# Patient Record
Sex: Male | Born: 1945 | Race: Black or African American | Hispanic: No | Smoking: Former smoker
Health system: Southern US, Community
[De-identification: ages and names within clinical notes are randomized; demographics above are authoritative.]

## PROBLEM LIST (undated history)

## (undated) DIAGNOSIS — I10 Essential (primary) hypertension: Secondary | ICD-10-CM

## (undated) DIAGNOSIS — G609 Hereditary and idiopathic neuropathy, unspecified: Secondary | ICD-10-CM

## (undated) DIAGNOSIS — M545 Low back pain, unspecified: Secondary | ICD-10-CM

## (undated) DIAGNOSIS — R413 Other amnesia: Secondary | ICD-10-CM

## (undated) DIAGNOSIS — E785 Hyperlipidemia, unspecified: Secondary | ICD-10-CM

## (undated) DIAGNOSIS — K219 Gastro-esophageal reflux disease without esophagitis: Secondary | ICD-10-CM

## (undated) DIAGNOSIS — IMO0001 Reserved for inherently not codable concepts without codable children: Secondary | ICD-10-CM

## (undated) DIAGNOSIS — M19019 Primary osteoarthritis, unspecified shoulder: Secondary | ICD-10-CM

## (undated) DIAGNOSIS — E559 Vitamin D deficiency, unspecified: Secondary | ICD-10-CM

## (undated) DIAGNOSIS — E1165 Type 2 diabetes mellitus with hyperglycemia: Secondary | ICD-10-CM

## (undated) DIAGNOSIS — Z8739 Personal history of other diseases of the musculoskeletal system and connective tissue: Secondary | ICD-10-CM

## (undated) HISTORY — DX: Type 2 diabetes mellitus with hyperglycemia: E11.65

## (undated) HISTORY — DX: Other amnesia: R41.3

## (undated) HISTORY — DX: Low back pain: M54.5

## (undated) HISTORY — DX: Low back pain, unspecified: M54.50

## (undated) HISTORY — DX: Hereditary and idiopathic neuropathy, unspecified: G60.9

## (undated) HISTORY — DX: Primary osteoarthritis, unspecified shoulder: M19.019

## (undated) HISTORY — DX: Reserved for inherently not codable concepts without codable children: IMO0001

## (undated) HISTORY — DX: Hyperlipidemia, unspecified: E78.5

## (undated) HISTORY — DX: Vitamin D deficiency, unspecified: E55.9

## (undated) HISTORY — DX: Gastro-esophageal reflux disease without esophagitis: K21.9

## (undated) HISTORY — DX: Essential (primary) hypertension: I10

---

## 2003-01-25 HISTORY — PX: COLONOSCOPY: SHX174

## 2009-12-15 ENCOUNTER — Emergency Department (HOSPITAL_COMMUNITY)
Admission: EM | Admit: 2009-12-15 | Discharge: 2009-12-15 | Payer: Self-pay | Source: Home / Self Care | Admitting: Emergency Medicine

## 2010-01-24 HISTORY — PX: COLONOSCOPY W/ POLYPECTOMY: SHX1380

## 2011-01-25 HISTORY — PX: KIDNEY STONE SURGERY: SHX686

## 2011-02-15 DIAGNOSIS — B353 Tinea pedis: Secondary | ICD-10-CM | POA: Diagnosis not present

## 2011-02-15 DIAGNOSIS — Z8042 Family history of malignant neoplasm of prostate: Secondary | ICD-10-CM | POA: Diagnosis not present

## 2011-02-15 DIAGNOSIS — Z0000 Encounter for general adult medical examination without abnormal findings: Secondary | ICD-10-CM | POA: Diagnosis not present

## 2011-02-15 DIAGNOSIS — E785 Hyperlipidemia, unspecified: Secondary | ICD-10-CM | POA: Diagnosis not present

## 2011-02-15 DIAGNOSIS — I10 Essential (primary) hypertension: Secondary | ICD-10-CM | POA: Diagnosis not present

## 2011-02-15 DIAGNOSIS — Z79899 Other long term (current) drug therapy: Secondary | ICD-10-CM | POA: Diagnosis not present

## 2011-02-15 DIAGNOSIS — Z86010 Personal history of colonic polyps: Secondary | ICD-10-CM | POA: Diagnosis not present

## 2011-02-15 DIAGNOSIS — E119 Type 2 diabetes mellitus without complications: Secondary | ICD-10-CM | POA: Diagnosis not present

## 2011-02-15 DIAGNOSIS — Z125 Encounter for screening for malignant neoplasm of prostate: Secondary | ICD-10-CM | POA: Diagnosis not present

## 2011-10-11 DIAGNOSIS — I10 Essential (primary) hypertension: Secondary | ICD-10-CM | POA: Diagnosis not present

## 2011-10-11 DIAGNOSIS — E785 Hyperlipidemia, unspecified: Secondary | ICD-10-CM | POA: Diagnosis not present

## 2011-10-11 DIAGNOSIS — M19019 Primary osteoarthritis, unspecified shoulder: Secondary | ICD-10-CM | POA: Diagnosis not present

## 2011-11-08 DIAGNOSIS — E559 Vitamin D deficiency, unspecified: Secondary | ICD-10-CM | POA: Diagnosis not present

## 2011-11-08 DIAGNOSIS — E785 Hyperlipidemia, unspecified: Secondary | ICD-10-CM | POA: Diagnosis not present

## 2011-11-08 DIAGNOSIS — I10 Essential (primary) hypertension: Secondary | ICD-10-CM | POA: Diagnosis not present

## 2011-11-10 DIAGNOSIS — E785 Hyperlipidemia, unspecified: Secondary | ICD-10-CM | POA: Diagnosis not present

## 2011-11-10 DIAGNOSIS — Z23 Encounter for immunization: Secondary | ICD-10-CM | POA: Diagnosis not present

## 2011-11-10 DIAGNOSIS — I10 Essential (primary) hypertension: Secondary | ICD-10-CM | POA: Diagnosis not present

## 2011-11-10 DIAGNOSIS — E559 Vitamin D deficiency, unspecified: Secondary | ICD-10-CM | POA: Diagnosis not present

## 2012-03-28 DIAGNOSIS — I10 Essential (primary) hypertension: Secondary | ICD-10-CM | POA: Diagnosis not present

## 2012-03-28 DIAGNOSIS — M19019 Primary osteoarthritis, unspecified shoulder: Secondary | ICD-10-CM | POA: Diagnosis not present

## 2012-05-13 ENCOUNTER — Emergency Department (HOSPITAL_COMMUNITY)
Admission: EM | Admit: 2012-05-13 | Discharge: 2012-05-13 | Discharge status: Against Medical Advice | Payer: Self-pay | Attending: Emergency Medicine | Admitting: Emergency Medicine

## 2012-05-13 DIAGNOSIS — Y939 Activity, unspecified: Secondary | ICD-10-CM | POA: Insufficient documentation

## 2012-05-13 DIAGNOSIS — F1010 Alcohol abuse, uncomplicated: Secondary | ICD-10-CM | POA: Insufficient documentation

## 2012-05-13 DIAGNOSIS — Y9289 Other specified places as the place of occurrence of the external cause: Secondary | ICD-10-CM | POA: Insufficient documentation

## 2012-05-13 DIAGNOSIS — X58XXXA Exposure to other specified factors, initial encounter: Secondary | ICD-10-CM | POA: Insufficient documentation

## 2012-05-13 DIAGNOSIS — S0190XA Unspecified open wound of unspecified part of head, initial encounter: Secondary | ICD-10-CM | POA: Insufficient documentation

## 2012-05-13 MED ORDER — TETANUS-DIPHTH-ACELL PERTUSSIS 5-2.5-18.5 LF-MCG/0.5 IM SUSP
0.5000 mL | Freq: Once | INTRAMUSCULAR | Status: DC
  Filled 2012-05-13: qty 0.5

## 2012-05-13 MED ORDER — ZIPRASIDONE MESYLATE 20 MG IM SOLR
10.0000 mg | Freq: Once | INTRAMUSCULAR | Status: DC
  Filled 2012-05-13 (×2): qty 20

## 2012-05-13 NOTE — ED Notes (Signed)
Per EMS. Patient was found by security in a parking lot. Patient reports to EMS that he was looking for his car and does not recall falling. Patient accompanied by [employer] at this time. Patient A&O by uncooperative. Right side head lac.

## 2012-05-13 NOTE — ED Notes (Signed)
GEX:[id number]<XL> Expected date:<BR> Expected time:<BR> Means of arrival:<BR> Comments:<BR> EMS etoh, head lac

## 2012-05-27 ENCOUNTER — Other Ambulatory Visit: Payer: Self-pay | Admitting: Internal Medicine

## 2012-07-30 ENCOUNTER — Other Ambulatory Visit: Payer: Self-pay | Admitting: *Deleted

## 2012-07-30 ENCOUNTER — Encounter: Payer: Self-pay | Admitting: *Deleted

## 2012-07-31 ENCOUNTER — Ambulatory Visit: Payer: Self-pay | Admitting: Internal Medicine

## 2012-07-31 DIAGNOSIS — Z0289 Encounter for other administrative examinations: Secondary | ICD-10-CM

## 2012-09-12 ENCOUNTER — Other Ambulatory Visit: Payer: Self-pay | Admitting: Internal Medicine

## 2012-10-15 ENCOUNTER — Other Ambulatory Visit: Payer: Self-pay | Admitting: Nurse Practitioner

## 2012-10-24 ENCOUNTER — Other Ambulatory Visit: Payer: Self-pay | Admitting: Internal Medicine

## 2012-11-13 ENCOUNTER — Encounter: Payer: Self-pay | Admitting: Internal Medicine

## 2012-11-13 ENCOUNTER — Ambulatory Visit (INDEPENDENT_AMBULATORY_CARE_PROVIDER_SITE_OTHER): Payer: Medicare Other | Admitting: Internal Medicine

## 2012-11-13 VITALS — BP 126/80 | HR 81 | Temp 99.4°F | Ht 68.03 in | Wt 183.0 lb

## 2012-11-13 DIAGNOSIS — I119 Hypertensive heart disease without heart failure: Secondary | ICD-10-CM

## 2012-11-13 DIAGNOSIS — E1142 Type 2 diabetes mellitus with diabetic polyneuropathy: Secondary | ICD-10-CM | POA: Diagnosis not present

## 2012-11-13 DIAGNOSIS — M1990 Unspecified osteoarthritis, unspecified site: Secondary | ICD-10-CM

## 2012-11-13 DIAGNOSIS — E1149 Type 2 diabetes mellitus with other diabetic neurological complication: Secondary | ICD-10-CM

## 2012-11-13 DIAGNOSIS — E1140 Type 2 diabetes mellitus with diabetic neuropathy, unspecified: Secondary | ICD-10-CM | POA: Insufficient documentation

## 2012-11-13 DIAGNOSIS — E559 Vitamin D deficiency, unspecified: Secondary | ICD-10-CM | POA: Diagnosis not present

## 2012-11-13 DIAGNOSIS — E785 Hyperlipidemia, unspecified: Secondary | ICD-10-CM | POA: Diagnosis not present

## 2012-11-13 DIAGNOSIS — Z23 Encounter for immunization: Secondary | ICD-10-CM | POA: Diagnosis not present

## 2012-11-13 MED ORDER — MELOXICAM 15 MG PO TABS: 15.0000 mg | ORAL_TABLET | Freq: Every day | ORAL | Status: DC

## 2012-11-13 MED ORDER — GABAPENTIN 100 MG PO CAPS: 100.0000 mg | ORAL_CAPSULE | Freq: Every day | ORAL | Status: DC

## 2012-11-13 NOTE — Progress Notes (Signed)
Patient ID: Danny Winters, male   DOB: [birth year], 67 y.o.   MRN: [id number]  Chief Complaint  Patient presents with  . Medical Managment of Chronic Issues    Follow-up on arthritis   . Immunizations    ? Flu Vaccine, patient with low-grade fever today     Allergies  Allergen Reactions  . Lisinopril     HPI Patient here for follow up visit. i am seeing him in 7 months. He missed follow up appointment in between  His arthritis has been acting up. Denies any redness or swelling but pain has been bothering him. He applies some cream and this provides some help. He has ran out of his medications- meloxicam, aspirin and gabapentin for a month. He is taking his losartan, metformin and simvastatin cbg this am was 115. cbg has been ranging between 86-125. Normal urine microalbumin in past  Review of Systems  Constitutional: Negative for fever, chills, weight loss, malaise/fatigue and diaphoresis.  HENT: Negative for congestion, ear discharge, ear pain, hearing loss, nosebleeds, sore throat and tinnitus.   Eyes: Negative for blurred vision and double vision.  Respiratory: Negative for cough, hemoptysis, sputum production, shortness of breath, wheezing and stridor.   Cardiovascular: Negative for chest pain, palpitations, orthopnea, claudication, leg swelling and PND.  Gastrointestinal: Negative for heartburn, nausea, vomiting, abdominal pain, diarrhea and constipation.  Genitourinary: Negative for dysuria, urgency and frequency.  Musculoskeletal: Positive for back pain and joint pain. Negative for falls and myalgias.  Skin: Negative for itching and rash.  Neurological: Negative for dizziness, tingling, tremors, sensory change, loss of consciousness, weakness and headaches.  Psychiatric/Behavioral: Negative for depression and suicidal ideas. The patient is not nervous/anxious and does not have insomnia.    Past Medical History  Diagnosis Date  . Unspecified vitamin D deficiency   .  Unspecified hereditary and idiopathic peripheral neuropathy   . Type II or unspecified type diabetes mellitus without mention of complication, uncontrolled   . Other and unspecified hyperlipidemia   . Unspecified essential hypertension   . Unspecified arthropathy, shoulder region   . Lumbago   . Memory loss    Past Surgical History  Procedure Laterality Date  . Colonoscopy w/ polypectomy  2012    [facility]- sessile polyp in transverse colon 4 mm and another 6 mm   Current Outpatient Prescriptions on File Prior to Visit  Medication Sig Dispense Refill  . aspirin 81 MG tablet Take 81 mg by mouth daily.      . Lancets (ONETOUCH ULTRASOFT) lancets USE TO CHECK BLOODSUGAR ONCE DAILY  100 each  3  . losartan (COZAAR) 25 MG tablet TAKE 1 TABLET BY MOUTH EVERY DAY TO CONTROL BLLOD PRESSURE  30 tablet  5  . metFORMIN (GLUCOPHAGE) 1000 MG tablet TAKE 1 TABLET BY MOUTH TWICE A DAY FOR BLOOD SUGAR  60 tablet  4  . simvastatin (ZOCOR) 10 MG tablet TAKE 1 TABLET BY MOUTH EVERY DAY  30 tablet  5  . Vitamin D, Ergocalciferol, (DRISDOL) 50000 UNITS CAPS Take one capsule once weekly       No current facility-administered medications on file prior to visit.   Physical exam  BP 126/80  Pulse 81  Temp(Src) 99.4 F (37.4 C) (Oral)  Ht 5' 8.03" (1.728 m)  Wt 183 lb (83.008 kg)  BMI 27.8 kg/m2  SpO2 96%  General- elderly male in no acute distress Head- atraumatic, normocephalic Eyes- PERRLA, EOMI, no pallor, no icterus Neck- no lymphadenopathy, no thyromegaly, no jugular vein  distension, no carotid bruit Chest- no chest wall deformities, no chest wall tenderness Cardiovascular- normal s1,s2, no rubs/ gallops, has grade  2/6 systolic murmur Respiratory- bilateral clear to auscultation, no wheeze, no rhonchi, no crackles Abdomen- bowel sounds present, soft, non tender, no organomegaly, no abdominal bruits, no guarding or rigidity, no CVA tenderness Musculoskeletal- able to move all 4  extremities, no spinal and paraspinal tenderness, steady gait, no use of assistive device Neurological- no focal deficit, normal reflexes, normal muscle strength, normal sensation to fine touch and vibration Psychiatry- alert and oriented to person, place and time, normal mood and affect  Labs- Will get lab work today  Assessment/plan  OA- will have him restarted on meloxicam and will increase it to 15 mg daily, reassess if no improvement  Dm type 2 - check a1c today. Continue metformin 1000 mg bid with aspirin and losartan. bp well controlled. Also check lipid panel today. Monitor cbg  Peripheral neuropath in setting of dm- continue gabapentin. Refill provided  Hypertension- bp controlled this viist. Continue losartan  Hyperlipidemia- ldl goal < 100. Check flp. Continue current dose simvastatin  Vit d def- continue vit d supplement, encouraged exercise  Pneumococcal and influenza vaccine provided today

## 2012-11-14 DIAGNOSIS — Z23 Encounter for immunization: Secondary | ICD-10-CM | POA: Insufficient documentation

## 2012-11-29 LAB — LIPID PANEL
Cholesterol, Total: 122 mg/dL (ref 100–199)
HDL: 38 mg/dL — ABNORMAL LOW (ref >39)
LDL Calculated: 69 mg/dL (ref 0–99)
Triglycerides: 76 mg/dL (ref 0–149)
VLDL Cholesterol Cal: 15 mg/dL (ref 5–40)

## 2012-11-29 LAB — COMPREHENSIVE METABOLIC PANEL
ALT: 14 IU/L (ref 0–44)
Albumin: 4.4 g/dL (ref 3.6–4.8)
BUN: 17 mg/dL (ref 8–27)
CO2: 26 mmol/L (ref 18–29)
Calcium: 9.6 mg/dL (ref 8.6–10.2)
Chloride: 100 mmol/L (ref 97–108)
Glucose: 89 mg/dL (ref 65–99)
Potassium: 4.5 mmol/L (ref 3.5–5.2)
Total Protein: 6.8 g/dL (ref 6.0–8.5)

## 2012-11-29 LAB — CBC WITH DIFFERENTIAL/PLATELET
Basophils Absolute: 0 10*3/uL (ref 0.0–0.2)
Basos: 0 %
Eosinophils Absolute: 0.1 10*3/uL (ref 0.0–0.4)
Immature Grans (Abs): 0 10*3/uL (ref 0.0–0.1)
MCH: 29 pg (ref 26.6–33.0)
MCHC: 34.5 g/dL (ref 31.5–35.7)
MCV: 84 fL (ref 79–97)
Monocytes Absolute: 0.5 10*3/uL (ref 0.1–0.9)
Neutrophils Relative %: 46 %
RBC: 5.04 x10E6/uL (ref 4.14–5.80)
RDW: 14.3 % (ref 12.3–15.4)

## 2012-11-29 LAB — MICROALBUMIN / CREATININE URINE RATIO
MICROALB/CREAT RATIO: 1.8 mg/g creat (ref 0.0–30.0)
Microalbumin, Urine: 3.2 ug/mL (ref 0.0–17.0)

## 2012-12-06 ENCOUNTER — Encounter: Payer: Self-pay | Admitting: Internal Medicine

## 2013-02-13 ENCOUNTER — Ambulatory Visit: Payer: Medicare Other | Admitting: Internal Medicine

## 2013-02-13 DIAGNOSIS — Z0289 Encounter for other administrative examinations: Secondary | ICD-10-CM

## 2013-02-20 ENCOUNTER — Other Ambulatory Visit: Payer: Self-pay | Admitting: Internal Medicine

## 2013-03-18 ENCOUNTER — Other Ambulatory Visit: Payer: Self-pay | Admitting: Internal Medicine

## 2013-03-22 ENCOUNTER — Other Ambulatory Visit: Payer: Self-pay | Admitting: Internal Medicine

## 2013-04-10 ENCOUNTER — Ambulatory Visit: Payer: Medicare Other | Admitting: Nurse Practitioner

## 2013-04-17 ENCOUNTER — Ambulatory Visit (INDEPENDENT_AMBULATORY_CARE_PROVIDER_SITE_OTHER): Payer: Medicare Other | Admitting: Nurse Practitioner

## 2013-04-17 ENCOUNTER — Encounter: Payer: Self-pay | Admitting: Nurse Practitioner

## 2013-04-17 VITALS — BP 140/90 | HR 77 | Temp 97.0°F | Resp 18 | Ht 68.0 in | Wt 187.0 lb

## 2013-04-17 DIAGNOSIS — E785 Hyperlipidemia, unspecified: Secondary | ICD-10-CM

## 2013-04-17 DIAGNOSIS — E1142 Type 2 diabetes mellitus with diabetic polyneuropathy: Secondary | ICD-10-CM

## 2013-04-17 DIAGNOSIS — E559 Vitamin D deficiency, unspecified: Secondary | ICD-10-CM

## 2013-04-17 DIAGNOSIS — E1149 Type 2 diabetes mellitus with other diabetic neurological complication: Secondary | ICD-10-CM | POA: Diagnosis not present

## 2013-04-17 DIAGNOSIS — E1165 Type 2 diabetes mellitus with hyperglycemia: Principal | ICD-10-CM

## 2013-04-17 DIAGNOSIS — I119 Hypertensive heart disease without heart failure: Secondary | ICD-10-CM

## 2013-04-17 DIAGNOSIS — IMO0001 Reserved for inherently not codable concepts without codable children: Secondary | ICD-10-CM | POA: Diagnosis not present

## 2013-04-17 DIAGNOSIS — E1140 Type 2 diabetes mellitus with diabetic neuropathy, unspecified: Secondary | ICD-10-CM

## 2013-04-17 NOTE — Progress Notes (Signed)
Patient ID: Danny Winters, male   DOB: [birth year], 68 y.o.   MRN: [id number]    Allergies  Allergen Reactions  . Lisinopril     Chief Complaint  Patient presents with  . Follow-up    DM, HTN, Arthritis    HPI: Patient is a 68 y.o. male seen in the office today for routine follow up; has missed several appts in the past, and needed a follow up; taking all medication as prescribed per pt Reports he takes his blood sugar but did not bring log, this morning he reports it was 109, yesterday 89 Range from 90s-137s; reports he is following diabetic diet and walking 1 day a week 10-15; numbness and tingling in legs is well controlled on gabapentin.  Did not take blood pressure medication this morning Review of Systems:  Review of Systems  Constitutional: Negative for fever, chills, weight loss and malaise/fatigue.  HENT: Negative for nosebleeds and sore throat.   Eyes: Negative for blurred vision.  Respiratory: Negative for cough and shortness of breath.   Cardiovascular: Negative for chest pain, palpitations and leg swelling.  Gastrointestinal: Negative for heartburn, abdominal pain, diarrhea and constipation.  Genitourinary: Positive for frequency (at night; gets up about 1-4 times, sometimes not at all). Negative for dysuria.  Musculoskeletal: Positive for neck pain. Negative for back pain, falls and myalgias.  Skin: Negative.   Neurological: Negative for dizziness, tingling, sensory change, weakness and headaches.  Psychiatric/Behavioral: Positive for memory loss (per wife). Negative for depression and suicidal ideas. The patient is not nervous/anxious and does not have insomnia.      Past Medical History  Diagnosis Date  . Unspecified vitamin D deficiency   . Unspecified hereditary and idiopathic peripheral neuropathy   . Type II or unspecified type diabetes mellitus without mention of complication, uncontrolled   . Other and unspecified hyperlipidemia   . Unspecified essential  hypertension   . Unspecified arthropathy, shoulder region   . Lumbago   . Memory loss    Past Surgical History  Procedure Laterality Date  . Colonoscopy w/ polypectomy  2012    [facility]- sessile polyp in transverse colon 4 mm and another 6 mm   Social History:   reports that he has quit smoking. He does not have any smokeless tobacco history on file. He reports that he does not drink alcohol or use illicit drugs.  Family History  Problem Relation Age of Onset  . Heart disease Mother   . Heart attack Father   . Cancer Sister     colon  . Cancer Brother     colon  . Cancer Brother     colon  . Cancer Brother     prostate  . Cancer Brother     prostate  . Cancer Brother     Pancreas    Medications: Patient's Medications  New Prescriptions   No medications on file  Previous Medications   ASPIRIN 81 MG TABLET    Take 81 mg by mouth daily.   GABAPENTIN (NEURONTIN) 100 MG CAPSULE    Take 1 capsule (100 mg total) by mouth daily. Take one capsule once daily for pains   LANCETS (ONETOUCH ULTRASOFT) LANCETS    USE TO CHECK BLOODSUGAR ONCE DAILY   LOSARTAN (COZAAR) 25 MG TABLET    APPOINTMENT OVERDUE, 1 by mouth daily to control blood pressure   MELOXICAM (MOBIC) 15 MG TABLET    Take 1 tablet (15 mg total) by mouth daily. T   METFORMIN (  GLUCOPHAGE) 1000 MG TABLET    TAKE 1 TABLET BY MOUTH TWICE A DAY FOR BLOOD SUGAR   SIMVASTATIN (ZOCOR) 10 MG TABLET    APPOINTMENT OVERDUE, 1 by mouth daily for cholesterol  Modified Medications   No medications on file  Discontinued Medications   VITAMIN D, ERGOCALCIFEROL, (DRISDOL) 50000 UNITS CAPS    Take one capsule once weekly     Physical Exam:  Filed Vitals:   04/17/13 0846  BP: 140/90  Pulse: 77  Temp: 97 F (36.1 C)  TempSrc: Oral  Resp: 18  Height: 5\' 8"  (1.727 m)  Weight: 187 lb (84.823 kg)  SpO2: 99%    Physical Exam  Constitutional: He is oriented to person, place, and time and well-developed, well-nourished,  and in no distress.  HENT:  Head: Normocephalic and atraumatic.  Mouth/Throat: Oropharynx is clear and moist. No oropharyngeal exudate.  Eyes: Conjunctivae and EOM are normal. Pupils are equal, round, and reactive to light.  Neck: Normal range of motion. Neck supple. No thyromegaly present.  Cardiovascular: Normal rate, regular rhythm and normal heart sounds.   Pulmonary/Chest: Effort normal and breath sounds normal. No respiratory distress.  Abdominal: Soft. Bowel sounds are normal. He exhibits no distension.  Musculoskeletal: He exhibits no edema and no tenderness.  Neurological: He is alert and oriented to person, place, and time. Gait normal.  Skin: Skin is warm and dry. No erythema.  Psychiatric: Affect normal.     Labs reviewed: Basic Metabolic Panel:  Recent Labs  16/10/96 1304  NA 140  K 4.5  CL 100  CO2 26  GLUCOSE 89  BUN 17  CREATININE 1.05  CALCIUM 9.6   Liver Function Tests:  Recent Labs  11/13/12 1304  AST 20  ALT 14  ALKPHOS 55  BILITOT 0.7  PROT 6.8   No results found for this basename: LIPASE, AMYLASE,  in the last 8760 hours No results found for this basename: AMMONIA,  in the last 8760 hours CBC:  Recent Labs  11/13/12 1304  WBC 4.8  NEUTROABS 2.2  HGB 14.6  HCT 42.3  MCV 84   Lipid Panel:  Recent Labs  11/13/12 1304  HDL 38*  LDLCALC 69  TRIG 76  CHOLHDL 3.2   TSH: No results found for this basename: TSH,  in the last 8760 hours A1C: Lab Results  Component Value Date   HGBA1C 7.7* 11/13/2012     Assessment/Plan  1. Vitamin D deficiency -was previously on medication for vit D def, now not on any supplements, no recent vit D level; will follow up at this time.  - Vitamin D, 25-hydroxy  2. Hyperlipidemia LDL goal <100 -LDL was at goal in oct, conts zocor  3. Diabetic neuropathy -remains stable; without worsening of symptoms, conts on gabapentin  4. Benign hypertensive heart disease without heart failure -did not  take medications today but not blood pressure not greatly elevated; will cont cozaar -will get lab to follow up electrolytes and kidney function  5. Type II or unspecified type diabetes mellitus without mention of complication, uncontrolled -pt reports compliance with medication and better diet however does not bring log today -will get  Hemoglobin A1c if this has NOT improved will have pt schedule appt with [facility]; pt missed last appt -foot exam normal today, micro albumin done last visit  -encouraged diet modification and increase exercise to 30 mins 5 days a week - Basic metabolic panel  Pt to follow up with Dr Glade Lloyd in  3 months for EV with MMSE (pt afraid of memory loss) and to discuss future screenings

## 2013-04-17 NOTE — Patient Instructions (Signed)
Diabetic diet and increase activity- recommend  30 mins 5 days a week of cardiovascular activity  You will need to schedule a physical with Dr Glade Lloyd in 3 months   Cardiac Diet This diet can help prevent heart disease and stroke. Many factors influence your heart health, including eating and exercise habits. Coronary risk rises a lot with abnormal blood fat (lipid) levels. Cardiac meal planning includes limiting unhealthy fats, increasing healthy fats, and making other small dietary changes. General guidelines are as follows:  Adjust calorie intake to reach and maintain desirable body weight.  Limit total fat intake to less than 30% of total calories. Saturated fat should be less than 7% of calories.  Saturated fats are found in animal products and in some vegetable products. Saturated vegetable fats are found in coconut oil, cocoa butter, palm oil, and palm kernel oil. Read labels carefully to avoid these products as much as possible. Use butter in moderation. Choose tub margarines and oils that have 2 grams of fat or less. Good cooking oils are canola and olive oils.  Practice low-fat cooking techniques. Do not fry food. Instead, broil, bake, boil, steam, grill, roast on a rack, stir-fry, or microwave it. Other fat reducing suggestions include:  Remove the skin from poultry.  Remove all visible fat from meats.  Skim the fat off stews, soups, and gravies before serving them.  Steam vegetables in water or broth instead of sauting them in fat.  Avoid foods with trans fat (or hydrogenated oils), such as commercially fried foods and commercially baked goods. Commercial shortening and deep-frying fats will contain trans fat.  Increase intake of fruits, vegetables, whole grains, and legumes to replace foods high in fat.  Increase consumption of nuts, legumes, and seeds to at least 4 servings weekly. One serving of a legume equals  cup, and 1 serving of nuts or seeds equals  cup.  Choose  whole grains more often. Have 3 servings per day (a serving is 1 ounce [oz]).  Eat 4 to 5 servings of vegetables per day. A serving of vegetables is 1 cup of raw leafy vegetables;  cup of raw or cooked cut-up vegetables;  cup of vegetable juice.  Eat 4 to 5 servings of fruit per day. A serving of fruit is 1 medium whole fruit;  cup of dried fruit;  cup of fresh, frozen, or canned fruit;  cup of 100% fruit juice.  Increase your intake of dietary fiber to 20 to 30 grams per day. Insoluble fiber may help lower your risk of heart disease and may help curb your appetite.  Soluble fiber binds cholesterol to be removed from the blood. Foods high in soluble fiber are dried beans, citrus fruits, oats, apples, bananas, broccoli, Brussels sprouts, and eggplant.  Try to include foods fortified with plant sterols or stanols, such as yogurt, breads, juices, or margarines. Choose several fortified foods to achieve a daily intake of 2 to 3 grams of plant sterols or stanols.  Foods with omega-3 fats can help reduce your risk of heart disease. Aim to have a 3.5 oz portion of fatty fish twice per week, such as salmon, mackerel, albacore tuna, sardines, lake trout, or herring. If you wish to take a fish oil supplement, choose one that contains 1 gram of both DHA and EPA.  Limit processed meats to 2 servings (3 oz portion) weekly.  Limit the sodium in your diet to 1500 milligrams (mg) per day. If you have high blood pressure, talk to a  registered dietitian about a DASH (Dietary Approaches to Stop Hypertension) eating plan.  Limit sweets and beverages with added sugar, such as soda, to no more than 5 servings per week. One serving is:   1 tablespoon sugar.  1 tablespoon jelly or jam.   cup sorbet.  1 cup lemonade.   cup regular soda. CHOOSING FOODS Starches  Allowed: Breads: All kinds (wheat, rye, raisin, white, oatmeal, [place], [place], and English muffin bread). Low-fat rolls: English muffins,  frankfurter and hamburger buns, bagels, pita bread, tortillas (not fried). Pancakes, waffles, biscuits, and muffins made with recommended oil.  Avoid: Products made with saturated or trans fats, oils, or whole milk products. Butter rolls, cheese breads, croissants. Commercial doughnuts, muffins, sweet rolls, biscuits, waffles, pancakes, store-bought mixes. Crackers  Allowed: Low-fat crackers and snacks: Animal, graham, rye, saltine (with recommended oil, no lard), oyster, and matzo crackers. Bread sticks, melba toast, rusks, flatbread, pretzels, and light popcorn.  Avoid: High-fat crackers: cheese crackers, butter crackers, and those made with coconut, palm oil, or trans fat (hydrogenated oils). Buttered popcorn. Cereals  Allowed: Hot or cold whole-grain cereals.  Avoid: Cereals containing coconut, hydrogenated vegetable fat, or animal fat. Potatoes / Pasta / Rice  Allowed: All kinds of potatoes, rice, and pasta (such as macaroni, spaghetti, and noodles).  Avoid: Pasta or rice prepared with cream sauce or high-fat cheese. Chow mein noodles, [place] fries. Vegetables  Allowed: All vegetables and vegetable juices.  Avoid: Fried vegetables. Vegetables in cream, butter, or high-fat cheese sauces. Limit coconut. Fruit in cream or custard. Protein  Allowed: Limit your intake of meat, seafood, and poultry to no more than 6 oz (cooked weight) per day. All lean, well-trimmed beef, veal, pork, and lamb. All chicken and [place] without skin. All fish and shellfish. Wild game: wild duck, rabbit, pheasant, and venison. Egg whites or low-cholesterol egg substitutes may be used as desired. Meatless dishes: recipes with dried beans, peas, lentils, and tofu (soybean curd). Seeds and nuts: all seeds and most nuts.  Avoid: Prime grade and other heavily marbled and fatty meats, such as short ribs, spare ribs, rib eye roast or steak, frankfurters, sausage, bacon, and high-fat luncheon meats, mutton. Caviar.  Commercially fried fish. Domestic duck, goose, venison sausage. Organ meats: liver, gizzard, heart, chitterlings, brains, kidney, sweetbreads. Dairy  Allowed: Low-fat cheeses: nonfat or low-fat cottage cheese (1% or 2% fat), cheeses made with part skim milk, such as mozzarella, farmers, string, or ricotta. (Cheeses should be labeled no more than 2 to 6 grams fat per oz.). Skim (or 1%) milk: liquid, powdered, or evaporated. Buttermilk made with low-fat milk. Drinks made with skim or low-fat milk or cocoa. Chocolate milk or cocoa made with skim or low-fat (1%) milk. Nonfat or low-fat yogurt.  Avoid: Whole milk cheeses, including colby, cheddar, [place], [address], [place], [place], [place]ember[place], [address], Swiss, and blue. Creamed cottage cheese, cream cheese. Whole milk and whole milk products, including buttermilk or yogurt made from whole milk, drinks made from whole milk. Condensed milk, evaporated whole milk, and 2% milk. Soups and Combination Foods  Allowed: Low-fat low-sodium soups: broth, dehydrated soups, homemade broth, soups with the fat removed, homemade cream soups made with skim or low-fat milk. Low-fat spaghetti, lasagna, chili, and Spanish rice if low-fat ingredients and low-fat cooking techniques are used.  Avoid: Cream soups made with whole milk, cream, or high-fat cheese. All other soups. Desserts and Sweets  Allowed: Sherbet, fruit ices, gelatins, meringues, and angel food cake. Homemade desserts with recommended fats, oils, and  milk products. Jam, jelly, honey, marmalade, sugars, and syrups. Pure sugar candy, such as gum drops, hard candy, jelly beans, marshmallows, mints, and small amounts of dark chocolate.  Avoid: Commercially prepared cakes, pies, cookies, frosting, pudding, or mixes for these products. Desserts containing whole milk products, chocolate, coconut, lard, palm oil, or palm kernel oil. Ice cream or ice cream drinks. Candy that contains chocolate, coconut, butter,  hydrogenated fat, or unknown ingredients. Buttered syrups. Fats and Oils  Allowed: Vegetable oils: safflower, sunflower, corn, soybean, cottonseed, sesame, canola, olive, or peanut. Non-hydrogenated margarines. Salad dressing or mayonnaise: homemade or commercial, made with a recommended oil. Low or nonfat salad dressing or mayonnaise.  Limit added fats and oils to 6 to 8 tsp per day (includes fats used in cooking, baking, salads, and spreads on bread). Remember to count the "hidden fats" in foods.  Avoid: Solid fats and shortenings: butter, lard, salt pork, bacon drippings. Gravy containing meat fat, shortening, or suet. Cocoa butter, coconut. Coconut oil, palm oil, palm kernel oil, or hydrogenated oils: these ingredients are often used in bakery products, nondairy creamers, whipped toppings, candy, and commercially fried foods. Read labels carefully. Salad dressings made of unknown oils, sour cream, or cheese, such as blue cheese and Roquefort. Cream, all kinds: half-and-half, light, heavy, or whipping. Sour cream or cream cheese (even if "light" or low-fat). Nondairy cream substitutes: coffee creamers and sour cream substitutes made with palm, palm kernel, hydrogenated oils, or coconut oil. Beverages  Allowed: Coffee (regular or decaffeinated), tea. Diet carbonated beverages, mineral water. Alcohol: Check with your caregiver. Moderation is recommended.  Avoid: Whole milk, regular sodas, and juice drinks with added sugar. Condiments  Allowed: All seasonings and condiments. Cocoa powder. "Cream" sauces made with recommended ingredients.  Avoid: Carob powder made with hydrogenated fats. SAMPLE MENU Breakfast   cup orange juice   cup oatmeal  1 slice toast  1 tsp margarine  1 cup skim milk Lunch  [place] sandwich with 2 oz [place], 2 slices bread  Lettuce and tomato slices  Fresh fruit  Carrot sticks  Coffee or tea Snack  Fresh fruit or low-fat crackers Dinner  3 oz lean  ground beef  1 baked potato  1 tsp margarine   cup asparagus  Lettuce salad  1 tbs non-creamy dressing   cup peach slices  1 cup skim milk Document Released: 10/20/2007 Document Revised: 07/12/2011 Document Reviewed: 04/05/2011 [employer] Patient Information 2014 [place], [place].

## 2013-04-18 LAB — BASIC METABOLIC PANEL
BUN / CREAT RATIO: 16 (ref 10–22)
BUN: 17 mg/dL (ref 8–27)
CHLORIDE: 101 mmol/L (ref 97–108)
CO2: 24 mmol/L (ref 18–29)
Calcium: 9.4 mg/dL (ref 8.6–10.2)
Creatinine, Ser: 1.08 mg/dL (ref 0.76–1.27)
GFR, EST AFRICAN AMERICAN: 81 mL/min/{1.73_m2} (ref >59)
GFR, EST NON AFRICAN AMERICAN: 70 mL/min/{1.73_m2} (ref >59)
Glucose: 127 mg/dL — ABNORMAL HIGH (ref 65–99)
Potassium: 4.3 mmol/L (ref 3.5–5.2)
Sodium: 139 mmol/L (ref 134–144)

## 2013-04-18 LAB — HEMOGLOBIN A1C
Est. average glucose Bld gHb Est-mCnc: 169 mg/dL
Hgb A1c MFr Bld: 7.5 % — ABNORMAL HIGH (ref 4.8–5.6)

## 2013-04-18 LAB — VITAMIN D 25 HYDROXY (VIT D DEFICIENCY, FRACTURES): Vit D, 25-Hydroxy: 11.9 ng/mL — ABNORMAL LOW (ref 30.0–100.0)

## 2013-04-19 ENCOUNTER — Other Ambulatory Visit: Payer: Self-pay | Admitting: Internal Medicine

## 2013-04-22 ENCOUNTER — Encounter: Payer: Self-pay | Admitting: *Deleted

## 2013-04-23 ENCOUNTER — Telehealth: Payer: Self-pay

## 2013-04-23 MED ORDER — VITAMIN D (ERGOCALCIFEROL) 1.25 MG (50000 UNIT) PO CAPS: 50000.0000 [IU] | ORAL_CAPSULE | ORAL | Status: DC

## 2013-04-23 NOTE — Telephone Encounter (Signed)
Patient aware of lab results and verbalized understanding of results. Patient aware rx sent to pharmacy, appointment scheduled with Ronal Fearathy Coach for 05/06/13

## 2013-04-23 NOTE — Telephone Encounter (Signed)
Message copied by Maurice SmallBEATTY, Harlea Goetzinger C on Tue Apr 23, 2013 10:50 AM ------      Message from: Claudie ReveringKARAM, JESSICA M      Created: Thu Apr 18, 2013  9:06 PM       Pt is Vit D Deficient will need RX to take Vit d 50,000 units by mouth weekly- please send to pharm of pts choice; A1c not a goal, has improved since last time but not enough, will need to make appt to see Edison Paceathey Schnitzler for ongoing diabetic education and management; please have pt make appt. Thanks ------

## 2013-04-24 ENCOUNTER — Other Ambulatory Visit: Payer: Self-pay | Admitting: Internal Medicine

## 2013-05-06 ENCOUNTER — Encounter: Payer: Self-pay | Admitting: Pharmacotherapy

## 2013-05-06 ENCOUNTER — Ambulatory Visit (INDEPENDENT_AMBULATORY_CARE_PROVIDER_SITE_OTHER): Payer: Medicare Other | Admitting: Pharmacotherapy

## 2013-05-06 VITALS — BP 136/80 | HR 87 | Resp 10 | Wt 184.0 lb

## 2013-05-06 DIAGNOSIS — I10 Essential (primary) hypertension: Secondary | ICD-10-CM

## 2013-05-06 DIAGNOSIS — IMO0001 Reserved for inherently not codable concepts without codable children: Secondary | ICD-10-CM | POA: Diagnosis not present

## 2013-05-06 DIAGNOSIS — E1165 Type 2 diabetes mellitus with hyperglycemia: Principal | ICD-10-CM

## 2013-05-06 MED ORDER — LINAGLIPTIN 5 MG PO TABS: 5.0000 mg | ORAL_TABLET | Freq: Every day | ORAL | Status: DC

## 2013-05-06 MED ORDER — GLUCOSE BLOOD VI STRP
ORAL_STRIP | Status: DC
Start: 2013-05-06 — End: 2014-04-14

## 2013-05-06 NOTE — Patient Instructions (Signed)
Start Onglyza 5mg  once daily Check blood sugar twice daily.

## 2013-05-06 NOTE — Progress Notes (Signed)
  Subjective:    Danny Winters is a 68 y.o.African American male who presents for follow-up of Type 2 diabetes mellitus.   A1c is better at 7.5%, but still above target on Metformin. Needs a new BGM.  Has not been checking BG.  No special diets.Denies skipping meals. No routine exercise. Denies problems with feet.  Does have a history of tinea pedis. Some blurry vision when reading.  Needs eye exam. Nocturia present. Denies peripheral edema.  RBG:  213mg /dl   Review of Systems A comprehensive review of systems was negative except for: Eyes: positive for blurry vision Genitourinary: positive for nocturia    Objective:    BP 136/80  Pulse 87  Resp 10  Wt 184 lb (83.462 kg)  SpO2 98%  General:  alert, cooperative and no distress  Oropharynx: normal findings: lips normal without lesions and gums healthy   Eyes:  negative findings: lids and lashes normal and conjunctivae and sclerae normal   Ears:  external ears normal        Lung: clear to auscultation bilaterally  Heart:  regular rate and rhythm     Extremities: no edema  Skin: dry     Neuro: mental status, speech normal, alert and oriented x3 and gait and station normal   Lab Review Glucose (mg/dL)  Date Value  1/19/14783/25/2015 127*  11/13/2012 89      CO2 (mmol/L)  Date Value  04/17/2013 24   11/13/2012 26      BUN (mg/dL)  Date Value  2/95/62133/25/2015 17   11/13/2012 17      Creatinine, Ser (mg/dL)  Date Value  0/86/57843/25/2015 1.08   11/13/2012 1.05        Assessment:    Diabetes Mellitus type II, under good control. A1C at goal <7% HTN at goal <140/90   Plan:    1.  Rx changes: Add Onglyza 5mg  daily.  Counseled on risk / benefit. 2.  Continue Metformin 1000mg  twice daily. 3.  Counseled on insulin resistance. 4.  Counseled on benefit of routine exercise.  Goal is 30-45 minutes 5 x week. 5.  Counseled on meal planning and nutrition goals. 6.  Counseled on foot care. 7.  Provided FreeStyle Lite BGM and  instructed on use. 8.  HTN at goal <140/90. 9.  RTC in 6 weeks.

## 2013-05-13 ENCOUNTER — Telehealth: Payer: Self-pay | Admitting: *Deleted

## 2013-05-13 NOTE — Telephone Encounter (Signed)
Patient called and stated that medication that was added at last visit with Cathy--the Onglyza is not covered by insurance and cannot afford $225. Needs it changed to something else. Please Advise.

## 2013-05-13 NOTE — Telephone Encounter (Signed)
What is covered?

## 2013-05-14 NOTE — Telephone Encounter (Signed)
LMOM to return call.

## 2013-05-16 NOTE — Telephone Encounter (Signed)
Patient will call insurance company and see what it comparable that they will cover and call us back.

## 2013-06-24 ENCOUNTER — Ambulatory Visit: Payer: Medicare Other | Admitting: Pharmacotherapy

## 2013-07-01 ENCOUNTER — Ambulatory Visit (INDEPENDENT_AMBULATORY_CARE_PROVIDER_SITE_OTHER): Payer: Medicare Other | Admitting: Pharmacotherapy

## 2013-07-01 ENCOUNTER — Encounter: Payer: Self-pay | Admitting: Pharmacotherapy

## 2013-07-01 VITALS — BP 118/78 | HR 60 | Temp 97.9°F | Wt 182.0 lb

## 2013-07-01 DIAGNOSIS — IMO0001 Reserved for inherently not codable concepts without codable children: Secondary | ICD-10-CM | POA: Diagnosis not present

## 2013-07-01 DIAGNOSIS — I10 Essential (primary) hypertension: Secondary | ICD-10-CM

## 2013-07-01 DIAGNOSIS — E1165 Type 2 diabetes mellitus with hyperglycemia: Principal | ICD-10-CM

## 2013-07-01 DIAGNOSIS — E785 Hyperlipidemia, unspecified: Secondary | ICD-10-CM

## 2013-07-01 MED ORDER — SAXAGLIPTIN HCL 5 MG PO TABS: 5.0000 mg | ORAL_TABLET | Freq: Every day | ORAL | Status: DC

## 2013-07-01 NOTE — Progress Notes (Signed)
  Subjective:    Danny Winters is a 68 y.o. male who presents for follow-up of Type 2 diabetes mellitus.   Last A1C on 04/17/13 was 7.5% Insurance not covering DM medication (Tradjenta).  Estimated CrCl 77.90ml/min Forgot to bring blood glucose meter. Self reports BG 111 this morning.  He says average 90-120 fasting.  Not checking any other times.  "Sometimes" making healthy food choices.  Usually not.  He does skip meals. Walking for exercise.  Goes to the park daily.  Recently, has not been able to - brother died, wife in the hospital. Denies problems with feet. Some blurry vision. Nocturia at times.  This has improved.    Review of Systems A comprehensive review of systems was negative except for: Eyes: positive for visual disturbance Genitourinary: positive for nocturia Endocrine: positive for diabetic symptoms including blurry vision    Objective:    BP 118/78  Pulse 60  Temp(Src) 97.9 F (36.6 C) (Oral)  Wt 182 lb (82.555 kg)  General:  alert, cooperative and no distress  Oropharynx: normal findings: lips normal without lesions and gums healthy   Eyes:  negative findings: lids and lashes normal and conjunctivae and sclerae normal   Ears:  external ears normal        Lung: clear to auscultation bilaterally  Heart:  regular rate and rhythm     Extremities: no edema  Skin: warm and dry, no hyperpigmentation, vitiligo, or suspicious lesions     Neuro: mental status, speech normal, alert and oriented x3 and gait and station normal   Lab Review Glucose (mg/dL)  Date Value  0/81/3887 127*  11/13/2012 89      CO2 (mmol/L)  Date Value  04/17/2013 24   11/13/2012 26      BUN (mg/dL)  Date Value  1/95/9747 17   11/13/2012 17      Creatinine, Ser (mg/dL)  Date Value  1/85/5015 1.08   11/13/2012 1.05        Assessment:    Diabetes Mellitus type II, under good control.  BP at goal <140/90   Plan:    1.  Rx changes: change Tradjenta to Onglyza to see  if better insurance coverage. 2.  Onglyza 5mg  daily. 3.  Continue Metformin. 4.  Counseled on nutrition goals.  5.  Exercise goal is 30-45 minutes 5 x week. 6.  BP at goal.  Continue losartan.

## 2013-07-01 NOTE — Patient Instructions (Signed)
Stop Tradjenta. Start Onglyza 5mg  daily.

## 2013-07-17 ENCOUNTER — Encounter: Payer: Self-pay | Admitting: Internal Medicine

## 2013-07-17 ENCOUNTER — Ambulatory Visit (INDEPENDENT_AMBULATORY_CARE_PROVIDER_SITE_OTHER): Payer: Medicare Other | Admitting: Internal Medicine

## 2013-07-17 VITALS — BP 132/84 | HR 82 | Temp 97.4°F | Resp 18 | Ht 68.5 in | Wt 179.2 lb

## 2013-07-17 DIAGNOSIS — E1142 Type 2 diabetes mellitus with diabetic polyneuropathy: Secondary | ICD-10-CM | POA: Diagnosis not present

## 2013-07-17 DIAGNOSIS — Z1389 Encounter for screening for other disorder: Secondary | ICD-10-CM

## 2013-07-17 DIAGNOSIS — G909 Disorder of the autonomic nervous system, unspecified: Secondary | ICD-10-CM

## 2013-07-17 DIAGNOSIS — Z136 Encounter for screening for cardiovascular disorders: Secondary | ICD-10-CM

## 2013-07-17 DIAGNOSIS — E559 Vitamin D deficiency, unspecified: Secondary | ICD-10-CM | POA: Diagnosis not present

## 2013-07-17 DIAGNOSIS — E785 Hyperlipidemia, unspecified: Secondary | ICD-10-CM

## 2013-07-17 DIAGNOSIS — E1349 Other specified diabetes mellitus with other diabetic neurological complication: Secondary | ICD-10-CM

## 2013-07-17 DIAGNOSIS — R413 Other amnesia: Secondary | ICD-10-CM | POA: Diagnosis not present

## 2013-07-17 DIAGNOSIS — G3184 Mild cognitive impairment, so stated: Secondary | ICD-10-CM | POA: Diagnosis not present

## 2013-07-17 DIAGNOSIS — E1149 Type 2 diabetes mellitus with other diabetic neurological complication: Secondary | ICD-10-CM

## 2013-07-17 DIAGNOSIS — Z363 Encounter for antenatal screening for malformations: Secondary | ICD-10-CM

## 2013-07-17 DIAGNOSIS — I119 Hypertensive heart disease without heart failure: Secondary | ICD-10-CM

## 2013-07-17 DIAGNOSIS — R351 Nocturia: Secondary | ICD-10-CM | POA: Diagnosis not present

## 2013-07-17 DIAGNOSIS — Z87891 Personal history of nicotine dependence: Secondary | ICD-10-CM | POA: Diagnosis not present

## 2013-07-17 DIAGNOSIS — E0843 Diabetes mellitus due to underlying condition with diabetic autonomic (poly)neuropathy: Secondary | ICD-10-CM

## 2013-07-17 DIAGNOSIS — Z0000 Encounter for general adult medical examination without abnormal findings: Secondary | ICD-10-CM

## 2013-07-17 MED ORDER — ZOSTER VACCINE LIVE 19400 UNT/0.65ML ~~LOC~~ SOLR: 0.6500 mL | Freq: Once | SUBCUTANEOUS | Status: DC

## 2013-07-17 MED ORDER — SIMVASTATIN 10 MG PO TABS: ORAL_TABLET | ORAL | Status: DC

## 2013-07-17 MED ORDER — MELOXICAM 15 MG PO TABS: 15.0000 mg | ORAL_TABLET | Freq: Every day | ORAL | Status: DC

## 2013-07-17 NOTE — Patient Instructions (Signed)
Stop taking onglyza for now

## 2013-07-17 NOTE — Progress Notes (Signed)
Patient ID: Danny ParmaRonnie Winters, male   DOB: [birth year], 68 y.o.   MRN: [id number]    Chief Complaint  Patient presents with  . Annual Exam    medication questions   Allergies  Allergen Reactions  . Lisinopril    HPI 68 y/o male patient is here for annual exam. His wife is present with him. He denies any complaint this visit. His wife is concerned about his memory issues. She mentions that his family has history of alzhimer's disease and she is thinking if he could have that. He has not been able to afford the onglyza with cost issue. Home cbg reading 80-120 in am. Taking his metformin. Complaint with other medications. Walking everyday for exercise for 20-30 minutes. Had a mechanical fall 2 months back when he tripped. Feels good with his mood. Sleeping well  Colonoscopy 2012- 2 polyps removed, benign, repeat in 2017  Immunization History  Administered Date(s) Administered  . Influenza,inj,Quad PF,36+ Mos 11/13/2012  . Influenza-Unspecified 11/10/2011  . Pneumococcal Conjugate-13 10/01/2008  . Pneumococcal Polysaccharide-23 11/13/2012  . Tdap 01/24/2010   Review of Systems  Constitutional: Negative for fever, chills, weight loss, malaise/fatigue and diaphoresis.  HENT: Negative for congestion, hearing loss and sore throat.   Eyes: Negative for blurred vision, double vision and discharge.  Respiratory: Negative for cough, sputum production, shortness of breath and wheezing.  smoking history present Cardiovascular: Negative for chest pain, palpitations, orthopnea and leg swelling.  Gastrointestinal: Negative for heartburn, nausea, vomiting, abdominal pain, diarrhea, melena and constipation.  Genitourinary: Negative for dysuria, urgency, frequency and flank pain. has nocturia Musculoskeletal: Negative for back pain, falls, joint pain and myalgias.  Skin: Negative for itching and rash.  Neurological: Negative for dizziness, tingling, focal weakness and headaches. has peripheral neuropathy  history Psychiatric/Behavioral: Negative for depression and memory loss. The patient is not nervous/anxious.    Past Medical History  Diagnosis Date  . Unspecified vitamin D deficiency   . Unspecified hereditary and idiopathic peripheral neuropathy   . Type II or unspecified type diabetes mellitus without mention of complication, uncontrolled   . Other and unspecified hyperlipidemia   . Unspecified essential hypertension   . Unspecified arthropathy, shoulder region   . Lumbago   . Memory loss    Current Outpatient Prescriptions on File Prior to Visit  Medication Sig Dispense Refill  . aspirin 81 MG tablet Take 81 mg by mouth daily.      Marland Kitchen. gabapentin (NEURONTIN) 100 MG capsule Take 1 capsule (100 mg total) by mouth daily. Take one capsule once daily for pains  90 capsule  3  . glucose blood (FREESTYLE LITE) test strip Use as instructed  100 each  12  . Lancets (ONETOUCH ULTRASOFT) lancets USE TO CHECK BLOODSUGAR ONCE DAILY  100 each  3  . losartan (COZAAR) 25 MG tablet TAKE 1 TABLET EVERY DAY  30 tablet  2  . metFORMIN (GLUCOPHAGE) 1000 MG tablet TAKE 1 TABLET BY MOUTH TWICE A DAY FOR BLOOD SUGAR  60 tablet  3  . saxagliptin HCl (ONGLYZA) 5 MG TABS tablet Take 1 tablet (5 mg total) by mouth daily.  30 tablet  4  . Vitamin D, Ergocalciferol, (DRISDOL) 50000 UNITS CAPS capsule Take 1 capsule (50,000 Units total) by mouth every 7 (seven) days. Take the same day each week for Vit D Deficiency  12 capsule  1   No current facility-administered medications on file prior to visit.   Past Surgical History  Procedure Laterality Date  . Colonoscopy  w/ polypectomy  2012    [facility]- sessile polyp in transverse colon 4 mm and another 6 mm  . Kidney stone surgery  2013    Dr Ihor GullyMark Ottelin  . Colonoscopy  2005    Dr Lennox GrumblesJefferey Medoff   Family History  Problem Relation Age of Onset  . Heart disease Mother   . Heart attack Father   . Cancer Sister     colon  . Cancer Brother     colon    . Cancer Brother     colon  . Cancer Brother     prostate  . Cancer Brother     prostate  . Cancer Brother     Pancreas   History   Social History  . Marital Status: Married    Spouse Name: N/A    Number of Children: N/A  . Years of Education: N/A   Occupational History  . Not on file.   Social History Main Topics  . Smoking status: Former Smoker -- 11 years    Quit date: 07/01/1973  . Smokeless tobacco: Never Used  . Alcohol Use: No  . Drug Use: No  . Sexual Activity: Not on file   Other Topics Concern  . Not on file   Social History Narrative  . No narrative on file   Physical exam BP 132/84  Pulse 82  Temp(Src) 97.4 F (36.3 C) (Oral)  Resp 18  Ht 5' 8.5" (1.74 m)  Wt 179 lb 3.2 oz (81.285 kg)  BMI 26.85 kg/m2  SpO2 98%  Wt Readings from Last 3 Encounters:  07/17/13 179 lb 3.2 oz (81.285 kg)  07/01/13 182 lb (82.555 kg)  05/06/13 184 lb (83.462 kg)    General- elderly male in no acute distress Head- atraumatic, normocephalic Eyes- PERRLA, EOMI, no pallor, no icterus, no discharge, wears corrective lenses Ears- left ear normal tympanic membrane and normal external ear canal , right ear normal tympanic membrane and normal external ear canal Neck- no lymphadenopathy, no thyromegaly, no jugular vein distension, no carotid bruit Nose- normal nasaal mucosa, no maxillary sinus tenderness, no frontal sinus tenderness Mouth- normal mucus membrane, no oral thrush, normal oropharynx, upper and lower dentures Chest- no chest wall deformities, no chest wall tenderness Cardiovascular- normal s1,s2, no murmurs/ rubs/ gallops, normal distal pulses Respiratory- bilateral clear to auscultation, no wheeze, no rhonchi, no crackles Abdomen- bowel sounds present, soft, non tender, no organomegaly, no abdominal bruits, no guarding or rigidity, no CVA tenderness Musculoskeletal- able to move all 4 extremities, no spinal and paraspinal tenderness, steady gait, no use of  assistive device, normal range of motion, no leg edema Neurological- no focal deficit, normal reflexes, normal muscle strength, normal sensation to fine touch and vibration Skin- warm and dry Psychiatry- alert and oriented to person, place and time, normal mood and affect  Labs- CBC    Component Value Date/Time   WBC 4.8 11/13/2012 1304   RBC 5.04 11/13/2012 1304   HGB 14.6 11/13/2012 1304   HCT 42.3 11/13/2012 1304   MCV 84 11/13/2012 1304   MCH 29.0 11/13/2012 1304   MCHC 34.5 11/13/2012 1304   RDW 14.3 11/13/2012 1304   LYMPHSABS 2.0 11/13/2012 1304   EOSABS 0.1 11/13/2012 1304   BASOSABS 0.0 11/13/2012 1304    CMP     Component Value Date/Time   NA 139 04/17/2013 0935   K 4.3 04/17/2013 0935   CL 101 04/17/2013 0935   CO2 24 04/17/2013 0935   GLUCOSE  127* 04/17/2013 0935   BUN 17 04/17/2013 0935   CREATININE 1.08 04/17/2013 0935   CALCIUM 9.4 04/17/2013 0935   PROT 6.8 11/13/2012 1304   AST 20 11/13/2012 1304   ALT 14 11/13/2012 1304   ALKPHOS 55 11/13/2012 1304   BILITOT 0.7 11/13/2012 1304   GFRNONAA 70 04/17/2013 0935   GFRAA 81 04/17/2013 0935   Lipid Panel     Component Value Date/Time   TRIG 76 11/13/2012 1304   HDL 38* 11/13/2012 1304   CHOLHDL 3.2 11/13/2012 1304   LDLCALC 69 11/13/2012 1304   Lab Results  Component Value Date   HGBA1C 7.5* 04/17/2013   07/17/13 ekg- normal sinus rhythm  07/17/13 MMSE 27/30, passed clock draw  Assessment/plan  1. DM type 2 with diabetic peripheral neuropathy Reviewed recent a1c. Unable to afford onglyza. Reviewed home cbg which appears normal. Will discontinue onglyza for now and continue metformin 1000 mg bid. Monitor cbg. Will check urine microalbumin. Continue losartan and aspirin. Normal foot exam. Ophthalmology referral provided - CBC with Differential - CMP - Hemoglobin A1c - Microalbumin/Creatinine Ratio, Urine  2. Hyperlipidemia LDL goal <100 Check lipids today. Continue zocor 10 mg daily - Lipid Panel  3.  Vitamin D deficiency Continue vitamin d once a week for now  4. 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 - Vitamin B12  5. Benign hypertensive heart disease without heart failure Continue losartan and aspirin  6. Mild cognitive impairment Reviewed MMSE. Likely in setting of his vascular problems- dm, HTN, HL. Will rule out reversible causes first- check thyroid panel, b12 and RPR. Reviewed with pt and wife that alzhimer's component might also be present and if his memory worsens, will consider CT head to assess for brain atrophy and vascular changes. Also to notify if they notice behavioral changes - TSH - RPR  7. Smoking history - PSA - US Aorta; Future  8. Nocturia Has family history of prostate cancer. Assess PSA  - PSA  9. Routine general medical examination at a health care facility the patient was counseled regarding the appropriate use of alcohol, prevention of dental and periodontal disease, diet, regular sustained exercise for at least 30 minutes 5 times per week, the proper use of sunscreen and protective clothing, tobacco use, and recommended schedule for GI hemoccult testing, colonoscopy, cholesterol, thyroid and diabetes screening. Continue exercising. Script for zostavax provided. Discussed about advance directive. Patient does not have a living will. information provided  10. Screening for AAA (abdominal aortic aneurysm) With history of smoking, will get screening ultrasound to rule out AAA - US Aorta; Future  11. Encounter for routine screening for malformation using ultrasonics - US Aorta; Future

## 2013-07-18 ENCOUNTER — Other Ambulatory Visit: Payer: Self-pay | Admitting: Internal Medicine

## 2013-07-18 LAB — VITAMIN B12: VITAMIN B 12: 281 pg/mL (ref 211–946)

## 2013-07-18 LAB — CBC WITH DIFFERENTIAL/PLATELET
BASOS: 0 %
Basophils Absolute: 0 10*3/uL (ref 0.0–0.2)
EOS: 1 %
Eosinophils Absolute: 0.1 10*3/uL (ref 0.0–0.4)
HCT: 46.1 % (ref 37.5–51.0)
Hemoglobin: 15.9 g/dL (ref 12.6–17.7)
Immature Grans (Abs): 0 10*3/uL (ref 0.0–0.1)
Immature Granulocytes: 0 %
LYMPHS: 35 %
Lymphocytes Absolute: 1.5 10*3/uL (ref 0.7–3.1)
MCH: 29.4 pg (ref 26.6–33.0)
MCHC: 34.5 g/dL (ref 31.5–35.7)
MCV: 85 fL (ref 79–97)
MONOS ABS: 0.3 10*3/uL (ref 0.1–0.9)
Monocytes: 6 %
Neutrophils Absolute: 2.4 10*3/uL (ref 1.4–7.0)
Neutrophils Relative %: 58 %
RBC: 5.4 x10E6/uL (ref 4.14–5.80)
RDW: 13.9 % (ref 12.3–15.4)
WBC: 4.2 10*3/uL (ref 3.4–10.8)

## 2013-07-18 LAB — COMPREHENSIVE METABOLIC PANEL
A/G RATIO: 1.6 (ref 1.1–2.5)
ALBUMIN: 4.4 g/dL (ref 3.6–4.8)
ALT: 10 IU/L (ref 0–44)
AST: 20 IU/L (ref 0–40)
Alkaline Phosphatase: 55 IU/L (ref 39–117)
BUN/Creatinine Ratio: 16 (ref 10–22)
BUN: 20 mg/dL (ref 8–27)
CO2: 24 mmol/L (ref 18–29)
CREATININE: 1.23 mg/dL (ref 0.76–1.27)
Calcium: 9.5 mg/dL (ref 8.6–10.2)
Chloride: 99 mmol/L (ref 97–108)
GFR calc Af Amer: 69 mL/min/{1.73_m2} (ref >59)
GFR, EST NON AFRICAN AMERICAN: 60 mL/min/{1.73_m2} (ref >59)
Globulin, Total: 2.7 g/dL (ref 1.5–4.5)
Glucose: 103 mg/dL — ABNORMAL HIGH (ref 65–99)
Potassium: 4.1 mmol/L (ref 3.5–5.2)
Sodium: 137 mmol/L (ref 134–144)
TOTAL PROTEIN: 7.1 g/dL (ref 6.0–8.5)
Total Bilirubin: 0.8 mg/dL (ref 0.0–1.2)

## 2013-07-18 LAB — LIPID PANEL
Chol/HDL Ratio: 2.9 ratio units (ref 0.0–5.0)
Cholesterol, Total: 101 mg/dL (ref 100–199)
HDL: 35 mg/dL — AB (ref >39)
LDL Calculated: 53 mg/dL (ref 0–99)
Triglycerides: 63 mg/dL (ref 0–149)
VLDL CHOLESTEROL CAL: 13 mg/dL (ref 5–40)

## 2013-07-18 LAB — MICROALBUMIN / CREATININE URINE RATIO
Creatinine, Ur: 176.5 mg/dL (ref 22.0–328.0)
MICROALB/CREAT RATIO: <1.7 mg/g creat (ref 0.0–30.0)
Microalbumin, Urine: <3 ug/mL (ref 0.0–17.0)

## 2013-07-18 LAB — PSA: PSA: 1.9 ng/mL (ref 0.0–4.0)

## 2013-07-18 LAB — HEMOGLOBIN A1C
Est. average glucose Bld gHb Est-mCnc: 163 mg/dL
Hgb A1c MFr Bld: 7.3 % — ABNORMAL HIGH (ref 4.8–5.6)

## 2013-07-18 LAB — RPR: SYPHILIS RPR SCR: NONREACTIVE

## 2013-07-18 LAB — TSH: TSH: 1.77 u[IU]/mL (ref 0.450–4.500)

## 2013-07-19 ENCOUNTER — Encounter: Payer: Self-pay | Admitting: Internal Medicine

## 2013-07-19 ENCOUNTER — Encounter: Payer: Self-pay | Admitting: *Deleted

## 2013-07-30 ENCOUNTER — Encounter: Payer: Self-pay | Admitting: Internal Medicine

## 2013-07-30 DIAGNOSIS — R413 Other amnesia: Secondary | ICD-10-CM | POA: Insufficient documentation

## 2013-08-01 ENCOUNTER — Ambulatory Visit
Admission: RE | Admit: 2013-08-01 | Discharge: 2013-08-01 | Disposition: A | Discharge status: Home / Self Care | Payer: Medicare Other | Source: Ambulatory Visit | Attending: Internal Medicine | Admitting: Internal Medicine

## 2013-08-01 DIAGNOSIS — Z1389 Encounter for screening for other disorder: Secondary | ICD-10-CM

## 2013-08-01 DIAGNOSIS — Z87891 Personal history of nicotine dependence: Secondary | ICD-10-CM | POA: Diagnosis not present

## 2013-08-01 DIAGNOSIS — R935 Abnormal findings on diagnostic imaging of other abdominal regions, including retroperitoneum: Secondary | ICD-10-CM | POA: Diagnosis not present

## 2013-08-01 DIAGNOSIS — Z136 Encounter for screening for cardiovascular disorders: Secondary | ICD-10-CM

## 2013-08-13 ENCOUNTER — Other Ambulatory Visit: Payer: Self-pay | Admitting: *Deleted

## 2013-08-13 MED ORDER — AMBULATORY NON FORMULARY MEDICATION: Status: DC

## 2013-08-13 NOTE — Telephone Encounter (Signed)
Patient requested strips to be faxed to pharmacy

## 2013-09-03 ENCOUNTER — Other Ambulatory Visit: Payer: Self-pay | Admitting: Internal Medicine

## 2013-10-05 ENCOUNTER — Other Ambulatory Visit: Payer: Self-pay | Admitting: Nurse Practitioner

## 2013-10-15 ENCOUNTER — Other Ambulatory Visit: Payer: Self-pay | Admitting: *Deleted

## 2013-10-15 MED ORDER — SIMVASTATIN 10 MG PO TABS: ORAL_TABLET | ORAL | Status: DC

## 2013-10-15 NOTE — Telephone Encounter (Signed)
[facility] [address] 

## 2013-10-21 ENCOUNTER — Other Ambulatory Visit: Payer: Self-pay | Admitting: Internal Medicine

## 2013-10-23 ENCOUNTER — Telehealth: Payer: Self-pay | Admitting: *Deleted

## 2013-10-23 NOTE — Telephone Encounter (Signed)
LM to call back and schedule an appointment for a workup.

## 2013-10-23 NOTE — Telephone Encounter (Signed)
Patient wife, Santina EvansCatherine, called and left message with Forest BeckerBetty Jean regarding patient is being controlling and messing up his bank account and wife thinks it is Dementia issues and trying to hide it. He is lying a lot to her and she is worried about him. Please Advise.

## 2013-10-23 NOTE — Telephone Encounter (Signed)
Will need to be seen in the office to get workup for dementia

## 2013-10-24 NOTE — Telephone Encounter (Signed)
Patient wife Santina EvansCatherine called and left message to return her call regarding patient. Tried calling her back and LMOM to return call. # [phone]

## 2013-10-31 ENCOUNTER — Other Ambulatory Visit: Payer: Medicare Other

## 2013-11-04 ENCOUNTER — Ambulatory Visit: Payer: Medicare Other | Admitting: Pharmacotherapy

## 2013-11-15 ENCOUNTER — Other Ambulatory Visit: Payer: Medicare Other

## 2013-11-15 DIAGNOSIS — E1165 Type 2 diabetes mellitus with hyperglycemia: Secondary | ICD-10-CM | POA: Diagnosis not present

## 2013-11-15 DIAGNOSIS — E785 Hyperlipidemia, unspecified: Secondary | ICD-10-CM | POA: Diagnosis not present

## 2013-11-15 DIAGNOSIS — IMO0002 Reserved for concepts with insufficient information to code with codable children: Secondary | ICD-10-CM

## 2013-11-16 LAB — LIPID PANEL
Chol/HDL Ratio: 3.3 ratio units (ref 0.0–5.0)
Cholesterol, Total: 123 mg/dL (ref 100–199)
HDL: 37 mg/dL — ABNORMAL LOW (ref >39)
LDL Calculated: 71 mg/dL (ref 0–99)
Triglycerides: 75 mg/dL (ref 0–149)
VLDL Cholesterol Cal: 15 mg/dL (ref 5–40)

## 2013-11-16 LAB — COMPREHENSIVE METABOLIC PANEL
ALT: 14 IU/L (ref 0–44)
AST: 21 IU/L (ref 0–40)
Albumin/Globulin Ratio: 1.5 (ref 1.1–2.5)
Albumin: 4.3 g/dL (ref 3.6–4.8)
Alkaline Phosphatase: 54 IU/L (ref 39–117)
BUN/Creatinine Ratio: 14 (ref 10–22)
BUN: 17 mg/dL (ref 8–27)
CO2: 25 mmol/L (ref 18–29)
Calcium: 9.6 mg/dL (ref 8.6–10.2)
Chloride: 100 mmol/L (ref 97–108)
Creatinine, Ser: 1.18 mg/dL (ref 0.76–1.27)
GFR calc Af Amer: 73 mL/min/{1.73_m2} (ref >59)
GFR calc non Af Amer: 63 mL/min/{1.73_m2} (ref >59)
Globulin, Total: 2.9 g/dL (ref 1.5–4.5)
Glucose: 114 mg/dL — ABNORMAL HIGH (ref 65–99)
Potassium: 4.2 mmol/L (ref 3.5–5.2)
Sodium: 139 mmol/L (ref 134–144)
Total Bilirubin: 0.8 mg/dL (ref 0.0–1.2)
Total Protein: 7.2 g/dL (ref 6.0–8.5)

## 2013-11-16 LAB — HEMOGLOBIN A1C
Est. average glucose Bld gHb Est-mCnc: 157 mg/dL
Hgb A1c MFr Bld: 7.1 % — ABNORMAL HIGH (ref 4.8–5.6)

## 2013-11-16 LAB — MICROALBUMIN / CREATININE URINE RATIO
Creatinine, Ur: 215.2 mg/dL (ref 22.0–328.0)
MICROALB/CREAT RATIO: 2.1 mg/g creat (ref 0.0–30.0)
Microalbumin, Urine: 4.5 ug/mL (ref 0.0–17.0)

## 2013-11-19 ENCOUNTER — Other Ambulatory Visit: Payer: Self-pay | Admitting: Internal Medicine

## 2013-11-19 ENCOUNTER — Ambulatory Visit: Payer: Medicare Other | Admitting: Internal Medicine

## 2013-11-20 ENCOUNTER — Encounter: Payer: Self-pay | Admitting: Internal Medicine

## 2013-11-20 ENCOUNTER — Ambulatory Visit (INDEPENDENT_AMBULATORY_CARE_PROVIDER_SITE_OTHER): Payer: Medicare Other | Admitting: Internal Medicine

## 2013-11-20 VITALS — BP 115/80 | HR 94 | Temp 97.8°F | Ht 68.0 in | Wt 181.0 lb

## 2013-11-20 DIAGNOSIS — E785 Hyperlipidemia, unspecified: Secondary | ICD-10-CM

## 2013-11-20 DIAGNOSIS — G629 Polyneuropathy, unspecified: Secondary | ICD-10-CM | POA: Diagnosis not present

## 2013-11-20 DIAGNOSIS — Z23 Encounter for immunization: Secondary | ICD-10-CM

## 2013-11-20 DIAGNOSIS — E559 Vitamin D deficiency, unspecified: Secondary | ICD-10-CM | POA: Diagnosis not present

## 2013-11-20 DIAGNOSIS — E1142 Type 2 diabetes mellitus with diabetic polyneuropathy: Secondary | ICD-10-CM

## 2013-11-20 DIAGNOSIS — I119 Hypertensive heart disease without heart failure: Secondary | ICD-10-CM

## 2013-11-20 DIAGNOSIS — R413 Other amnesia: Secondary | ICD-10-CM

## 2013-11-20 MED ORDER — CALCIUM CARBONATE-VITAMIN D 500-200 MG-UNIT PO TABS: 1.0000 | ORAL_TABLET | Freq: Two times a day (BID) | ORAL | Status: DC

## 2013-11-20 NOTE — Progress Notes (Signed)
Patient ID: Danny Winters, male   DOB: 06/26/1945, 68 y.o.   MRN: [id number]    Chief Complaint  Patient presents with  . Immunizations    Discuss vaccines  . Medical Management of Chronic Issues    4 month follow up   Allergies  Allergen Reactions  . Lisinopril    HPI 68 y/o male patient is here for routine visit. His wife is present with him. He denies any complaint this visit. Home cbg reading 80-130 in am. Taking his metformin 100 bid. Not taking onglyza with cost issue. Walking for exercise for 20-30 minutes few days a week. Compliant with his medications. As per wife, continues to be forgetful   Wt Readings from Last 3 Encounters:  11/20/13 181 lb (82.101 kg)  07/17/13 179 lb 3.2 oz (81.285 kg)  07/01/13 182 lb (82.555 kg)   **Of note- Colonoscopy 2012- 2 polyps removed, benign, repeat in 2017  Review of Systems  Constitutional: Negative for fever, chills, weight loss, malaise/fatigue and diaphoresis.  HENT: Negative for congestion, hearing loss and sore throat.   Eyes: Negative for blurred vision, double vision and discharge. Has reading glasses Respiratory: Negative for cough, sputum production, shortness of breath and wheezing. smoking history present, quit for 40 years Cardiovascular: Negative for chest pain, palpitations, orthopnea and leg swelling.  Gastrointestinal: Negative for heartburn, nausea, vomiting, abdominal pain, diarrhea, melena and constipation.  Genitourinary: Negative for dysuria, urgency, frequency and flank pain. has nocturia Musculoskeletal: Negative for back pain, falls, myalgias. Has joint pain and meloxicam is helping him  Skin: Negative for itching and rash.  Neurological: Negative for dizziness, tingling, focal weakness and headaches. has history of peripheral neuropathy  Psychiatric/Behavioral: Negative for depression. Has memory loss. The patient is not nervous/anxious.    Past Medical History  Diagnosis Date  . Unspecified vitamin D  deficiency   . Unspecified hereditary and idiopathic peripheral neuropathy   . Type II or unspecified type diabetes mellitus without mention of complication, uncontrolled   . Other and unspecified hyperlipidemia   . Unspecified essential hypertension   . Unspecified arthropathy, shoulder region   . Lumbago   . Memory loss    Past Surgical History  Procedure Laterality Date  . Colonoscopy w/ polypectomy  2012    [facility]- sessile polyp in transverse colon 4 mm and another 6 mm  . Kidney stone surgery  2013    Dr Ihor GullyMark Ottelin  . Colonoscopy  2005    Dr Lennox GrumblesJefferey Medoff   Current Outpatient Prescriptions on File Prior to Visit  Medication Sig Dispense Refill  . AMBULATORY NON FORMULARY MEDICATION One Touch Ultra test strips Sig: Use to check blood twice daily. Dx: 250.00  100 strip  5  . aspirin 81 MG tablet Take 81 mg by mouth daily.      Marland Kitchen. gabapentin (NEURONTIN) 100 MG capsule Take 1 capsule (100 mg total) by mouth daily. Take one capsule once daily for pains  90 capsule  3  . glucose blood (FREESTYLE LITE) test strip Use as instructed  100 each  12  . Lancets (ONETOUCH ULTRASOFT) lancets USE TO CHECK BLOODSUGAR ONCE DAILY  100 each  3  . losartan (COZAAR) 25 MG tablet TAKE 1 TABLET EVERY DAY  30 tablet  5  . meloxicam (MOBIC) 15 MG tablet Take 1 tablet (15 mg total) by mouth daily. T  90 tablet  3  . metFORMIN (GLUCOPHAGE) 1000 MG tablet TAKE 1 TABLET BY MOUTH TWICE A DAY FOR BLOOD  SUGAR  60 tablet  3  . simvastatin (ZOCOR) 10 MG tablet TAKE 1 TABLET BY MOUTH EVERY DAY  30 tablet  2  . zoster vaccine live, PF, (ZOSTAVAX) 40981 UNT/0.65ML injection Inject 19,400 Units into the skin once.  1 each  0  . saxagliptin HCl (ONGLYZA) 5 MG TABS tablet Take 1 tablet (5 mg total) by mouth daily.  30 tablet  4   No current facility-administered medications on file prior to visit.    Physical exam BP 115/80  Pulse 94  Temp(Src) 97.8 F (36.6 C) (Oral)  Ht 5\' 8"  (1.727 m)  Wt 181 lb  (82.101 kg)  BMI 27.53 kg/m2  SpO2 97%  General- elderly male in no acute distress Head- atraumatic, normocephalic Eyes- PERRLA, EOMI, no pallor, no icterus, no discharge, wears corrective lenses Neck- no lymphadenopathy, no thyromegaly, no jugular vein distension, no carotid bruit Mouth- normal mucus membrane Cardiovascular- normal s1,s2, no murmurs/ rubs/ gallops, normal distal pulses Respiratory- bilateral clear to auscultation, no wheeze, no rhonchi, no crackles Abdomen- bowel sounds present, soft, non tender Musculoskeletal- able to move all 4 extremities, no spinal and paraspinal tenderness, steady gait, no use of assistive device, normal range of motion, no leg edema Neurological- no focal deficit Skin- warm and dry Psychiatry- alert and oriented to person, place and time, normal mood and affect  Labs- CBC    Component Value Date/Time   WBC 4.2 07/17/2013 0936   RBC 5.40 07/17/2013 0936   HGB 15.9 07/17/2013 0936   HCT 46.1 07/17/2013 0936   MCV 85 07/17/2013 0936   MCH 29.4 07/17/2013 0936   MCHC 34.5 07/17/2013 0936   RDW 13.9 07/17/2013 0936   LYMPHSABS 1.5 07/17/2013 0936   EOSABS 0.1 07/17/2013 0936   BASOSABS 0.0 07/17/2013 0936    CMP     Component Value Date/Time   NA 139 11/15/2013 1128   K 4.2 11/15/2013 1128   CL 100 11/15/2013 1128   CO2 25 11/15/2013 1128   GLUCOSE 114* 11/15/2013 1128   BUN 17 11/15/2013 1128   CREATININE 1.18 11/15/2013 1128   CALCIUM 9.6 11/15/2013 1128   PROT 7.2 11/15/2013 1128   AST 21 11/15/2013 1128   ALT 14 11/15/2013 1128   ALKPHOS 54 11/15/2013 1128   BILITOT 0.8 11/15/2013 1128   GFRNONAA 63 11/15/2013 1128   GFRAA 73 11/15/2013 1128   Lipid Panel     Component Value Date/Time   TRIG 75 11/15/2013 1128   HDL 37* 11/15/2013 1128   CHOLHDL 3.3 11/15/2013 1128   LDLCALC 71 11/15/2013 1128   Lab Results  Component Value Date   HGBA1C 7.1* 11/15/2013   Lab Results  Component Value Date   TSH 1.770 07/17/2013   Normal  urine microalbumin  Normal abdominal aorta ultrasound  Assessment/plan  07/17/13 ekg- normal sinus rhythm  07/17/13 MMSE 27/30, passed clock draw  Assessment/plan  1. DM type 2 with diabetic peripheral neuropathy Reviewed recent a1c.  Continue metformin 1000 mg bid, monitor cbg, normal urine microalbumin, uptodate with foot exam, pending eye appointment.  Continue losartan and aspirin. Continue neurontin for neuropathic pain  2. Hyperlipidemia LDL goal <100 Continue zocor 10 mg daily  3. Vitamin D deficiency Completed vit d supplement. Start oscal bid for now  4. Benign hypertensive heart disease without heart failure Continue losartan and aspirin  5. Mild cognitive impairment Get ct head to rule out ischemic changes and brain atrophy. Reversible causes ruled out with blood work being normal.  Likely has some vascular component with his dm and HTN

## 2013-11-25 ENCOUNTER — Ambulatory Visit
Admission: RE | Admit: 2013-11-25 | Discharge: 2013-11-25 | Disposition: A | Discharge status: Home / Self Care | Payer: Medicare Other | Source: Ambulatory Visit | Attending: Internal Medicine | Admitting: Internal Medicine

## 2013-11-25 DIAGNOSIS — R410 Disorientation, unspecified: Secondary | ICD-10-CM | POA: Diagnosis not present

## 2013-11-25 DIAGNOSIS — I119 Hypertensive heart disease without heart failure: Secondary | ICD-10-CM

## 2013-11-25 DIAGNOSIS — R42 Dizziness and giddiness: Secondary | ICD-10-CM | POA: Diagnosis not present

## 2013-11-25 DIAGNOSIS — R413 Other amnesia: Secondary | ICD-10-CM

## 2013-11-25 DIAGNOSIS — E785 Hyperlipidemia, unspecified: Secondary | ICD-10-CM

## 2013-12-19 ENCOUNTER — Other Ambulatory Visit: Payer: Self-pay | Admitting: Internal Medicine

## 2013-12-21 ENCOUNTER — Other Ambulatory Visit: Payer: Self-pay | Admitting: Internal Medicine

## 2013-12-23 NOTE — Telephone Encounter (Signed)
DX E11.65

## 2014-02-18 ENCOUNTER — Other Ambulatory Visit: Payer: Self-pay | Admitting: Internal Medicine

## 2014-02-19 ENCOUNTER — Other Ambulatory Visit: Payer: Self-pay | Admitting: Internal Medicine

## 2014-03-16 ENCOUNTER — Other Ambulatory Visit: Payer: Self-pay | Admitting: Internal Medicine

## 2014-03-18 ENCOUNTER — Other Ambulatory Visit: Payer: Self-pay | Admitting: Internal Medicine

## 2014-03-20 ENCOUNTER — Other Ambulatory Visit: Payer: Commercial Managed Care - HMO

## 2014-03-20 DIAGNOSIS — E780 Pure hypercholesterolemia: Secondary | ICD-10-CM | POA: Diagnosis not present

## 2014-03-20 DIAGNOSIS — G629 Polyneuropathy, unspecified: Secondary | ICD-10-CM | POA: Diagnosis not present

## 2014-03-20 DIAGNOSIS — E1142 Type 2 diabetes mellitus with diabetic polyneuropathy: Secondary | ICD-10-CM

## 2014-03-20 DIAGNOSIS — I10 Essential (primary) hypertension: Secondary | ICD-10-CM | POA: Diagnosis not present

## 2014-03-20 DIAGNOSIS — E1140 Type 2 diabetes mellitus with diabetic neuropathy, unspecified: Secondary | ICD-10-CM | POA: Diagnosis not present

## 2014-03-21 LAB — COMPREHENSIVE METABOLIC PANEL
ALK PHOS: 48 IU/L (ref 39–117)
ALT: 12 IU/L (ref 0–44)
AST: 21 IU/L (ref 0–40)
Albumin/Globulin Ratio: 1.7 (ref 1.1–2.5)
Albumin: 4.4 g/dL (ref 3.6–4.8)
BILIRUBIN TOTAL: 0.7 mg/dL (ref 0.0–1.2)
BUN/Creatinine Ratio: 18 (ref 10–22)
BUN: 21 mg/dL (ref 8–27)
CO2: 23 mmol/L (ref 18–29)
CREATININE: 1.19 mg/dL (ref 0.76–1.27)
Calcium: 9.3 mg/dL (ref 8.6–10.2)
Chloride: 100 mmol/L (ref 97–108)
GFR calc Af Amer: 72 mL/min/{1.73_m2} (ref >59)
GFR calc non Af Amer: 62 mL/min/{1.73_m2} (ref >59)
GLOBULIN, TOTAL: 2.6 g/dL (ref 1.5–4.5)
Glucose: 103 mg/dL — ABNORMAL HIGH (ref 65–99)
Potassium: 4.1 mmol/L (ref 3.5–5.2)
SODIUM: 141 mmol/L (ref 134–144)
Total Protein: 7 g/dL (ref 6.0–8.5)

## 2014-03-21 LAB — HEMOGLOBIN A1C
ESTIMATED AVERAGE GLUCOSE: 160 mg/dL
Hgb A1c MFr Bld: 7.2 % — ABNORMAL HIGH (ref 4.8–5.6)

## 2014-03-26 ENCOUNTER — Ambulatory Visit: Payer: Medicare Other | Admitting: Internal Medicine

## 2014-03-31 ENCOUNTER — Other Ambulatory Visit: Payer: Self-pay | Admitting: Internal Medicine

## 2014-04-03 ENCOUNTER — Other Ambulatory Visit: Payer: Self-pay | Admitting: *Deleted

## 2014-04-03 MED ORDER — ONETOUCH ULTRASOFT LANCETS MISC: Status: DC

## 2014-04-03 NOTE — Telephone Encounter (Signed)
Patient requested to be faxed to pharmacy 

## 2014-04-08 ENCOUNTER — Ambulatory Visit (INDEPENDENT_AMBULATORY_CARE_PROVIDER_SITE_OTHER): Payer: Commercial Managed Care - HMO | Admitting: Internal Medicine

## 2014-04-08 ENCOUNTER — Encounter: Payer: Self-pay | Admitting: Internal Medicine

## 2014-04-08 VITALS — BP 126/84 | HR 74 | Temp 98.2°F | Ht 68.0 in | Wt 181.0 lb

## 2014-04-08 DIAGNOSIS — E559 Vitamin D deficiency, unspecified: Secondary | ICD-10-CM | POA: Diagnosis not present

## 2014-04-08 DIAGNOSIS — E1142 Type 2 diabetes mellitus with diabetic polyneuropathy: Secondary | ICD-10-CM | POA: Diagnosis not present

## 2014-04-08 DIAGNOSIS — Z7189 Other specified counseling: Secondary | ICD-10-CM | POA: Diagnosis not present

## 2014-04-08 DIAGNOSIS — I119 Hypertensive heart disease without heart failure: Secondary | ICD-10-CM | POA: Diagnosis not present

## 2014-04-08 DIAGNOSIS — IMO0002 Reserved for concepts with insufficient information to code with codable children: Secondary | ICD-10-CM

## 2014-04-08 DIAGNOSIS — G629 Polyneuropathy, unspecified: Secondary | ICD-10-CM

## 2014-04-08 DIAGNOSIS — E785 Hyperlipidemia, unspecified: Secondary | ICD-10-CM

## 2014-04-08 DIAGNOSIS — M1710 Unilateral primary osteoarthritis, unspecified knee: Secondary | ICD-10-CM

## 2014-04-08 DIAGNOSIS — M179 Osteoarthritis of knee, unspecified: Secondary | ICD-10-CM

## 2014-04-08 DIAGNOSIS — Z23 Encounter for immunization: Secondary | ICD-10-CM

## 2014-04-08 MED ORDER — LOSARTAN POTASSIUM 25 MG PO TABS: 25.0000 mg | ORAL_TABLET | Freq: Every day | ORAL | Status: DC

## 2014-04-08 NOTE — Progress Notes (Signed)
Patient ID: Danny Winters, male   DOB: 10-27-1945, 69 y.o.   MRN: [id number]    Chief Complaint  Patient presents with  . Medical Management of Chronic Issues    4 month follow-up, discuss labs (copy printed)    Allergies  Allergen Reactions  . Lisinopril    HPI 69 y/o male patient is here for routine visit. He denies any complaint this visit.  DM- Home cbg reading this am was 108. cbg ranges 86-175, mostly 96-110 in am. Taking his metformin 1000 mg bid. Walking for exercise for 20-30 minutes 2 days a week. Compliant with his medications.   OA- meloxicam has been helpful. Taking ca-vit d supplement  Neuropathic pain- taking his neurontin, helpful  HTN- bp controlled, taking losartan 25 mg daily   Review of Systems   Constitutional: Negative for fever, chills and diaphoresis. Has recovered from a cold last month  HENT: Negative for congestion, hearing loss and sore throat.    Eyes: Negative for blurred vision, double vision and discharge. Has reading glasses Respiratory: Negative for cough, sputum production, shortness of breath and wheezing. smoking history present, quit for 40 years Cardiovascular: Negative for chest pain, palpitations, orthopnea and leg swelling.   Gastrointestinal: Negative for heartburn, nausea, vomiting, abdominal pain, diarrhea, melena and constipation.   Genitourinary: Negative for dysuria, urgency, frequency and flank pain. has nocturia Musculoskeletal: Negative for back pain, falls, myalgias. Has joint pain and meloxicam is helping him   Skin: Negative for itching and rash today. Has noticed allergy to pecan. Neurological: Negative for dizziness, tingling, focal weakness and headaches. has history of peripheral neuropathy   Psychiatric/Behavioral: Negative for depression. Has memory loss. The patient is not nervous/anxious.    Past Medical History  Diagnosis Date  . Unspecified vitamin D deficiency   . Unspecified hereditary and idiopathic peripheral  neuropathy   . Type II or unspecified type diabetes mellitus without mention of complication, uncontrolled   . Other and unspecified hyperlipidemia   . Unspecified essential hypertension   . Unspecified arthropathy, shoulder region   . Lumbago   . Memory loss    Current Outpatient Prescriptions on File Prior to Visit  Medication Sig Dispense Refill  . AMBULATORY NON FORMULARY MEDICATION One Touch Ultra test strips Sig: Use to check blood twice daily. Dx: 250.00 100 strip 5  . aspirin 81 MG tablet Take 81 mg by mouth daily.    . calcium-vitamin D (OSCAL 500/200 D-3) 500-200 MG-UNIT per tablet Take 1 tablet by mouth 2 (two) times daily. 60 tablet 3  . gabapentin (NEURONTIN) 100 MG capsule Take 1 capsule (100 mg total) by mouth daily. Take one capsule once daily for pains 90 capsule 3  . glucose blood (FREESTYLE LITE) test strip Use as instructed 100 each 12  . Lancets (ONETOUCH ULTRASOFT) lancets Use to check blood sugar once daily Dx: E11.65 100 each 3  . meloxicam (MOBIC) 15 MG tablet Take 1 tablet (15 mg total) by mouth daily. T 90 tablet 3  . metFORMIN (GLUCOPHAGE) 1000 MG tablet TAKE 1 TABLET BY MOUTH TWICE A DAY FOR BLOOD SUGAR 60 tablet 3  . simvastatin (ZOCOR) 10 MG tablet TAKE 1 TABLET BY MOUTH EVERY DAY 30 tablet 2  . zoster vaccine live, PF, (ZOSTAVAX) [id number] UNT/0.65ML injection Inject 19,400 Units into the skin once. (Patient not taking: Reported on 04/08/2014) 1 each 0   No current facility-administered medications on file prior to visit.    Physical exam BP 126/84 mmHg  Pulse 74  Temp(Src) 98.2 F (36.8 C) (Oral)  Ht  (1.727 m)  Wt 181 lb (82.101 kg)  BMI 27.53 kg/m2  SpO2 97%  Wt Readings from Last 3 Encounters:  04/08/14 181 lb (82.101 kg)  11/20/13 181 lb (82.101 kg)  07/17/13 179 lb 3.2 oz (81.285 kg)   General- elderly male in no acute distress Head- atraumatic, normocephalic Eyes- PERRLA, EOMI, no pallor, no icterus, no discharge, wears corrective  lenses Neck- no lymphadenopathy Mouth- normal mucus membrane Cardiovascular- normal s1,s2, no murmurs, normal distal pulses Respiratory- bilateral clear to auscultation, no wheeze, no rhonchi, no crackles Abdomen- bowel sounds present, soft, non tender Musculoskeletal- able to move all 4 extremities, no spinal and paraspinal tenderness, steady gait, no use of assistive device, normal range of motion, no leg edema Neurological- no focal deficit Skin- warm and dry Psychiatry- alert and oriented to person, place and time, normal mood and affect   Lab Results  Component Value Date   HGBA1C 7.2* 03/20/2014   BMP Latest Ref Rng 03/20/2014 11/15/2013 07/17/2013  Glucose 65 - 99 mg/dL 161(W) 960(A) 540(J)  BUN 8 - 27 mg/dL Creatinine 0.76 - 1.27 mg/dL 8.11 9.14 7.82  BUN/Creat Ratio 10 - Sodium 134 - 144 mmol/L 141 139 137  Potassium 3.5 - 5.2 mmol/L 4.1 4.2 4.1  Chloride 97 - 108 mmol/L 100 100 99  CO2 18 - 29 mmol/L Calcium 8.6 - 10.2 mg/dL 9.3 9.6 9.5   Lipid Panel     Component Value Date/Time   CHOL 123 11/15/2013 1128   TRIG 75 11/15/2013 1128   HDL 37* 11/15/2013 1128   CHOLHDL 3.3 11/15/2013 1128   LDLCALC 71 11/15/2013 1128    Assessment/plan  1. DM type 2 with diabetic peripheral neuropathy Reviewed a1c. Continue metformin 1000 mg bid. Continue aspirin, losartan and zocor. Monitor cbg. uptodate with diabetic foot and eye exam. Dietary and exercise counselling provided. Future labs prevnar today. Continue gabapentin for neuropathy - CMP; Future - CBC with Differential; Future - Lipid Panel; Future - Hemoglobin A1c; Future - Pneumococcal conjugate vaccine 13-valent  2. Benign hypertensive heart disease without heart failure Stable continue losartan 25 mg dailt, monitor bp  3. Hyperlipidemia LDL goal <100 Continue zocor 10 mg daily with dietary change  4. Vitamin D deficiency Continue oscal  5. Advanced care planning/counseling  discussion Reviewed about living will and power of attorney. Pt working with his daughter to get this done. Declines reading material  6. Need for vaccination with 13-polyvalent pneumococcal conjugate vaccine - Pneumococcal conjugate vaccine 13-valent  7. OA Continue meloxicam and oscal supplement

## 2014-04-14 ENCOUNTER — Other Ambulatory Visit: Payer: Self-pay | Admitting: *Deleted

## 2014-04-14 MED ORDER — ACCU-CHEK ADVANTAGE DIABETES KIT: PACK | Status: DC

## 2014-04-14 MED ORDER — ACCU-CHEK SOFT TOUCH LANCETS MISC: Status: DC

## 2014-04-14 MED ORDER — GLUCOSE BLOOD VI STRP: ORAL_STRIP | Status: DC

## 2014-04-14 NOTE — Telephone Encounter (Signed)
[facility]

## 2014-05-23 ENCOUNTER — Other Ambulatory Visit: Payer: Self-pay | Admitting: Internal Medicine

## 2014-05-26 ENCOUNTER — Other Ambulatory Visit: Payer: Self-pay | Admitting: Internal Medicine

## 2014-07-18 ENCOUNTER — Other Ambulatory Visit: Payer: Self-pay | Admitting: Internal Medicine

## 2014-09-08 ENCOUNTER — Other Ambulatory Visit: Payer: Self-pay | Admitting: Internal Medicine

## 2014-10-15 ENCOUNTER — Other Ambulatory Visit: Payer: Self-pay | Admitting: Internal Medicine

## 2014-10-23 ENCOUNTER — Other Ambulatory Visit: Payer: Self-pay | Admitting: Internal Medicine

## 2014-10-24 ENCOUNTER — Other Ambulatory Visit: Payer: Self-pay

## 2014-11-17 ENCOUNTER — Other Ambulatory Visit: Payer: Self-pay | Admitting: Internal Medicine

## 2014-11-19 ENCOUNTER — Other Ambulatory Visit: Payer: Self-pay | Admitting: Internal Medicine

## 2014-11-26 ENCOUNTER — Other Ambulatory Visit: Payer: Self-pay | Admitting: *Deleted

## 2014-11-26 MED ORDER — METFORMIN HCL 1000 MG PO TABS: ORAL_TABLET | ORAL | Status: DC

## 2014-11-26 NOTE — Telephone Encounter (Signed)
[facility] [address] Noted patient needs follow up appointment before anymore future refills.

## 2014-12-06 ENCOUNTER — Other Ambulatory Visit: Payer: Self-pay | Admitting: Internal Medicine

## 2014-12-10 ENCOUNTER — Other Ambulatory Visit: Payer: Self-pay | Admitting: Nurse Practitioner

## 2014-12-10 ENCOUNTER — Other Ambulatory Visit: Payer: Self-pay | Admitting: *Deleted

## 2014-12-10 NOTE — Telephone Encounter (Signed)
Received request from pharmacy stating patient is requesting #90 day supplies on his medication. Sent request back stating patient needs an appointment.

## 2015-01-14 ENCOUNTER — Other Ambulatory Visit: Payer: Self-pay | Admitting: Nurse Practitioner

## 2015-01-14 ENCOUNTER — Other Ambulatory Visit: Payer: Self-pay | Admitting: Internal Medicine

## 2015-01-15 ENCOUNTER — Other Ambulatory Visit: Payer: Self-pay | Admitting: Internal Medicine

## 2015-01-15 ENCOUNTER — Other Ambulatory Visit: Payer: Self-pay | Admitting: Nurse Practitioner

## 2015-01-19 ENCOUNTER — Other Ambulatory Visit: Payer: Self-pay | Admitting: Internal Medicine

## 2015-01-23 ENCOUNTER — Ambulatory Visit (INDEPENDENT_AMBULATORY_CARE_PROVIDER_SITE_OTHER): Payer: Commercial Managed Care - HMO | Admitting: Internal Medicine

## 2015-01-23 ENCOUNTER — Encounter: Payer: Self-pay | Admitting: Internal Medicine

## 2015-01-23 VITALS — BP 130/90 | HR 93 | Temp 97.9°F | Resp 20 | Ht 68.0 in | Wt 187.0 lb

## 2015-01-23 DIAGNOSIS — I10 Essential (primary) hypertension: Secondary | ICD-10-CM | POA: Diagnosis not present

## 2015-01-23 DIAGNOSIS — T7840XA Allergy, unspecified, initial encounter: Secondary | ICD-10-CM

## 2015-01-23 DIAGNOSIS — G3184 Mild cognitive impairment, so stated: Secondary | ICD-10-CM

## 2015-01-23 DIAGNOSIS — E785 Hyperlipidemia, unspecified: Secondary | ICD-10-CM | POA: Diagnosis not present

## 2015-01-23 DIAGNOSIS — E1142 Type 2 diabetes mellitus with diabetic polyneuropathy: Secondary | ICD-10-CM

## 2015-01-23 DIAGNOSIS — Z23 Encounter for immunization: Secondary | ICD-10-CM

## 2015-01-23 MED ORDER — OLMESARTAN MEDOXOMIL 20 MG PO TABS: 20.0000 mg | ORAL_TABLET | Freq: Every day | ORAL | Status: DC

## 2015-01-23 NOTE — Patient Instructions (Signed)
STOP losartan due to allergic reaction  START generic benicar for blood pressure and kidney protection  Continue other medications as ordered  Please call office with name of meter so that we can send diabetic supplies to pharmacy  Follow up in 1 month for HTN. Will call with lab results

## 2015-01-23 NOTE — Progress Notes (Signed)
Patient ID: Danny Winters, male   DOB: [birth year], 69 y.o.   MRN: [id number]    Location:   [facility]    Place of Service:   OFFICE  Chief Complaint  Patient presents with  . Medical Management of Chronic Issues     DM, Hypertension , Hypelipidemia    HPI:  69 yo male seen today for f/u. He c/o occasional itching generalized that unrelated to food. He has itchy swollen eyes and progresses to feet. No CP, SOB or anaphylaxis. He has angioedema of lips at times. Currently takes losartan and has taken it several years without a problem. no dry cough. His sx's began about 1 yr ago when he noticed pill color changed for losartan  He has occasional episodes where it feels like food gets stuck when swallowing. The sx's resolve after he takes OTC PPI.  DM - CBG 103 this AM. No low BS reactions. No numbness or tingling.   Past Medical History  Diagnosis Date  . Unspecified vitamin D deficiency   . Unspecified hereditary and idiopathic peripheral neuropathy   . Type II or unspecified type diabetes mellitus without mention of complication, uncontrolled   . Other and unspecified hyperlipidemia   . Unspecified essential hypertension   . Unspecified arthropathy, shoulder region   . Lumbago   . Memory loss     Past Surgical History  Procedure Laterality Date  . Colonoscopy w/ polypectomy  2012    [facility]- sessile polyp in transverse colon 4 mm and another 6 mm  . Kidney stone surgery  2013    Dr Kathie Rhodes  . Colonoscopy  2005    Dr Jess Barters    Patient Care Team: Gildardo Cranker, DO as PCP - General (Internal Medicine)  Social History   Social History  . Marital Status: Married    Spouse Name: N/A  . Number of Children: N/A  . Years of Education: N/A   Occupational History  . Not on file.   Social History Main Topics  . Smoking status: Former Smoker -- 11 years    Quit date: 07/01/1973  . Smokeless tobacco: Never Used  . Alcohol Use: No  . Drug Use: No  . Sexual  Activity: Not on file   Other Topics Concern  . Not on file   Social History Narrative     reports that he quit smoking about 41 years ago. He has never used smokeless tobacco. He reports that he does not drink alcohol or use illicit drugs.  Allergies  Allergen Reactions  . Lisinopril     Medications: Patient's Medications  New Prescriptions   No medications on file  Previous Medications   ASPIRIN 81 MG TABLET    Take 81 mg by mouth daily.   BLOOD GLUCOSE MONITORING SUPPL (ACCU-CHEK ADVANTAGE DIABETES) KIT    Use machine to test blood sugar twice daily. Dx: E11.65   CALCIUM-VITAMIN D (OSCAL 500/200 D-3) 500-200 MG-UNIT PER TABLET    Take 1 tablet by mouth 2 (two) times daily.   GABAPENTIN (NEURONTIN) 100 MG CAPSULE    TAKE 1 CAPSULE BY MOUTH DAILY FOR PAINS**NEEDS APPOINTMENT**   GLUCOSE BLOOD TEST STRIP    Use to test blood sugar twice daily. Dx: E11.65   LANCETS (ACCU-CHEK SOFT TOUCH) LANCETS    Use to test Blood sugar twice daily. Dx: E11.65   LOSARTAN (COZAAR) 25 MG TABLET    TAKE 1 TABLET BY MOUTH EVERY DAY   LOSARTAN (COZAAR) 25 MG TABLET  TAKE 1 TABLET BY MOUTH EVERY DAY   MELOXICAM (MOBIC) 15 MG TABLET    TAKE 1 TABLET (15 MG TOTAL) BY MOUTH DAILY. T   METFORMIN (GLUCOPHAGE) 1000 MG TABLET    APPOINTMENT OVERDUE, 1 by mouth twice daily for blood sugar   SIMVASTATIN (ZOCOR) 10 MG TABLET    TAKE ONE TABLET BY MOUTH ONCE DAILY FOR CHOLESTEROL   ZOSTER VACCINE LIVE, PF, (ZOSTAVAX) 02725 UNT/0.65ML INJECTION    Inject 19,400 Units into the skin once.  Modified Medications   No medications on file  Discontinued Medications   No medications on file    Review of Systems  Unable to perform ROS: Dementia    Filed Vitals:   01/23/15 1336  BP: 130/90  Pulse: 93  Temp: 97.9 F (36.6 C)  TempSrc: Oral  Resp: 20  Height: '5\' 8"'  (1.727 m)  Weight: 187 lb (84.823 kg)  SpO2: 97%   Body mass index is 28.44 kg/(m^2).  Physical Exam  Constitutional: He appears  well-developed and well-nourished.  HENT:  Mouth/Throat: Oropharynx is clear and moist.  Eyes: Pupils are equal, round, and reactive to light. No scleral icterus.  Neck: Neck supple. Carotid bruit is not present. No thyromegaly present.  Cardiovascular: Normal rate, regular rhythm, normal heart sounds and intact distal pulses.  Exam reveals no gallop and no friction rub.   No murmur heard. no distal LE swelling. No calf TTP  Pulmonary/Chest: Effort normal and breath sounds normal. He has no wheezes. He has no rales. He exhibits no tenderness.  Abdominal: Soft. Bowel sounds are normal. He exhibits no distension, no abdominal bruit, no pulsatile midline mass and no mass. There is no tenderness. There is no rebound and no guarding.  Lymphadenopathy:    He has no cervical adenopathy.  Neurological: He is alert.  Skin: Skin is warm and dry. No rash noted.  Psychiatric: He has a normal mood and affect. His behavior is normal.     Labs reviewed: No visits with results within 3 Month(s) from this visit. Latest known visit with results is:  Appointment on 03/20/2014  Component Date Value Ref Range Status  . Glucose 03/20/2014 103* 65 - 99 mg/dL Final  . BUN 03/20/2014 21  8 - 27 mg/dL Final  . Creatinine, Ser 03/20/2014 1.19  0.76 - 1.27 mg/dL Final  . GFR calc non Af Amer 03/20/2014 62  >59 mL/min/1.73 Final  . GFR calc Af Amer 03/20/2014 72  >59 mL/min/1.73 Final  . BUN/Creatinine Ratio 03/20/2014 18  10 - 22 Final  . Sodium 03/20/2014 141  134 - 144 mmol/L Final  . Potassium 03/20/2014 4.1  3.5 - 5.2 mmol/L Final  . Chloride 03/20/2014 100  97 - 108 mmol/L Final  . CO2 03/20/2014 23  18 - 29 mmol/L Final  . Calcium 03/20/2014 9.3  8.6 - 10.2 mg/dL Final  . Total Protein 03/20/2014 7.0  6.0 - 8.5 g/dL Final  . Albumin 03/20/2014 4.4  3.6 - 4.8 g/dL Final  . Globulin, Total 03/20/2014 2.6  1.5 - 4.5 g/dL Final  . Albumin/Globulin Ratio 03/20/2014 1.7  1.1 - 2.5 Final  . Bilirubin Total  03/20/2014 0.7  0.0 - 1.2 mg/dL Final  . Alkaline Phosphatase 03/20/2014 48  39 - 117 IU/L Final  . AST 03/20/2014 21  0 - 40 IU/L Final  . ALT 03/20/2014 12  0 - 44 IU/L Final  . Hgb A1c MFr Bld 03/20/2014 7.2* 4.8 - 5.6 % Final  Comment:          Pre-diabetes: 5.7 - 6.4          Diabetes: >6.4          Glycemic control for adults with diabetes: <7.0   . Est. average glucose Bld gHb Est-m* 03/20/2014 160   Final    No results found.   Assessment/Plan    ICD-9-CM ICD-10-CM   1. Hypersensitivity reaction, initial encounter - possibly related to losartan 995.3 T78.40XA CMP  2. DM type 2 with diabetic peripheral neuropathy (HCC) - controlled 250.60 E11.42 olmesartan (BENICAR) 20 MG tablet   357.2  CMP     Hemoglobin A1c     Microalbumin/Creatinine Ratio, Urine     Urinalysis with Reflex Microscopic  3. Need for prophylactic vaccination and inoculation against influenza V04.81 Z23 Flu Vaccine QUAD 36+ mos PF IM (Fluarix & Fluzone Quad PF)  4. Mild cognitive impairment - stable 331.83 G31.84   5. Essential hypertension - stable 401.9 I10 olmesartan (BENICAR) 20 MG tablet     Urinalysis with Reflex Microscopic  6. Hyperlipidemia LDL goal <100 - stable 272.4 E78.5 Lipid Panel   STOP losartan due to allergic reaction  START generic benicar for blood pressure and kidney protection  Continue other medications as ordered  Please call office with name of meter so that we can send diabetic supplies to pharmacy  Follow up in 1 month for HTN. Will call with lab results   [facility] S. Perlie Gold  [facility] [address] [place], [place] [address] [phone] Cell (Monday-Friday 8 AM - 5 PM) [phone] After 5 PM and follow prompts

## 2015-01-24 LAB — MICROALBUMIN / CREATININE URINE RATIO
CREATININE, UR: 177.9 mg/dL
MICROALB/CREAT RATIO: 9.8 mg/g{creat} (ref 0.0–30.0)
MICROALBUM., U, RANDOM: 17.4 ug/mL

## 2015-01-24 LAB — COMPREHENSIVE METABOLIC PANEL
ALBUMIN: 4.4 g/dL (ref 3.5–4.8)
ALT: 14 IU/L (ref 0–44)
AST: 22 IU/L (ref 0–40)
Albumin/Globulin Ratio: 1.7 (ref 1.1–2.5)
Alkaline Phosphatase: 62 IU/L (ref 39–117)
BUN / CREAT RATIO: 14 (ref 10–22)
BUN: 15 mg/dL (ref 8–27)
Bilirubin Total: 0.5 mg/dL (ref 0.0–1.2)
CO2: 26 mmol/L (ref 18–29)
CREATININE: 1.1 mg/dL (ref 0.76–1.27)
Calcium: 9.4 mg/dL (ref 8.6–10.2)
Chloride: 99 mmol/L (ref 96–106)
GFR calc non Af Amer: 68 mL/min/{1.73_m2} (ref >59)
GFR, EST AFRICAN AMERICAN: 78 mL/min/{1.73_m2} (ref >59)
GLUCOSE: 114 mg/dL — AB (ref 65–99)
Globulin, Total: 2.6 g/dL (ref 1.5–4.5)
Potassium: 4.4 mmol/L (ref 3.5–5.2)
Sodium: 142 mmol/L (ref 134–144)
TOTAL PROTEIN: 7 g/dL (ref 6.0–8.5)

## 2015-01-24 LAB — URINALYSIS, ROUTINE W REFLEX MICROSCOPIC
BILIRUBIN UA: NEGATIVE
GLUCOSE, UA: NEGATIVE
KETONES UA: NEGATIVE
LEUKOCYTES UA: NEGATIVE
Nitrite, UA: NEGATIVE
RBC UA: NEGATIVE
SPEC GRAV UA: 1.021 (ref 1.005–1.030)
Urobilinogen, Ur: 0.2 mg/dL (ref 0.2–1.0)
pH, UA: 6.5 (ref 5.0–7.5)

## 2015-01-24 LAB — LIPID PANEL
CHOLESTEROL TOTAL: 133 mg/dL (ref 100–199)
Chol/HDL Ratio: 3.6 ratio units (ref 0.0–5.0)
HDL: 37 mg/dL — ABNORMAL LOW (ref >39)
LDL CALC: 73 mg/dL (ref 0–99)
TRIGLYCERIDES: 113 mg/dL (ref 0–149)
VLDL Cholesterol Cal: 23 mg/dL (ref 5–40)

## 2015-01-24 LAB — HEMOGLOBIN A1C
Est. average glucose Bld gHb Est-mCnc: 160 mg/dL
Hgb A1c MFr Bld: 7.2 % — ABNORMAL HIGH (ref 4.8–5.6)

## 2015-02-19 ENCOUNTER — Other Ambulatory Visit: Payer: Self-pay | Admitting: Internal Medicine

## 2015-02-20 ENCOUNTER — Other Ambulatory Visit: Payer: Self-pay | Admitting: *Deleted

## 2015-02-20 MED ORDER — ACCU-CHEK SOFT TOUCH LANCETS MISC: Status: DC

## 2015-02-20 MED ORDER — BD SWAB SINGLE USE REGULAR PADS: MEDICATED_PAD | Status: DC

## 2015-02-20 MED ORDER — GLUCOSE BLOOD VI STRP: ORAL_STRIP | Status: DC

## 2015-02-20 MED ORDER — SIMVASTATIN 10 MG PO TABS: ORAL_TABLET | ORAL | Status: DC

## 2015-02-20 MED ORDER — GABAPENTIN 100 MG PO CAPS: ORAL_CAPSULE | ORAL | Status: DC

## 2015-02-20 MED ORDER — METFORMIN HCL 1000 MG PO TABS: ORAL_TABLET | ORAL | Status: DC

## 2015-02-20 MED ORDER — MELOXICAM 15 MG PO TABS: ORAL_TABLET | ORAL | Status: DC

## 2015-02-20 NOTE — Telephone Encounter (Signed)
[facility] 

## 2015-02-23 ENCOUNTER — Other Ambulatory Visit: Payer: Self-pay | Admitting: *Deleted

## 2015-02-23 ENCOUNTER — Telehealth: Payer: Self-pay | Admitting: *Deleted

## 2015-02-23 MED ORDER — ACCU-CHEK SOFT TOUCH LANCETS MISC: Status: DC

## 2015-02-23 MED ORDER — SIMVASTATIN 10 MG PO TABS: ORAL_TABLET | ORAL | Status: DC

## 2015-02-23 MED ORDER — GLUCOSE BLOOD VI STRP: ORAL_STRIP | Status: DC

## 2015-02-23 NOTE — Telephone Encounter (Signed)
Patient called and stated that the High Blood Pressure medication you placed him on, Benicar is too expensive $110 because he has not met his deductible yet. Going to discuss this with you at his appointment on Wednesday.

## 2015-02-23 NOTE — Telephone Encounter (Signed)
[facility] 

## 2015-02-25 ENCOUNTER — Ambulatory Visit: Payer: Commercial Managed Care - HMO | Admitting: Internal Medicine

## 2015-02-25 ENCOUNTER — Other Ambulatory Visit: Payer: Self-pay | Admitting: *Deleted

## 2015-02-25 MED ORDER — ACCU-CHEK SOFTCLIX LANCET DEV MISC: Status: DC

## 2015-02-25 NOTE — Telephone Encounter (Signed)
[facility] sent fax stating patient uses Accu Chek Softclix Lancets.

## 2015-03-10 ENCOUNTER — Other Ambulatory Visit: Payer: Self-pay | Admitting: *Deleted

## 2015-03-10 MED ORDER — ACCU-CHEK SOFTCLIX LANCET DEV MISC: Status: DC

## 2015-03-10 MED ORDER — MELOXICAM 15 MG PO TABS: ORAL_TABLET | ORAL | Status: DC

## 2015-03-10 MED ORDER — GLUCOSE BLOOD VI STRP: ORAL_STRIP | Status: DC

## 2015-03-10 MED ORDER — ACCU-CHEK SOFTCLIX LANCET DEV KIT
PACK | Status: DC
Start: 2015-03-10 — End: 2017-04-18

## 2015-03-10 MED ORDER — SIMVASTATIN 10 MG PO TABS: ORAL_TABLET | ORAL | Status: DC

## 2015-03-10 MED ORDER — ACCU-CHEK AVIVA VI SOLN: Status: DC

## 2015-03-10 MED ORDER — METFORMIN HCL 1000 MG PO TABS: ORAL_TABLET | ORAL | Status: DC

## 2015-03-10 MED ORDER — BD SWAB SINGLE USE REGULAR PADS: MEDICATED_PAD | Status: DC

## 2015-03-10 MED ORDER — GABAPENTIN 100 MG PO CAPS: ORAL_CAPSULE | ORAL | Status: DC

## 2015-03-10 NOTE — Telephone Encounter (Signed)
[facility] Member ID [id number]

## 2015-03-13 ENCOUNTER — Encounter: Payer: Self-pay | Admitting: Internal Medicine

## 2015-03-13 ENCOUNTER — Ambulatory Visit (INDEPENDENT_AMBULATORY_CARE_PROVIDER_SITE_OTHER): Payer: Commercial Managed Care - HMO | Admitting: Internal Medicine

## 2015-03-13 VITALS — BP 150/76 | HR 98 | Temp 97.8°F | Resp 20 | Ht 68.0 in | Wt 175.2 lb

## 2015-03-13 DIAGNOSIS — I10 Essential (primary) hypertension: Secondary | ICD-10-CM | POA: Diagnosis not present

## 2015-03-13 DIAGNOSIS — E1142 Type 2 diabetes mellitus with diabetic polyneuropathy: Secondary | ICD-10-CM | POA: Diagnosis not present

## 2015-03-13 MED ORDER — IRBESARTAN 75 MG PO TABS: 75.0000 mg | ORAL_TABLET | Freq: Every day | ORAL | Status: DC

## 2015-03-13 NOTE — Progress Notes (Signed)
Patient ID: Danny Winters, male   DOB: June 24, 1945, 70 y.o.   MRN: [id number]    Location:    [facility]   Place of Service:  OFFICE   Chief Complaint  Patient presents with  . Medical Management of Chronic Issues    1 month follow-up    HPI:  70 yo male seen today for f/u HTN. He tried Scientist, water quality and it worked but ran out and was unable to afford "deductible" $109 for RF. He had no angioedema or tongue swelling. He had to go back to losartan.   DM - BS 90-130s at home. No low BS reactions  Past Medical History  Diagnosis Date  . Unspecified vitamin D deficiency   . Unspecified hereditary and idiopathic peripheral neuropathy   . Type II or unspecified type diabetes mellitus without mention of complication, uncontrolled   . Other and unspecified hyperlipidemia   . Unspecified essential hypertension   . Unspecified arthropathy, shoulder region   . Lumbago   . Memory loss     Past Surgical History  Procedure Laterality Date  . Colonoscopy w/ polypectomy  2012    [facility]- sessile polyp in transverse colon 4 mm and another 6 mm  . Kidney stone surgery  2013    Dr Kathie Rhodes  . Colonoscopy  2005    Dr Jess Barters    Patient Care Team: Gildardo Cranker, DO as PCP - General (Internal Medicine)  Social History   Social History  . Marital Status: Married    Spouse Name: N/A  . Number of Children: N/A  . Years of Education: N/A   Occupational History  . Not on file.   Social History Main Topics  . Smoking status: Former Smoker -- 11 years    Quit date: 07/01/1973  . Smokeless tobacco: Never Used  . Alcohol Use: No  . Drug Use: No  . Sexual Activity: Not on file   Other Topics Concern  . Not on file   Social History Narrative     reports that he quit smoking about 41 years ago. He has never used smokeless tobacco. He reports that he does not drink alcohol or use illicit drugs.  Allergies  Allergen Reactions  . Lisinopril     Medications: Patient's  Medications  New Prescriptions   No medications on file  Previous Medications   ALCOHOL SWABS (B-D SINGLE USE SWABS REGULAR) PADS    Use as directed when checking blood sugar   ASPIRIN 81 MG TABLET    Take 81 mg by mouth daily.   BLOOD GLUCOSE CALIBRATION (ACCU-CHEK AVIVA) SOLN    Use as Directed Dx E11.65   BLOOD GLUCOSE MONITORING SUPPL (ACCU-CHEK ADVANTAGE DIABETES) KIT    Use machine to test blood sugar twice daily. Dx: E11.65   CALCIUM-VITAMIN D (OSCAL 500/200 D-3) 500-200 MG-UNIT PER TABLET    Take 1 tablet by mouth 2 (two) times daily.   GABAPENTIN (NEURONTIN) 100 MG CAPSULE    Take one capsule by mouth once daily for pains   GLUCOSE BLOOD (ACCU-CHEK AVIVA PLUS) TEST STRIP    Use to check blood sugar twice daily. Dx E11.65   LANCET DEVICES (ACCU-CHEK SOFTCLIX) LANCETS    Use to test blood sugar twice daily. Dx E11.65   LANCETS MISC. (ACCU-CHEK SOFTCLIX LANCET DEV) KIT    Use as Directed to check blood sugar twice daily Dx E11.65   MELOXICAM (MOBIC) 15 MG TABLET    Take one tablet by mouth once daily  METFORMIN (GLUCOPHAGE) 1000 MG TABLET    Take one tablet by mouth twice daily for blood sugar   OLMESARTAN (BENICAR) 20 MG TABLET    Take 1 tablet (20 mg total) by mouth daily. For blood pressure and kidney protection   SIMVASTATIN (ZOCOR) 10 MG TABLET    Take one tablet by mouth once daily for cholesterol   ZOSTER VACCINE LIVE, PF, (ZOSTAVAX) 27062 UNT/0.65ML INJECTION    Inject 19,400 Units into the skin once.  Modified Medications   No medications on file  Discontinued Medications   No medications on file    Review of Systems  All other systems reviewed and are negative.   Filed Vitals:   03/13/15 1406  BP: 150/76  Pulse: 98  Temp: 97.8 F (36.6 C)  TempSrc: Oral  Resp: 20  Height: _0  (1.727 m)  Weight: 175 lb 3.2 oz (79.47 kg)  SpO2: 99%   Body mass index is 26.65 kg/(m^2).  Physical Exam  Constitutional: He is oriented to person, place, and time. He appears  well-developed and well-nourished.  HENT:  No angioedema  Neurological: He is alert and oriented to person, place, and time.  Skin: Skin is warm and dry. No rash noted.  Psychiatric: He has a normal mood and affect. His behavior is normal. Judgment and thought content normal.     Labs reviewed: Office Visit on 01/23/2015  Component Date Value Ref Range Status  . Glucose 01/23/2015 114* 65 - 99 mg/dL Final  . BUN 01/23/2015 15  8 - 27 mg/dL Final  . Creatinine, Ser 01/23/2015 1.10  0.76 - 1.27 mg/dL Final  . GFR calc non Af Amer 01/23/2015 68  >59 mL/min/1.73 Final  . GFR calc Af Amer 01/23/2015 78  >59 mL/min/1.73 Final  . BUN/Creatinine Ratio 01/23/2015 14  10 - 22 Final  . Sodium 01/23/2015 142  134 - 144 mmol/L Final  . Potassium 01/23/2015 4.4  3.5 - 5.2 mmol/L Final  . Chloride 01/23/2015 99  96 - 106 mmol/L Final  . CO2 01/23/2015 26  18 - 29 mmol/L Final  . Calcium 01/23/2015 9.4  8.6 - 10.2 mg/dL Final  . Total Protein 01/23/2015 7.0  6.0 - 8.5 g/dL Final  . Albumin 01/23/2015 4.4  3.5 - 4.8 g/dL Final  . Globulin, Total 01/23/2015 2.6  1.5 - 4.5 g/dL Final  . Albumin/Globulin Ratio 01/23/2015 1.7  1.1 - 2.5 Final  . Bilirubin Total 01/23/2015 0.5  0.0 - 1.2 mg/dL Final  . Alkaline Phosphatase 01/23/2015 62  39 - 117 IU/L Final  . AST 01/23/2015 22  0 - 40 IU/L Final  . ALT 01/23/2015 14  0 - 44 IU/L Final  . Cholesterol, Total 01/23/2015 133  100 - 199 mg/dL Final  . Triglycerides 01/23/2015 113  0 - 149 mg/dL Final  . HDL 01/23/2015 37* >39 mg/dL Final  . VLDL Cholesterol Cal 01/23/2015 23  5 - 40 mg/dL Final  . LDL Calculated 01/23/2015 73  0 - 99 mg/dL Final  . Chol/HDL Ratio 01/23/2015 3.6  0.0 - 5.0 ratio units Final   Comment:                                   T. Chol/HDL Ratio  Men  Women                               1/2 Avg.Risk  3.4    3.3                                   Avg.Risk  5.0    4.4                                 2X Avg.Risk  9.6    7.1                                3X Avg.Risk 23.4   11.0   . Hgb A1c MFr Bld 01/23/2015 7.2* 4.8 - 5.6 % Final   Comment:          Pre-diabetes: 5.7 - 6.4          Diabetes: >6.4          Glycemic control for adults with diabetes: <7.0   . Est. average glucose Bld gHb Est-m* 01/23/2015 160   Final  . Creatinine, Urine 01/23/2015 177.9  Not Estab. mg/dL Final  . Microalbum.,U,Random 01/23/2015 17.4  Not Estab. ug/mL Final  . MICROALB/CREAT RATIO 01/23/2015 9.8  0.0 - 30.0 mg/g creat Final  . Specific Gravity, UA 01/23/2015 1.021  1.005 - 1.030 Final  . pH, UA 01/23/2015 6.5  5.0 - 7.5 Final  . Color, UA 01/23/2015 Yellow  Yellow Final  . Appearance Ur 01/23/2015 Cloudy* Clear Final  . Leukocytes, UA 01/23/2015 Negative  Negative Final  . Protein, UA 01/23/2015 Trace  Negative/Trace Final  . Glucose, UA 01/23/2015 Negative  Negative Final  . Ketones, UA 01/23/2015 Negative  Negative Final  . RBC, UA 01/23/2015 Negative  Negative Final  . Bilirubin, UA 01/23/2015 Negative  Negative Final  . Urobilinogen, Ur 01/23/2015 0.2  0.2 - 1.0 mg/dL Final  . Nitrite, UA 01/23/2015 Negative  Negative Final  . Microscopic Examination 01/23/2015 Comment   Final   Microscopic not indicated and not performed.    No results found.   Assessment/Plan   ICD-9-CM ICD-10-CM   1. Essential hypertension 401.9 I10 irbesartan (AVAPRO) 75 MG tablet  2. DM type 2 with diabetic peripheral neuropathy (HCC) 250.60 E11.42 irbesartan (AVAPRO) 75 MG tablet   357.2     Try generic Avapro for 1 month and if no reactions, call office to have 90 day rx sent to [facility] mail order. STOP BENICAR  Check blood pressure at local pharmacy OR at home daily if possible and call office if readings >140/90.  Continue other medications as ordered  Follow up in 3 mos for routine visit  Aundra Pung S. Perlie Gold  [facility] [address] [place], [place] [address] [phone] Cell (Monday-Friday 8 AM - 5 PM) [phone] After 5 PM and follow prompts

## 2015-03-13 NOTE — Patient Instructions (Addendum)
Try generic Avapro for 1 month and if no reactions, call office to have 90 day rx sent to [facility] mail order. STOP BENICAR  Check blood pressure at local pharmacy OR at home daily if possible and call office if readings >140/90.  Continue other medications as ordered  Follow up in 3 mos for routine visit

## 2015-06-12 ENCOUNTER — Ambulatory Visit: Payer: Commercial Managed Care - HMO | Admitting: Internal Medicine

## 2015-06-19 ENCOUNTER — Ambulatory Visit (INDEPENDENT_AMBULATORY_CARE_PROVIDER_SITE_OTHER): Payer: Commercial Managed Care - HMO | Admitting: Internal Medicine

## 2015-06-19 ENCOUNTER — Encounter: Payer: Self-pay | Admitting: Internal Medicine

## 2015-06-19 VITALS — BP 144/88 | HR 88 | Temp 98.2°F | Ht 68.0 in | Wt 178.0 lb

## 2015-06-19 DIAGNOSIS — M545 Low back pain, unspecified: Secondary | ICD-10-CM

## 2015-06-19 DIAGNOSIS — E785 Hyperlipidemia, unspecified: Secondary | ICD-10-CM | POA: Diagnosis not present

## 2015-06-19 DIAGNOSIS — I10 Essential (primary) hypertension: Secondary | ICD-10-CM | POA: Diagnosis not present

## 2015-06-19 DIAGNOSIS — G8929 Other chronic pain: Secondary | ICD-10-CM | POA: Diagnosis not present

## 2015-06-19 DIAGNOSIS — G3184 Mild cognitive impairment, so stated: Secondary | ICD-10-CM | POA: Diagnosis not present

## 2015-06-19 DIAGNOSIS — E1142 Type 2 diabetes mellitus with diabetic polyneuropathy: Secondary | ICD-10-CM | POA: Diagnosis not present

## 2015-06-19 NOTE — Progress Notes (Signed)
Patient ID: Danny Winters, male   DOB: 1945-01-26, 70 y.o.   MRN: [id number]    Location:  [facility] Place of Service: OFFICE  Chief Complaint  Patient presents with  . Medical Management of Chronic Issues    3 month follow up    HPI:  70 yo male seen today for f/u. He would like to see eye specialist for routine diabetic eye care. Last eye exam at least 2 yrs ago. He reports increased stressors at home. He is a poor historian due to memory loss. Hx obtained from chart  HTN -  He tried benicar and it worked but ran out and was unable to afford "deductible" $109 for RF. He is  Now taking avapro without reaction. No financial constraints. He takes ASA daily.   DM - BS 106 today. No low BS reactions. He is taking metformin. He is traking gabapentin for neuropathy.  A1c 7.2%. Urine micro/Cr ratio 9.8.   Hyperlipidemia - is taking simvastatin. No myalgias. LDL 73  Mild cognitive impairment - currently not taking any meds  Vit D deficiency - stable on calcium w D  Joint pain in lower back - stable on meloxicam prn.  Past Medical History  Diagnosis Date  . Unspecified vitamin D deficiency   . Unspecified hereditary and idiopathic peripheral neuropathy   . Type II or unspecified type diabetes mellitus without mention of complication, uncontrolled   . Other and unspecified hyperlipidemia   . Unspecified essential hypertension   . Unspecified arthropathy, shoulder region   . Lumbago   . Memory loss     Past Surgical History  Procedure Laterality Date  . Colonoscopy w/ polypectomy  2012    [facility]- sessile polyp in transverse colon 4 mm and another 6 mm  . Kidney stone surgery  2013    Dr Kathie Rhodes  . Colonoscopy  2005    Dr Jess Barters    Patient Care Team: Gildardo Cranker, DO as PCP - General (Internal Medicine)  Social History   Social History  . Marital Status: Married    Spouse Name: N/A  . Number of Children: N/A  . Years of Education: N/A   Occupational  History  . Not on file.   Social History Main Topics  . Smoking status: Former Smoker -- 11 years    Quit date: 07/01/1973  . Smokeless tobacco: Never Used  . Alcohol Use: No  . Drug Use: No  . Sexual Activity: Not on file   Other Topics Concern  . Not on file   Social History Narrative     reports that he quit smoking about 41 years ago. He has never used smokeless tobacco. He reports that he does not drink alcohol or use illicit drugs.  Allergies  Allergen Reactions  . Lisinopril     Medications: Patient's Medications  New Prescriptions   No medications on file  Previous Medications   ALCOHOL SWABS (B-D SINGLE USE SWABS REGULAR) PADS    Use as directed when checking blood sugar   ASPIRIN 81 MG TABLET    Take 81 mg by mouth daily.   BLOOD GLUCOSE CALIBRATION (ACCU-CHEK AVIVA) SOLN    Use as Directed Dx E11.65   BLOOD GLUCOSE MONITORING SUPPL (ACCU-CHEK ADVANTAGE DIABETES) KIT    Use machine to test blood sugar twice daily. Dx: E11.65   CALCIUM-VITAMIN D (OSCAL 500/200 D-3) 500-200 MG-UNIT PER TABLET    Take 1 tablet by mouth 2 (two) times daily.   GABAPENTIN (  NEURONTIN) 100 MG CAPSULE    Take one capsule by mouth once daily for pains   GLUCOSE BLOOD (ACCU-CHEK AVIVA PLUS) TEST STRIP    Use to check blood sugar twice daily. Dx E11.65   IRBESARTAN (AVAPRO) 75 MG TABLET    Take 1 tablet (75 mg total) by mouth daily.   LANCET DEVICES (ACCU-CHEK SOFTCLIX) LANCETS    Use to test blood sugar twice daily. Dx E11.65   LANCETS MISC. (ACCU-CHEK SOFTCLIX LANCET DEV) KIT    Use as Directed to check blood sugar twice daily Dx E11.65   MELOXICAM (MOBIC) 15 MG TABLET    Take one tablet by mouth once daily   METFORMIN (GLUCOPHAGE) 1000 MG TABLET    Take one tablet by mouth twice daily for blood sugar   SIMVASTATIN (ZOCOR) 10 MG TABLET    Take one tablet by mouth once daily for cholesterol  Modified Medications   No medications on file  Discontinued Medications   ZOSTER VACCINE LIVE, PF,  (ZOSTAVAX) 61607 UNT/0.65ML INJECTION    Inject 19,400 Units into the skin once.    Review of Systems  Unable to perform ROS: Other  memory loss  Filed Vitals:   06/19/15 1514  BP: 144/88  Pulse: 88  Temp: 98.2 F (36.8 C)  TempSrc: Oral  Height: _0  (1.727 m)  Weight: 178 lb (80.74 kg)  SpO2: 97%   Body mass index is 27.07 kg/(m^2).  Physical Exam  Constitutional: He appears well-developed and well-nourished.  HENT:  Mouth/Throat: Oropharynx is clear and moist.  Eyes: Pupils are equal, round, and reactive to light. No scleral icterus.  Neck: Neck supple. Carotid bruit is not present. No thyromegaly present.  Cardiovascular: Normal rate, regular rhythm, normal heart sounds and intact distal pulses.  Exam reveals no gallop and no friction rub.   No murmur heard. no distal LE swelling. No calf TTP  Pulmonary/Chest: Effort normal and breath sounds normal. He has no wheezes. He has no rales. He exhibits no tenderness.  Abdominal: Soft. Bowel sounds are normal. He exhibits no distension, no abdominal bruit, no pulsatile midline mass and no mass. There is no tenderness. There is no rebound and no guarding.  Musculoskeletal: He exhibits edema.  Lymphadenopathy:    He has no cervical adenopathy.  Neurological: He is alert.  Skin: Skin is warm and dry. No rash noted.  Psychiatric: He has a normal mood and affect. His behavior is normal.     Labs reviewed: No visits with results within 3 Month(s) from this visit. Latest known visit with results is:  Office Visit on 01/23/2015  Component Date Value Ref Range Status  . Glucose 01/23/2015 114* 65 - 99 mg/dL Final  . BUN 01/23/2015 15  8 - 27 mg/dL Final  . Creatinine, Ser 01/23/2015 1.10  0.76 - 1.27 mg/dL Final  . GFR calc non Af Amer 01/23/2015 68  >59 mL/min/1.73 Final  . GFR calc Af Amer 01/23/2015 78  >59 mL/min/1.73 Final  . BUN/Creatinine Ratio 01/23/2015 14  10 - 22 Final  . Sodium 01/23/2015 142  134 - 144 mmol/L  Final  . Potassium 01/23/2015 4.4  3.5 - 5.2 mmol/L Final  . Chloride 01/23/2015 99  96 - 106 mmol/L Final  . CO2 01/23/2015 26  18 - 29 mmol/L Final  . Calcium 01/23/2015 9.4  8.6 - 10.2 mg/dL Final  . Total Protein 01/23/2015 7.0  6.0 - 8.5 g/dL Final  . Albumin 01/23/2015 4.4  3.5 - 4.8  g/dL Final  . Globulin, Total 01/23/2015 2.6  1.5 - 4.5 g/dL Final  . Albumin/Globulin Ratio 01/23/2015 1.7  1.1 - 2.5 Final  . Bilirubin Total 01/23/2015 0.5  0.0 - 1.2 mg/dL Final  . Alkaline Phosphatase 01/23/2015 62  39 - 117 IU/L Final  . AST 01/23/2015 22  0 - 40 IU/L Final  . ALT 01/23/2015 14  0 - 44 IU/L Final  . Cholesterol, Total 01/23/2015 133  100 - 199 mg/dL Final  . Triglycerides 01/23/2015 113  0 - 149 mg/dL Final  . HDL 01/23/2015 37* >39 mg/dL Final  . VLDL Cholesterol Cal 01/23/2015 23  5 - 40 mg/dL Final  . LDL Calculated 01/23/2015 73  0 - 99 mg/dL Final  . Chol/HDL Ratio 01/23/2015 3.6  0.0 - 5.0 ratio units Final   Comment:                                   T. Chol/HDL Ratio                                             Men  Women                               1/2 Avg.Risk  3.4    3.3                                   Avg.Risk  5.0    4.4                                2X Avg.Risk  9.6    7.1                                3X Avg.Risk 23.4   11.0   . Hgb A1c MFr Bld 01/23/2015 7.2* 4.8 - 5.6 % Final   Comment:          Pre-diabetes: 5.7 - 6.4          Diabetes: >6.4          Glycemic control for adults with diabetes: <7.0   . Est. average glucose Bld gHb Est-m* 01/23/2015 160   Final  . Creatinine, Urine 01/23/2015 177.9  Not Estab. mg/dL Final  . Microalbum.,U,Random 01/23/2015 17.4  Not Estab. ug/mL Final  . MICROALB/CREAT RATIO 01/23/2015 9.8  0.0 - 30.0 mg/g creat Final  . Specific Gravity, UA 01/23/2015 1.021  1.005 - 1.030 Final  . pH, UA 01/23/2015 6.5  5.0 - 7.5 Final  . Color, UA 01/23/2015 Yellow  Yellow Final  . Appearance Ur 01/23/2015 Cloudy* Clear Final  .  Leukocytes, UA 01/23/2015 Negative  Negative Final  . Protein, UA 01/23/2015 Trace  Negative/Trace Final  . Glucose, UA 01/23/2015 Negative  Negative Final  . Ketones, UA 01/23/2015 Negative  Negative Final  . RBC, UA 01/23/2015 Negative  Negative Final  . Bilirubin, UA 01/23/2015 Negative  Negative Final  . Urobilinogen, Ur 01/23/2015 0.2  0.2 - 1.0 mg/dL Final  . Nitrite, UA 01/23/2015 Negative  Negative Final  . Microscopic Examination 01/23/2015 Comment   Final   Microscopic not indicated and not performed.    No results found.   Assessment/Plan   ICD-9-CM ICD-10-CM   1. DM type 2 with diabetic peripheral neuropathy ([place]) 250.60 E11.42 Ambulatory referral to Ophthalmology   295.6  Basic Metabolic Panel     ALT     Hemoglobin A1c  2. Essential hypertension- BP elevated due to increased stressors at home 401.9 O13 Basic Metabolic Panel  3. Mild cognitive impairment 331.83 G31.84 ALT  4. Hyperlipidemia LDL goal <100 272.4 E78.5 Lipid Panel  5. Chronic low back pain without sciatica, unspecified back pain laterality 724.2 M54.5    338.29 G89.29    Continue current medications as ordered  Will call with lab results  Follow up in 3 mos for routine visit  Imajean Mcdermid S. Perlie Gold  [facility] [address] [place], [place] [address] [phone] Cell (Monday-Friday 8 AM - 5 PM) [phone] After 5 PM and follow prompts

## 2015-06-19 NOTE — Patient Instructions (Addendum)
Continue current medications as ordered  Will call with lab results  Follow up in 3 mos for routine visit 

## 2015-06-20 ENCOUNTER — Other Ambulatory Visit: Payer: Self-pay | Admitting: Internal Medicine

## 2015-06-20 LAB — BASIC METABOLIC PANEL
BUN/Creatinine Ratio: 16 (ref 10–24)
BUN: 17 mg/dL (ref 8–27)
CALCIUM: 9.7 mg/dL (ref 8.6–10.2)
CHLORIDE: 102 mmol/L (ref 96–106)
CO2: 21 mmol/L (ref 18–29)
Creatinine, Ser: 1.08 mg/dL (ref 0.76–1.27)
GFR calc non Af Amer: 69 mL/min/{1.73_m2} (ref >59)
GFR, EST AFRICAN AMERICAN: 80 mL/min/{1.73_m2} (ref >59)
Glucose: 87 mg/dL (ref 65–99)
POTASSIUM: 4.3 mmol/L (ref 3.5–5.2)
Sodium: 142 mmol/L (ref 134–144)

## 2015-06-20 LAB — LIPID PANEL
Chol/HDL Ratio: 3.6 ratio units (ref 0.0–5.0)
Cholesterol, Total: 122 mg/dL (ref 100–199)
HDL: 34 mg/dL — ABNORMAL LOW (ref >39)
LDL Calculated: 58 mg/dL (ref 0–99)
Triglycerides: 151 mg/dL — ABNORMAL HIGH (ref 0–149)
VLDL CHOLESTEROL CAL: 30 mg/dL (ref 5–40)

## 2015-06-20 LAB — ALT: ALT: 14 IU/L (ref 0–44)

## 2015-06-20 LAB — HEMOGLOBIN A1C
ESTIMATED AVERAGE GLUCOSE: 169 mg/dL
Hgb A1c MFr Bld: 7.5 % — ABNORMAL HIGH (ref 4.8–5.6)

## 2015-08-05 ENCOUNTER — Encounter: Payer: Self-pay | Admitting: Internal Medicine

## 2015-08-28 IMAGING — CT CT HEAD W/O CM
2 series · 16 of 30 positions shown, 20 images · non-contrast
Comparison: None

CLINICAL DATA: Memory loss, mild confusion, and occasional
dizziness for 1 year. Initial encounter.

EXAM:
CT HEAD WITHOUT CONTRAST
TECHNIQUE: Contiguous axial images were obtained from the base of the skull
through the vertex without contrast.

[Series 2: head w/o · axial · non-contrast · 0.49mm/px · z∈[+31,+154]mm · 13 of 28 slices shown, 17 images]
[im 2/28  brain]
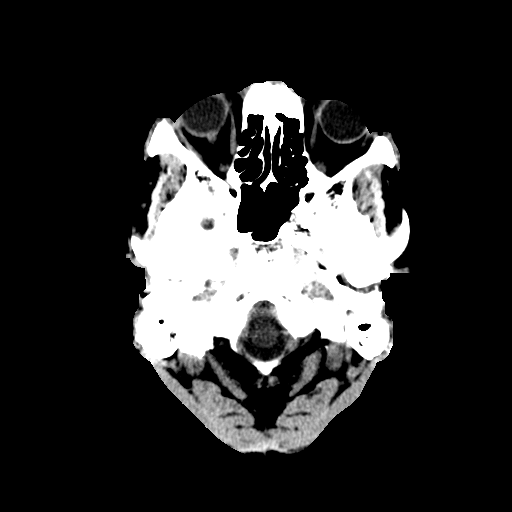
[im 2/28  bone]
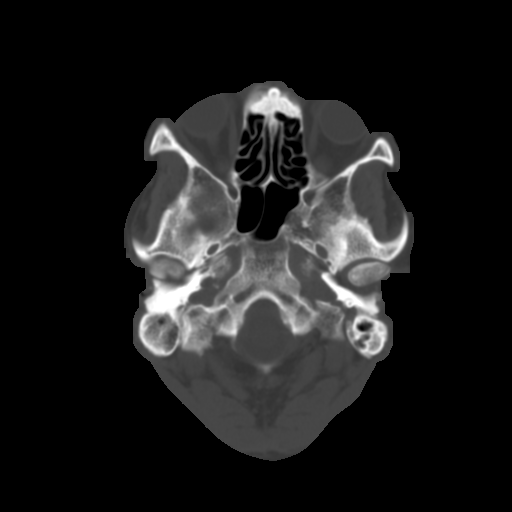
[im 4/28  brain]
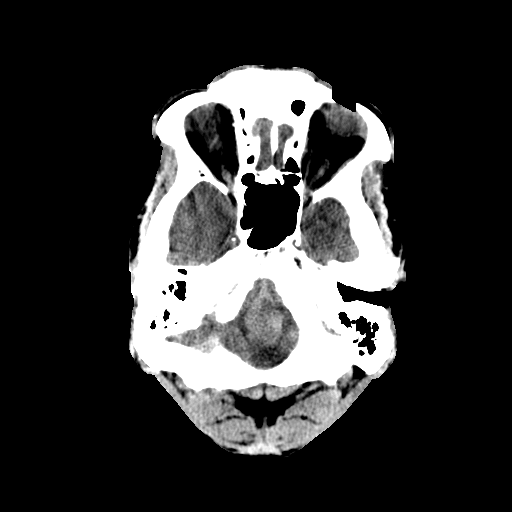
[im 6/28  brain]
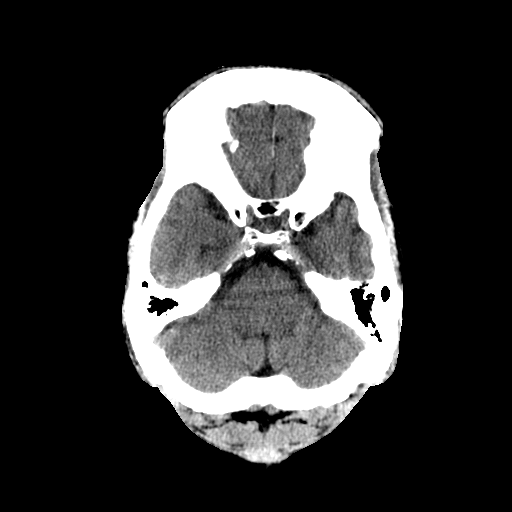
[im 8/28  brain]
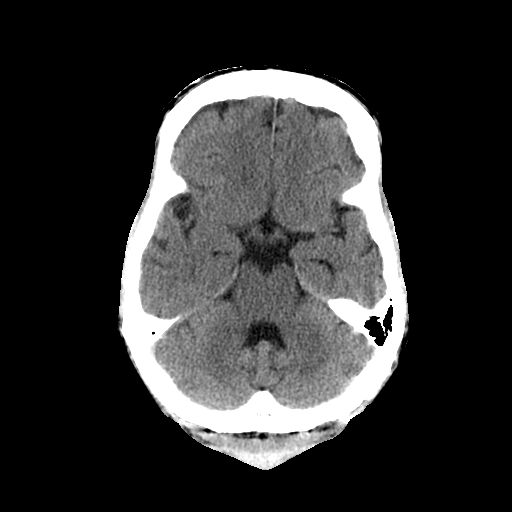
[im 10/28  brain]
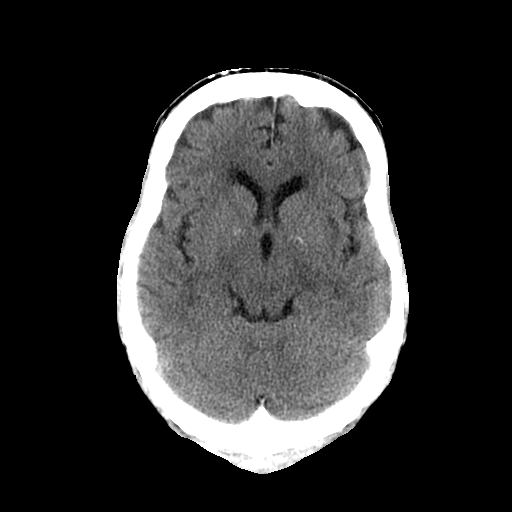
[im 10/28  bone]
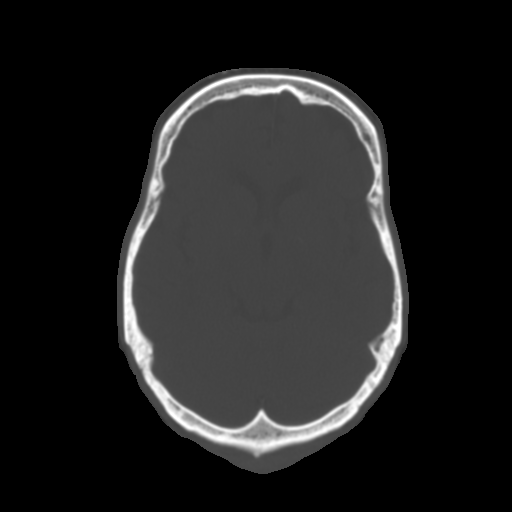
[im 12/28  brain]
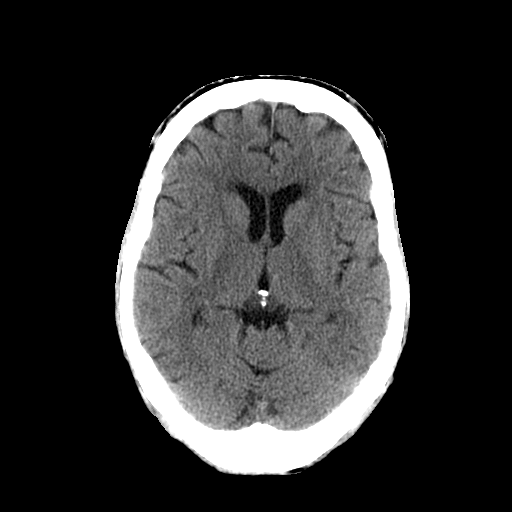
[im 14/28  brain]
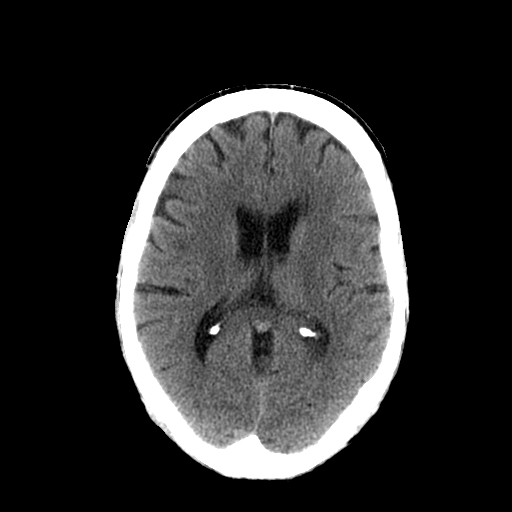
[im 16/28  brain]
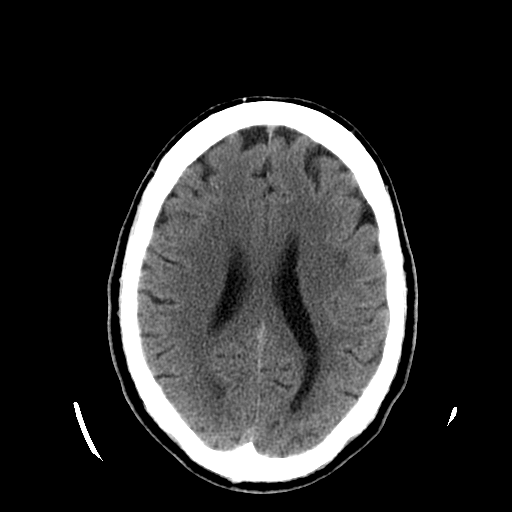
[im 18/28  brain]
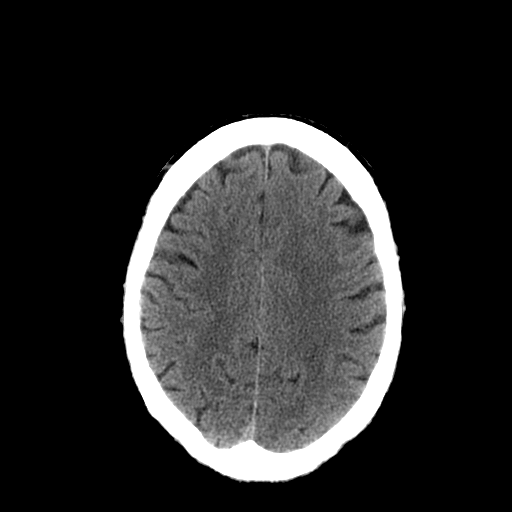
[im 18/28  bone]
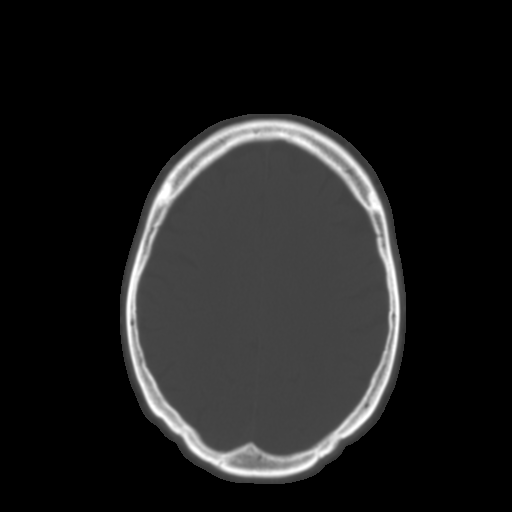
[im 20/28  brain]
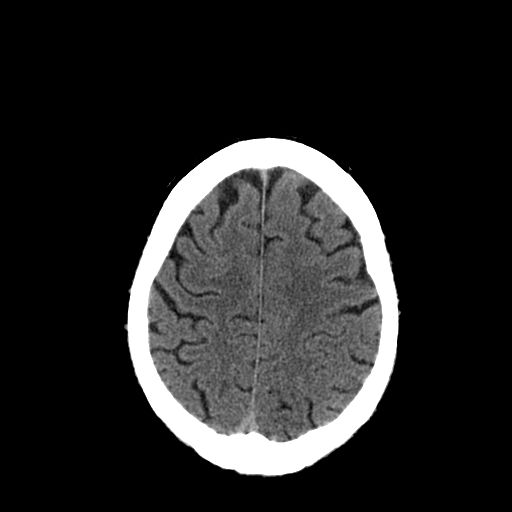
[im 22/28  brain]
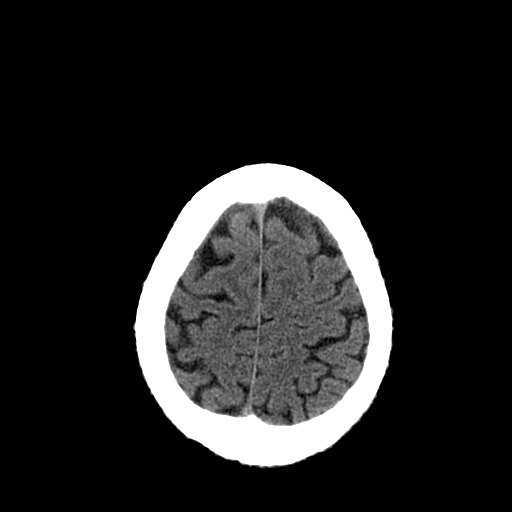
[im 24/28  brain]
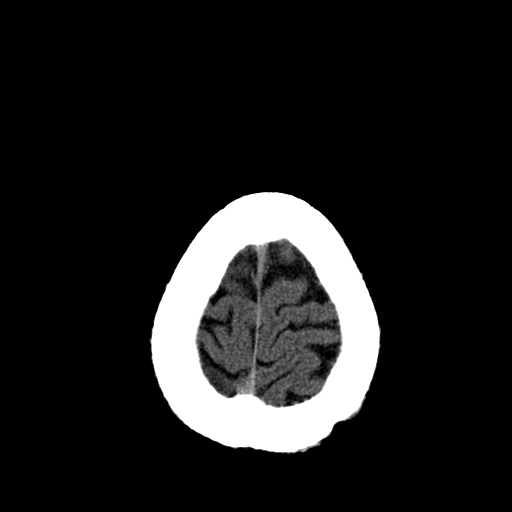
[im 26/28  brain]
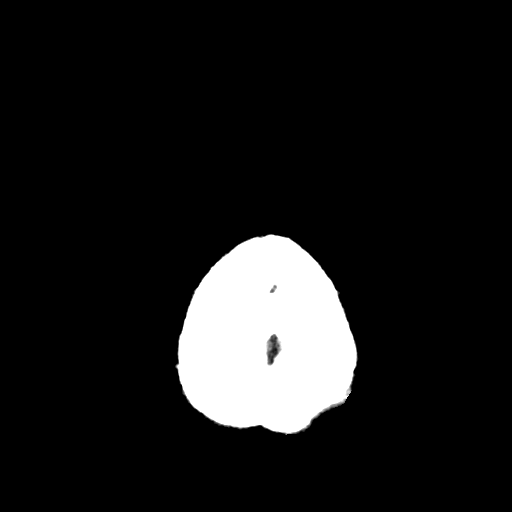
[im 26/28  bone]
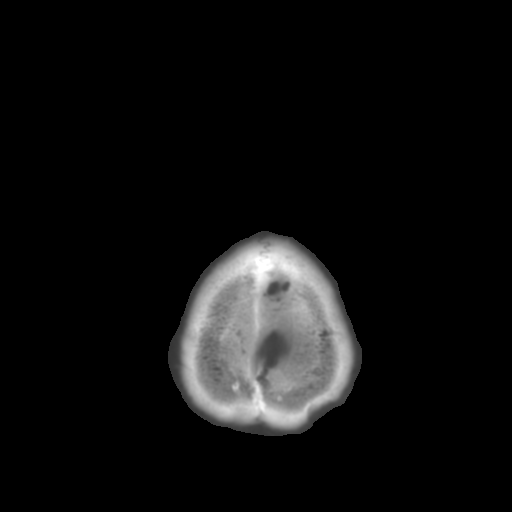

[Series 3: head bone · axial · 0.49mm/px · z∈[+31,+72]mm · 3 of 28 slices shown]
[im 2/28  bone]
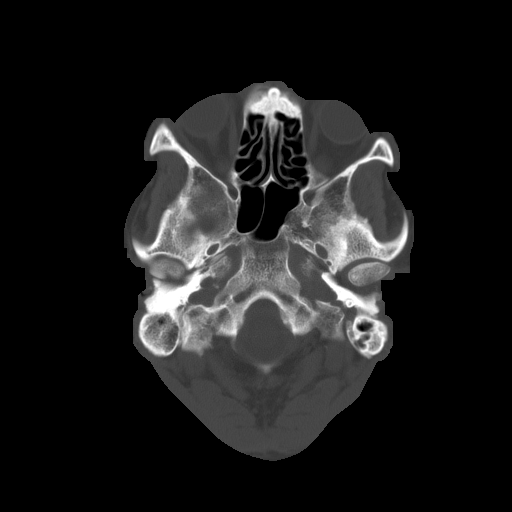
[im 6/28  bone]
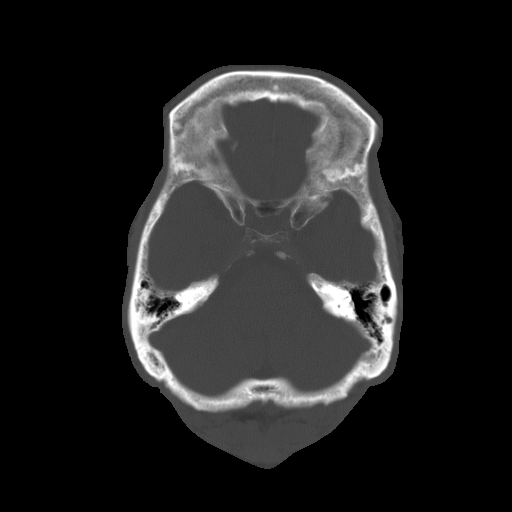
[im 10/28  bone]
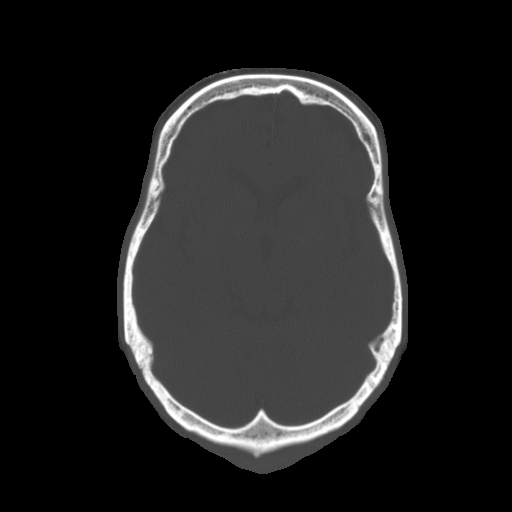

[16 of 30 positions shown; findings below may reference images not displayed]

FINDINGS: Normal for age cerebral volume. Slight hypoattenuation of white
matter, likely small vessel disease.

No evidence for acute infarction, hemorrhage, mass lesion,
hydrocephalus, or extra-axial fluid.

Calvarium intact. Vascular calcification noted carotid siphon
regions. No acute sinus or mastoid fluid.
IMPRESSION: Unremarkable for age CT head.  No acute intracranial findings.

## 2015-09-04 ENCOUNTER — Other Ambulatory Visit: Payer: Self-pay | Admitting: *Deleted

## 2015-09-04 MED ORDER — IRBESARTAN 75 MG PO TABS: ORAL_TABLET | ORAL | 1 refills | Status: DC

## 2015-09-04 NOTE — Telephone Encounter (Signed)
[facility] [address] 

## 2015-09-25 ENCOUNTER — Ambulatory Visit: Payer: Commercial Managed Care - HMO | Admitting: Internal Medicine

## 2015-10-13 ENCOUNTER — Encounter: Payer: Self-pay | Admitting: Internal Medicine

## 2015-11-27 ENCOUNTER — Encounter: Payer: Self-pay | Admitting: Internal Medicine

## 2015-11-27 ENCOUNTER — Ambulatory Visit (INDEPENDENT_AMBULATORY_CARE_PROVIDER_SITE_OTHER): Payer: Commercial Managed Care - HMO | Admitting: Internal Medicine

## 2015-11-27 VITALS — BP 142/78 | HR 81 | Temp 98.2°F | Ht 68.0 in | Wt 178.8 lb

## 2015-11-27 DIAGNOSIS — E1142 Type 2 diabetes mellitus with diabetic polyneuropathy: Secondary | ICD-10-CM | POA: Diagnosis not present

## 2015-11-27 DIAGNOSIS — G8929 Other chronic pain: Secondary | ICD-10-CM

## 2015-11-27 DIAGNOSIS — G3184 Mild cognitive impairment, so stated: Secondary | ICD-10-CM | POA: Diagnosis not present

## 2015-11-27 DIAGNOSIS — I10 Essential (primary) hypertension: Secondary | ICD-10-CM

## 2015-11-27 DIAGNOSIS — E785 Hyperlipidemia, unspecified: Secondary | ICD-10-CM

## 2015-11-27 DIAGNOSIS — M545 Low back pain: Secondary | ICD-10-CM | POA: Diagnosis not present

## 2015-11-27 DIAGNOSIS — Z23 Encounter for immunization: Secondary | ICD-10-CM

## 2015-11-27 LAB — HEMOGLOBIN A1C
HEMOGLOBIN A1C: 7.1 % — AB (ref <5.7)
Mean Plasma Glucose: 157 mg/dL

## 2015-11-27 NOTE — Patient Instructions (Addendum)
Continue current medications as ordered  Will call with lab results  Follow up in 3 mos for CPE/ECG  Please schedule AWV if covered by insurance  Flu shot given today

## 2015-11-27 NOTE — Progress Notes (Signed)
Patient ID: Danny Winters, male   DOB: July 14, 1945, 70 y.o.   MRN: [id number]    Location:  [facility] Place of Service: OFFICE  Chief Complaint  Patient presents with  . Medical Management of Chronic Issues    3 month routine visit  . Flu Vaccine    requested    HPI:  70 yo male seen today for f/u. He is traveling a lot and singing. Reports his sister, Jana Half, expired since last OV. She had Alzheimer's.  HTN -  BP stable on avapro. benicar worked but ran out and was unable to afford "deductible" $109 for RF. He is  Now taking avapro without reaction. No financial constraints. He takes ASA daily.   DM - BS 115 today. No low BS reactions. He is taking metformin. He is taking gabapentin for neuropathy.  A1c 7.5%. Urine micro/Cr ratio 9.8.   Hyperlipidemia - is taking simvastatin. No myalgias. LDL 58  Mild cognitive impairment - currently not taking any meds. Occasionally forgets where he lies down objects  Vit D deficiency - stable on calcium w D  Joint pain in lower back - stable on meloxicam prn.  Past Medical History:  Diagnosis Date  . Lumbago   . Memory loss   . Other and unspecified hyperlipidemia   . Type II or unspecified type diabetes mellitus without mention of complication, uncontrolled   . Unspecified arthropathy, shoulder region   . Unspecified essential hypertension   . Unspecified hereditary and idiopathic peripheral neuropathy   . Unspecified vitamin D deficiency     Past Surgical History:  Procedure Laterality Date  . COLONOSCOPY  2005   Dr Sharlotte Alamo Medoff  . COLONOSCOPY W/ POLYPECTOMY  2012   [facility]- sessile polyp in transverse colon 4 mm and another 6 mm  . KIDNEY STONE SURGERY  2013   Dr Kathie Rhodes    Patient Care Team: Gildardo Cranker, DO as PCP - General (Internal Medicine)  Social History   Social History  . Marital status: Married    Spouse name: N/A  . Number of children: N/A  . Years of education: N/A   Occupational History  .  Not on file.   Social History Main Topics  . Smoking status: Former Smoker    Years: 11.00    Quit date: 07/01/1973  . Smokeless tobacco: Never Used  . Alcohol use No  . Drug use: No  . Sexual activity: Not on file   Other Topics Concern  . Not on file   Social History Narrative  . No narrative on file     reports that he quit smoking about 42 years ago. He quit after 11.00 years of use. He has never used smokeless tobacco. He reports that he does not drink alcohol or use drugs.  Family History  Problem Relation Age of Onset  . Heart disease Mother   . Heart attack Father   . Cancer Sister     colon  . Cancer Brother     colon  . Cancer Brother     colon  . Cancer Brother     prostate  . Cancer Brother     prostate  . Cancer Brother     Pancreas  . Stroke Other    Family Status  Relation Status  . Mother Deceased  . Father Deceased  . Sister Deceased  . Brother Alive  . Brother Deceased  . Sister Alive  . Brother Alive  . Daughter Alive  . Son  Alive  . Sister Alive  . Sister Alive  . Sister Deceased  . Sister Deceased  . Brother Alive  . Brother Alive  . Brother Alive  . Daughter Alive  . Daughter Alive  . Brother Deceased  . Other      Allergies  Allergen Reactions  . Lisinopril     Medications: Patient's Medications  New Prescriptions   No medications on file  Previous Medications   ALCOHOL SWABS (B-D SINGLE USE SWABS REGULAR) PADS    Use as directed when checking blood sugar   ASPIRIN 81 MG TABLET    Take 81 mg by mouth daily.   BLOOD GLUCOSE CALIBRATION (ACCU-CHEK AVIVA) SOLN    Use as Directed Dx E11.65   BLOOD GLUCOSE MONITORING SUPPL (ACCU-CHEK ADVANTAGE DIABETES) KIT    Use machine to test blood sugar twice daily. Dx: E11.65   CALCIUM-VITAMIN D (OSCAL 500/200 D-3) 500-200 MG-UNIT PER TABLET    Take 1 tablet by mouth 2 (two) times daily.   GABAPENTIN (NEURONTIN) 100 MG CAPSULE    Take one capsule by mouth once daily for pains    GLUCOSE BLOOD (ACCU-CHEK AVIVA PLUS) TEST STRIP    Use to check blood sugar twice daily. Dx E11.65   IRBESARTAN (AVAPRO) 75 MG TABLET    Take one tablet by mouth once daily   LANCET DEVICES (ACCU-CHEK SOFTCLIX) LANCETS    Use to test blood sugar twice daily. Dx E11.65   LANCETS MISC. (ACCU-CHEK SOFTCLIX LANCET DEV) KIT    Use as Directed to check blood sugar twice daily Dx E11.65   MELOXICAM (MOBIC) 15 MG TABLET    Take one tablet by mouth once daily   METFORMIN (GLUCOPHAGE) 1000 MG TABLET    Take one tablet by mouth twice daily for blood sugar   SIMVASTATIN (ZOCOR) 10 MG TABLET    Take one tablet by mouth once daily for cholesterol  Modified Medications   No medications on file  Discontinued Medications   No medications on file    Review of Systems  Musculoskeletal: Positive for arthralgias and back pain.  Neurological: Positive for numbness.  Psychiatric/Behavioral: Negative for confusion.       Memory loss; feels depressed  All other systems reviewed and are negative.   Vitals:   11/27/15 1512  BP: (!) 142/78  Pulse: 81  Temp: 98.2 F (36.8 C)  TempSrc: Oral  SpO2: 97%  Weight: 178 lb 12.8 oz (81.1 kg)  Height: _0  (1.727 m)   Body mass index is 27.19 kg/m.  Physical Exam  Constitutional: He appears well-developed and well-nourished.  HENT:  Mouth/Throat: Oropharynx is clear and moist.  Eyes: Pupils are equal, round, and reactive to light. No scleral icterus.  Neck: Neck supple. Carotid bruit is not present. No thyromegaly present.  Cardiovascular: Normal rate, regular rhythm, normal heart sounds and intact distal pulses.  Exam reveals no gallop and no friction rub.   No murmur heard. no distal LE swelling. No calf TTP  Pulmonary/Chest: Effort normal and breath sounds normal. He has no wheezes. He has no rales. He exhibits no tenderness.  Abdominal: Soft. Bowel sounds are normal. He exhibits no distension, no abdominal bruit, no pulsatile midline mass and no mass.  There is no tenderness. There is no rebound and no guarding.  Musculoskeletal: He exhibits edema.  Lymphadenopathy:    He has no cervical adenopathy.  Neurological: He is alert.  Skin: Skin is warm and dry. No rash noted.  Psychiatric: He  has a normal mood and affect. His behavior is normal.     Labs reviewed: No visits with results within 3 Month(s) from this visit.  Latest known visit with results is:  Office Visit on 06/19/2015  Component Date Value Ref Range Status  . Glucose 06/20/2015 87  65 - 99 mg/dL Final  . BUN 06/20/2015 17  8 - 27 mg/dL Final  . Creatinine, Ser 06/20/2015 1.08  0.76 - 1.27 mg/dL Final  . GFR calc non Af Amer 06/20/2015 69  >59 mL/min/1.73 Final  . GFR calc Af Amer 06/20/2015 80  >59 mL/min/1.73 Final  . BUN/Creatinine Ratio 06/20/2015 16  10 - 24 Final  . Sodium 06/20/2015 142  134 - 144 mmol/L Final  . Potassium 06/20/2015 4.3  3.5 - 5.2 mmol/L Final  . Chloride 06/20/2015 102  96 - 106 mmol/L Final  . CO2 06/20/2015 21  18 - 29 mmol/L Final  . Calcium 06/20/2015 9.7  8.6 - 10.2 mg/dL Final  . ALT 06/20/2015 14  0 - 44 IU/L Final  . Hgb A1c MFr Bld 06/20/2015 7.5* 4.8 - 5.6 % Final   Comment:          Pre-diabetes: 5.7 - 6.4          Diabetes: >6.4          Glycemic control for adults with diabetes: <7.0   . Est. average glucose Bld gHb Est-m* 06/20/2015 169  mg/dL Final  . Cholesterol, Total 06/20/2015 122  100 - 199 mg/dL Final  . Triglycerides 06/20/2015 151* 0 - 149 mg/dL Final  . HDL 06/20/2015 34* >39 mg/dL Final  . VLDL Cholesterol Cal 06/20/2015 30  5 - 40 mg/dL Final  . LDL Calculated 06/20/2015 58  0 - 99 mg/dL Final  . Chol/HDL Ratio 06/20/2015 3.6  0.0 - 5.0 ratio units Final   Comment:                                   T. Chol/HDL Ratio                                             Men  Women                               1/2 Avg.Risk  3.4    3.3                                   Avg.Risk  5.0    4.4                                2X  Avg.Risk  9.6    7.1                                3X Avg.Risk 23.4   11.0     No results found.   Assessment/Plan   ICD-9-CM ICD-10-CM   1. Essential hypertension 401.9 I10   2. DM type 2 with diabetic peripheral neuropathy ([facility]) 250.60 E11.42 CMP with eGFR  357.2  Hemoglobin A1c  3. Hyperlipidemia LDL goal <100 272.4 E78.5 Lipid Panel  4. Mild cognitive impairment 331.83 G31.84   5. Chronic low back pain, unspecified back pain laterality, with sciatica presence unspecified 724.2 M54.5    338.29 G89.29   6. Encounter for immunization Z23 Z23 Flu Vaccine QUAD 36+ mos IM    Continue current medications as ordered  Will call with lab results  Follow up in 3 mos for CPE/ECG  Please schedule AWV if covered by insurance  Flu shot given today   Lyric Hoar S. Perlie Gold  [facility] [address] [place], [place] [address] [phone] Cell (Monday-Friday 8 AM - 5 PM) [phone] After 5 PM and follow prompts

## 2015-11-28 LAB — COMPLETE METABOLIC PANEL WITH GFR
ALT: 10 U/L (ref 9–46)
AST: 20 U/L (ref 10–35)
Albumin: 4.3 g/dL (ref 3.6–5.1)
Alkaline Phosphatase: 49 U/L (ref 40–115)
BUN: 17 mg/dL (ref 7–25)
CALCIUM: 9.4 mg/dL (ref 8.6–10.3)
CHLORIDE: 102 mmol/L (ref 98–110)
CO2: 24 mmol/L (ref 20–31)
Creat: 1.3 mg/dL — ABNORMAL HIGH (ref 0.70–1.18)
GFR, Est African American: 64 mL/min (ref >=60)
GFR, Est Non African American: 55 mL/min — ABNORMAL LOW (ref >=60)
GLUCOSE: 92 mg/dL (ref 65–99)
POTASSIUM: 3.9 mmol/L (ref 3.5–5.3)
SODIUM: 138 mmol/L (ref 135–146)
Total Bilirubin: 0.9 mg/dL (ref 0.2–1.2)
Total Protein: 7 g/dL (ref 6.1–8.1)

## 2015-11-28 LAB — LIPID PANEL
CHOL/HDL RATIO: 3.2 ratio (ref <=5.0)
Cholesterol: 114 mg/dL — ABNORMAL LOW (ref 125–200)
HDL: 36 mg/dL — ABNORMAL LOW (ref >=40)
LDL Cholesterol: 62 mg/dL (ref <130)
Triglycerides: 78 mg/dL (ref <150)
VLDL: 16 mg/dL (ref <30)

## 2015-12-01 ENCOUNTER — Other Ambulatory Visit: Payer: Self-pay

## 2015-12-01 DIAGNOSIS — N289 Disorder of kidney and ureter, unspecified: Secondary | ICD-10-CM

## 2015-12-04 ENCOUNTER — Ambulatory Visit
Admission: RE | Admit: 2015-12-04 | Discharge: 2015-12-04 | Disposition: A | Discharge status: Home / Self Care | Payer: Commercial Managed Care - HMO | Source: Ambulatory Visit | Attending: Internal Medicine | Admitting: Internal Medicine

## 2015-12-04 DIAGNOSIS — N2889 Other specified disorders of kidney and ureter: Secondary | ICD-10-CM | POA: Diagnosis not present

## 2015-12-04 DIAGNOSIS — N289 Disorder of kidney and ureter, unspecified: Secondary | ICD-10-CM

## 2015-12-07 ENCOUNTER — Other Ambulatory Visit: Payer: Self-pay

## 2015-12-07 DIAGNOSIS — N400 Enlarged prostate without lower urinary tract symptoms: Secondary | ICD-10-CM

## 2015-12-22 ENCOUNTER — Other Ambulatory Visit: Payer: Commercial Managed Care - HMO

## 2015-12-22 DIAGNOSIS — N400 Enlarged prostate without lower urinary tract symptoms: Secondary | ICD-10-CM | POA: Diagnosis not present

## 2015-12-23 LAB — PSA: PSA: 1.7 ng/mL (ref <=4.0)

## 2016-03-09 ENCOUNTER — Ambulatory Visit: Payer: Commercial Managed Care - HMO

## 2016-03-09 ENCOUNTER — Encounter: Payer: Commercial Managed Care - HMO | Admitting: Internal Medicine

## 2016-03-12 ENCOUNTER — Other Ambulatory Visit: Payer: Self-pay | Admitting: Internal Medicine

## 2016-03-15 ENCOUNTER — Other Ambulatory Visit: Payer: Self-pay | Admitting: Internal Medicine

## 2016-04-13 ENCOUNTER — Other Ambulatory Visit: Payer: Self-pay | Admitting: Internal Medicine

## 2016-04-16 ENCOUNTER — Other Ambulatory Visit: Payer: Self-pay | Admitting: Internal Medicine

## 2016-06-27 ENCOUNTER — Encounter: Payer: Self-pay | Admitting: Internal Medicine

## 2016-08-12 ENCOUNTER — Ambulatory Visit (INDEPENDENT_AMBULATORY_CARE_PROVIDER_SITE_OTHER): Payer: Medicare HMO

## 2016-08-12 VITALS — BP 138/78 | HR 69 | Temp 98.0°F | Ht 68.0 in | Wt 170.0 lb

## 2016-08-12 DIAGNOSIS — Z723 Lack of physical exercise: Secondary | ICD-10-CM

## 2016-08-12 DIAGNOSIS — Z0000 Encounter for general adult medical examination without abnormal findings: Secondary | ICD-10-CM

## 2016-08-12 DIAGNOSIS — Z135 Encounter for screening for eye and ear disorders: Secondary | ICD-10-CM

## 2016-08-12 NOTE — Patient Instructions (Addendum)
[name] , Thank you for taking time to come for your Medicare Wellness Visit. I appreciate your ongoing commitment to your health goals. Please review the following plan we discussed and let me know if I can assist you in the future.   Screening recommendations/referrals: Colonoscopy up to date. Due 10/10/2021 Recommended yearly ophthalmology/optometry visit for glaucoma screening and checkup Recommended yearly dental visit for hygiene and checkup  Vaccinations: Influenza vaccine up to date. Due 11/27/70 Pneumococcal vaccine up to date Tdap vaccine up to date. Due 01/25/2020 Shingles vaccine up to date. If you decide you want the new vaccine let us know and we will send prescription to you pharmacy  Advanced directives: Advance directive discussed with you today. I have provided a copy for you to complete at home and have notarized. Once this is complete please bring a copy in to our office so we can scan it into your chart.   Conditions/risks identified: None  Next appointment: Dr. Montez Moritaarter 08/18/70 @ 2:45pm  Preventive Care 65 Years and Older, Male Preventive care refers to lifestyle choices and visits with your health care provider that can promote health and wellness. What does preventive care include?  A yearly physical exam. This is also called an annual well check.  Dental exams once or twice a year.  Routine eye exams. Ask your health care provider how often you should have your eyes checked.  Personal lifestyle choices, including:  Daily care of your teeth and gums.  Regular physical activity.  Eating a healthy diet.  Avoiding tobacco and drug use.  Limiting alcohol use.  Practicing safe sex.  Taking low doses of aspirin every day.  Taking vitamin and mineral supplements as recommended by your health care provider. What happens during an annual well check? The services and screenings done by your health care provider during your annual well check will depend on  your age, overall health, lifestyle risk factors, and family history of disease. Counseling  Your health care provider may ask you questions about your:  Alcohol use.  Tobacco use.  Drug use.  Emotional well-being.  Home and relationship well-being.  Sexual activity.  Eating habits.  History of falls.  Memory and ability to understand (cognition).  Work and work Astronomerenvironment. Screening  You may have the following tests or measurements:  Height, weight, and BMI.  Blood pressure.  Lipid and cholesterol levels. These may be checked every 5 years, or more frequently if you are over 72 years old.  Skin check.  Lung cancer screening. You may have this screening every year starting at age 72 if you have a 30-pack-year history of smoking and currently smoke or have quit within the past 15 years.  Fecal occult blood test (FOBT) of the stool. You may have this test every year starting at age 72.  Flexible sigmoidoscopy or colonoscopy. You may have a sigmoidoscopy every 5 years or a colonoscopy every 10 years starting at age 72.  Prostate cancer screening. Recommendations will vary depending on your family history and other risks.  Hepatitis C blood test.  Hepatitis B blood test.  Sexually transmitted disease (STD) testing.  Diabetes screening. This is done by checking your blood sugar (glucose) after you have not eaten for a while (fasting). You may have this done every 1-3 years.  Abdominal aortic aneurysm (AAA) screening. You may need this if you are a current or former smoker.  Osteoporosis. You may be screened starting at age 72 if you are at high  risk. Talk with your health care provider about your test results, treatment options, and if necessary, the need for more tests. Vaccines  Your health care provider may recommend certain vaccines, such as:  Influenza vaccine. This is recommended every year.  Tetanus, diphtheria, and acellular pertussis (Tdap, Td) vaccine.  You may need a Td booster every 10 years.  Zoster vaccine. You may need this after age 55.  Pneumococcal 13-valent conjugate (PCV13) vaccine. One dose is recommended after age 42.  Pneumococcal polysaccharide (PPSV23) vaccine. One dose is recommended after age 30. Talk to your health care provider about which screenings and vaccines you need and how often you need them. This information is not intended to replace advice given to you by your health care provider. Make sure you discuss any questions you have with your health care provider. Document Released: 02/06/2015 Document Revised: 09/30/2015 Document Reviewed: 11/11/2014 Elsevier Interactive Patient Education  2017 [employer] Prevention in the Home Falls can cause injuries. They can happen to people of all ages. There are many things you can do to make your home safe and to help prevent falls. What can I do on the outside of my home?  Regularly fix the edges of walkways and driveways and fix any cracks.  Remove anything that might make you trip as you walk through a door, such as a raised step or threshold.  Trim any bushes or trees on the path to your home.  Use bright outdoor lighting.  Clear any walking paths of anything that might make someone trip, such as rocks or tools.  Regularly check to see if handrails are loose or broken. Make sure that both sides of any steps have handrails.  Any raised decks and porches should have guardrails on the edges.  Have any leaves, snow, or ice cleared regularly.  Use sand or salt on walking paths during winter.  Clean up any spills in your garage right away. This includes oil or grease spills. What can I do in the bathroom?  Use night lights.  Install grab bars by the toilet and in the tub and shower. Do not use towel bars as grab bars.  Use non-skid mats or decals in the tub or shower.  If you need to sit down in the shower, use a plastic, non-slip stool.  Keep the  floor dry. Clean up any water that spills on the floor as soon as it happens.  Remove soap buildup in the tub or shower regularly.  Attach bath mats securely with double-sided non-slip rug tape.  Do not have throw rugs and other things on the floor that can make you trip. What can I do in the bedroom?  Use night lights.  Make sure that you have a light by your bed that is easy to reach.  Do not use any sheets or blankets that are too big for your bed. They should not hang down onto the floor.  Have a firm chair that has side arms. You can use this for support while you get dressed.  Do not have throw rugs and other things on the floor that can make you trip. What can I do in the kitchen?  Clean up any spills right away.  Avoid walking on wet floors.  Keep items that you use a lot in easy-to-reach places.  If you need to reach something above you, use a strong step stool that has a grab bar.  Keep electrical cords out of the way.  Do not use floor polish or wax that makes floors slippery. If you must use wax, use non-skid floor wax.  Do not have throw rugs and other things on the floor that can make you trip. What can I do with my stairs?  Do not leave any items on the stairs.  Make sure that there are handrails on both sides of the stairs and use them. Fix handrails that are broken or loose. Make sure that handrails are as long as the stairways.  Check any carpeting to make sure that it is firmly attached to the stairs. Fix any carpet that is loose or worn.  Avoid having throw rugs at the top or bottom of the stairs. If you do have throw rugs, attach them to the floor with carpet tape.  Make sure that you have a light switch at the top of the stairs and the bottom of the stairs. If you do not have them, ask someone to add them for you. What else can I do to help prevent falls?  Wear shoes that:  Do not have high heels.  Have rubber bottoms.  Are comfortable and fit  you well.  Are closed at the toe. Do not wear sandals.  If you use a stepladder:  Make sure that it is fully opened. Do not climb a closed stepladder.  Make sure that both sides of the stepladder are locked into place.  Ask someone to hold it for you, if possible.  Clearly mark and make sure that you can see:  Any grab bars or handrails.  First and last steps.  Where the edge of each step is.  Use tools that help you move around (mobility aids) if they are needed. These include:  Canes.  Walkers.  Scooters.  Crutches.  Turn on the lights when you go into a dark area. Replace any light bulbs as soon as they burn out.  Set up your furniture so you have a clear path. Avoid moving your furniture around.  If any of your floors are uneven, fix them.  If there are any pets around you, be aware of where they are.  Review your medicines with your doctor. Some medicines can make you feel dizzy. This can increase your chance of falling. Ask your doctor what other things that you can do to help prevent falls. This information is not intended to replace advice given to you by your health care provider. Make sure you discuss any questions you have with your health care provider. Document Released: 11/06/2008 Document Revised: 06/18/2015 Document Reviewed: 02/14/2014 Elsevier Interactive Patient Education  2017 [employer].

## 2016-08-12 NOTE — Progress Notes (Signed)
Subjective:   Danny Winters is a 71 y.o. male who presents for Medicare Annual/Subsequent preventive examination.  Last AWV-02/15/11    Objective:    Vitals: BP 138/78 (BP Location: Left Arm, Patient Position: Sitting)   Pulse 69   Temp 98 F (36.7 C) (Oral)   Ht '5\' 8"'  (1.727 m)   Wt 170 lb (77.1 kg)   SpO2 98%   BMI 25.85 kg/m   Body mass index is 25.85 kg/m.  Tobacco History  Smoking Status  . Former Smoker  . Packs/day: 1.00  . Years: 11.00  . Quit date: 07/01/1973  Smokeless Tobacco  . Never Used     Counseling given: Not Answered   Past Medical History:  Diagnosis Date  . Lumbago   . Memory loss   . Other and unspecified hyperlipidemia   . Type II or unspecified type diabetes mellitus without mention of complication, uncontrolled   . Unspecified arthropathy, shoulder region   . Unspecified essential hypertension   . Unspecified hereditary and idiopathic peripheral neuropathy   . Unspecified vitamin D deficiency    Past Surgical History:  Procedure Laterality Date  . COLONOSCOPY  2005   Dr Sharlotte Alamo Medoff  . COLONOSCOPY W/ POLYPECTOMY  2012   [facility]- sessile polyp in transverse colon 4 mm and another 6 mm  . KIDNEY STONE SURGERY  2013   Dr Kathie Rhodes   Family History  Problem Relation Age of Onset  . Heart disease Mother   . Heart attack Father   . Cancer Sister        colon  . Cancer Brother        colon  . Cancer Brother        colon  . Cancer Brother        prostate  . Cancer Brother        prostate  . Cancer Brother        Pancreas  . Stroke Other    History  Sexual Activity  . Sexual activity: Not on file    Outpatient Encounter Prescriptions as of 08/12/2016  Medication Sig  . Alcohol Swabs (B-D SINGLE USE SWABS REGULAR) PADS USE AS DIRECTED WHEN CHECKING BLOOD SUGAR  . aspirin 81 MG tablet Take 81 mg by mouth daily.  . Blood Glucose Calibration (ACCU-CHEK AVIVA) SOLN Use as Directed Dx E11.65  . Blood Glucose  Monitoring Suppl (ACCU-CHEK ADVANTAGE DIABETES) kit Use machine to test blood sugar twice daily. Dx: E11.65  . calcium-vitamin D (OSCAL 500/200 D-3) 500-200 MG-UNIT per tablet Take 1 tablet by mouth 2 (two) times daily.  Marland Kitchen gabapentin (NEURONTIN) 100 MG capsule Take one capsule by mouth once daily for pains  . glucose blood (ACCU-CHEK AVIVA PLUS) test strip Use to check blood sugar twice daily. Dx E11.65  . irbesartan (AVAPRO) 75 MG tablet TAKE ONE TABLET BY MOUTH ONCE DAILY  . Lancet Devices (ACCU-CHEK SOFTCLIX) lancets Use to test blood sugar twice daily. Dx E11.65  . Lancets Misc. (ACCU-CHEK SOFTCLIX LANCET DEV) KIT Use as Directed to check blood sugar twice daily Dx E11.65  . meloxicam (MOBIC) 15 MG tablet Take one tablet by mouth once daily  . metFORMIN (GLUCOPHAGE) 1000 MG tablet TAKE ONE TABLET BY MOUTH TWICE DAILY FOR BLOOD SUGAR  . simvastatin (ZOCOR) 10 MG tablet TAKE ONE TABLET BY MOUTH ONCE DAILY FOR CHOLESTEROL  . [DISCONTINUED] metFORMIN (GLUCOPHAGE) 1000 MG tablet TAKE 1 TABLET BY MOUTH TWICE A DAY FOR BLOOD SUGAR   No  facility-administered encounter medications on file as of 08/12/2016.     Activities of Daily Living In your present state of health, do you have any difficulty performing the following activities: 08/12/2016  Hearing? N  Vision? Y  Difficulty concentrating or making decisions? N  Walking or climbing stairs? N  Dressing or bathing? N  Doing errands, shopping? N  Preparing Food and eating ? N  Using the Toilet? N  In the past six months, have you accidently leaked urine? N  Do you have problems with loss of bowel control? N  Managing your Medications? N  Managing your Finances? N  Housekeeping or managing your Housekeeping? N  Some recent data might be hidden    Patient Care Team: Gildardo Cranker, DO as PCP - General (Internal Medicine)   Assessment:     Exercise Activities and Dietary recommendations Current Exercise Habits: The patient does not  participate in regular exercise at present, Exercise limited by: None identified  Goals    . Exercise 2x per week (30 min per time)          Pt will look in to silver sneakers and workout 2 times a week.      Fall Risk Fall Risk  08/12/2016 11/27/2015 06/19/2015 03/13/2015 01/23/2015  Falls in the past year? No No No No No  Number falls in past yr: - - - - -  Injury with Fall? - - - - -   Depression Screen PHQ 2/9 Scores 08/12/2016 06/19/2015 11/20/2013 07/17/2013  PHQ - 2 Score 0 0 0 0    Cognitive Function MMSE - Mini Mental State Exam 08/12/2016 07/17/2013  Orientation to time 5 5  Orientation to Place 5 5  Registration 3 3  Attention/ Calculation 5 4  Recall 0 1  Language- name 2 objects 2 2  Language- repeat 1 1  Language- follow 3 step command 2 3  Language- read & follow direction 1 1  Write a sentence 1 1  Copy design 1 1  Total score 26 27        Immunization History  Administered Date(s) Administered  . Influenza,inj,Quad PF,36+ Mos 11/13/2012, 11/20/2013, 01/23/2015, 11/27/2015  . Influenza-Unspecified 11/10/2011  . Pneumococcal Conjugate-13 04/08/2014  . Pneumococcal Polysaccharide-23 10/01/2008, 11/13/2012  . Tdap 01/24/2010   Screening Tests Health Maintenance  Topic Date Due  . Hepatitis C Screening  1945/10/29  . FOOT EXAM  07/18/2014  . OPHTHALMOLOGY EXAM  07/18/2014  . HEMOGLOBIN A1C  05/26/2016  . INFLUENZA VACCINE  08/24/2016  . TETANUS/TDAP  01/25/2020  . COLONOSCOPY  12/13/2020  . PNA vac Low Risk Adult  Completed      Plan:    I have personally reviewed and addressed the Medicare Annual Wellness questionnaire and have noted the following in the patient's chart:  A. Medical and social history B. Use of alcohol, tobacco or illicit drugs  C. Current medications and supplements D. Functional ability and status E.  Nutritional status F.  Physical activity G. Advance directives H. List of other physicians I.  Hospitalizations, surgeries,  and ER visits in previous 12 months J.  [place] to include hearing, vision, cognitive, depression L. Referrals and appointments - opthamology  In addition, I have reviewed and discussed with patient certain preventive protocols, quality metrics, and best practice recommendations. A written personalized care plan for preventive services as well as general preventive health recommendations were provided to patient.  See attached scanned questionnaire for additional information.   Signed,  Rich Reining, RN Nurse Health Advisor   Quick Notes   Health Maintenance: Referral for ophthamology. Up to date     Abnormal Screen: MMSE 26/30. Did not pass clock drawing.     Patient Concerns: None     Nurse Concerns: Referral for C3-silver sneaker

## 2016-08-17 ENCOUNTER — Encounter: Payer: Commercial Managed Care - HMO | Admitting: Internal Medicine

## 2016-08-17 ENCOUNTER — Ambulatory Visit: Payer: Commercial Managed Care - HMO

## 2016-08-26 ENCOUNTER — Other Ambulatory Visit: Payer: Self-pay | Admitting: *Deleted

## 2016-08-26 MED ORDER — ACCU-CHEK SOFTCLIX LANCET DEV MISC: 3 refills | Status: DC

## 2016-08-26 MED ORDER — GLUCOSE BLOOD VI STRP: ORAL_STRIP | 3 refills | Status: DC

## 2016-08-26 NOTE — Telephone Encounter (Signed)
[facility] 

## 2016-08-29 ENCOUNTER — Encounter: Payer: Self-pay | Admitting: Internal Medicine

## 2016-08-30 ENCOUNTER — Other Ambulatory Visit: Payer: Self-pay

## 2016-08-30 ENCOUNTER — Encounter: Payer: Medicare HMO | Admitting: Internal Medicine

## 2016-08-30 MED ORDER — ACCU-CHEK SOFTCLIX LANCET DEV MISC: 6 refills | Status: DC

## 2016-08-30 MED ORDER — GLUCOSE BLOOD VI STRP: ORAL_STRIP | 6 refills | Status: DC

## 2016-08-30 NOTE — Telephone Encounter (Signed)
A refill request was received from [facility] for Accu-chek Aviva Plus test strips and softclix lancets. Rx for each were sent to pharmacy electronically.

## 2016-10-18 ENCOUNTER — Other Ambulatory Visit: Payer: Self-pay | Admitting: Internal Medicine

## 2016-12-06 ENCOUNTER — Other Ambulatory Visit: Payer: Self-pay | Admitting: Internal Medicine

## 2017-01-12 ENCOUNTER — Other Ambulatory Visit: Payer: Self-pay | Admitting: Internal Medicine

## 2017-01-24 HISTORY — PX: INGUINAL HERNIA REPAIR: SUR1180

## 2017-03-30 ENCOUNTER — Other Ambulatory Visit: Payer: Self-pay | Admitting: Internal Medicine

## 2017-04-18 ENCOUNTER — Encounter: Payer: Self-pay | Admitting: Internal Medicine

## 2017-04-18 ENCOUNTER — Ambulatory Visit (INDEPENDENT_AMBULATORY_CARE_PROVIDER_SITE_OTHER): Payer: Medicare HMO | Admitting: Internal Medicine

## 2017-04-18 VITALS — BP 134/88 | HR 82 | Temp 98.1°F | Ht 68.0 in | Wt 175.0 lb

## 2017-04-18 DIAGNOSIS — Z23 Encounter for immunization: Secondary | ICD-10-CM

## 2017-04-18 DIAGNOSIS — I10 Essential (primary) hypertension: Secondary | ICD-10-CM | POA: Diagnosis not present

## 2017-04-18 DIAGNOSIS — M545 Low back pain: Secondary | ICD-10-CM

## 2017-04-18 DIAGNOSIS — Z79899 Other long term (current) drug therapy: Secondary | ICD-10-CM | POA: Diagnosis not present

## 2017-04-18 DIAGNOSIS — E785 Hyperlipidemia, unspecified: Secondary | ICD-10-CM

## 2017-04-18 DIAGNOSIS — E1142 Type 2 diabetes mellitus with diabetic polyneuropathy: Secondary | ICD-10-CM | POA: Diagnosis not present

## 2017-04-18 DIAGNOSIS — G8929 Other chronic pain: Secondary | ICD-10-CM

## 2017-04-18 MED ORDER — GABAPENTIN 100 MG PO CAPS: 100.0000 mg | ORAL_CAPSULE | Freq: Every day | ORAL | 1 refills | Status: AC

## 2017-04-18 MED ORDER — SIMVASTATIN 10 MG PO TABS: ORAL_TABLET | ORAL | 1 refills | Status: DC

## 2017-04-18 MED ORDER — MELOXICAM 15 MG PO TABS: ORAL_TABLET | ORAL | 1 refills | Status: DC

## 2017-04-18 MED ORDER — IRBESARTAN 75 MG PO TABS
75.0000 mg | ORAL_TABLET | Freq: Every day | ORAL | 1 refills | Status: DC
Start: 2017-04-18 — End: 2017-05-30

## 2017-04-18 MED ORDER — METFORMIN HCL 1000 MG PO TABS: ORAL_TABLET | ORAL | 1 refills | Status: DC

## 2017-04-18 NOTE — Progress Notes (Signed)
Patient ID: Danny Winters, male   DOB: 26-Jun-1945, 72 y.o.   MRN: [id number]   Location:  [facility] OFFICE  Provider: DR Arletha Grippe  Goals of Care:  Advanced Directives 08/12/2016  Does Patient Have a Medical Advance Directive? No  Would patient like information on creating a medical advance directive? Yes (MAU/Ambulatory/Procedural Areas - Information given)  Pre-existing out of facility DNR order (yellow form or pink MOST form) -     Chief Complaint  Patient presents with  . Medical Management of Chronic Issues    1 year follow-up, AWV completed 08/12/16. Foot exam due  . Health Maintenance    Eye exam due- referral pending   . Immunizations    Flu vacine today     HPI: Patient is a 72 y.o. male seen today for medical management of chronic diseases.  He has not been seen by a provider since 11/27/2015. AWV completed 08/12/16. He is thinking about downsizing into an apt but will need to sell his home. He continues to travel and sing.  HTN -  controlled on avapro. benicar worked but ran out and was unable to afford "deductible" $109 for RF. He takes ASA daily.   DM - BS 90-low 100s. No low BS reactions. He is taking metformin. Neuropathy stable on gabapentin.  A1c 7.1% in 2017. Urine micro/Cr ratio 9.8 in 2016   Hyperlipidemia - stable on simvastatin. No myalgias. LDL 62  Mild cognitive impairment - unchanged. currently not taking any meds. Occasionally forgets where he lies down objects  Vit D deficiency - stable on calcium w D  Joint pain in lower back - stable on meloxicam prn. He has occasional flares. No sciatica. No numbness/tingling. He wears a back brace some times  Past Medical History:  Diagnosis Date  . Lumbago   . Memory loss   . Other and unspecified hyperlipidemia   . Type II or unspecified type diabetes mellitus without mention of complication, uncontrolled   . Unspecified arthropathy, shoulder region   . Unspecified essential hypertension   . Unspecified  hereditary and idiopathic peripheral neuropathy   . Unspecified vitamin D deficiency     Past Surgical History:  Procedure Laterality Date  . COLONOSCOPY  2005   Dr Sharlotte Alamo Medoff  . COLONOSCOPY W/ POLYPECTOMY  2012   [facility]- sessile polyp in transverse colon 4 mm and another 6 mm  . KIDNEY STONE SURGERY  2013   Dr Kathie Rhodes     reports that he quit smoking about 43 years ago. He has a 11.00 pack-year smoking history. He has never used smokeless tobacco. He reports that he does not drink alcohol or use drugs. Social History   Socioeconomic History  . Marital status: Married    Spouse name: Not on file  . Number of children: Not on file  . Years of education: Not on file  . Highest education level: Not on file  Occupational History  . Not on file  Social Needs  . Financial resource strain: Not on file  . Food insecurity:    Worry: Not on file    Inability: Not on file  . Transportation needs:    Medical: Not on file    Non-medical: Not on file  Tobacco Use  . Smoking status: Former Smoker    Packs/day: 1.00    Years: 11.00    Pack years: 11.00    Last attempt to quit: 07/01/1973    Years since quitting: 43.8  . Smokeless  tobacco: Never Used  Substance and Sexual Activity  . Alcohol use: No  . Drug use: No  . Sexual activity: Not on file  Lifestyle  . Physical activity:    Days per week: Not on file    Minutes per session: Not on file  . Stress: Not on file  Relationships  . Social connections:    Talks on phone: Not on file    Gets together: Not on file    Attends religious service: Not on file    Active member of club or organization: Not on file    Attends meetings of clubs or organizations: Not on file    Relationship status: Not on file  . Intimate partner violence:    Fear of current or ex partner: Not on file    Emotionally abused: Not on file    Physically abused: Not on file    Forced sexual activity: Not on file  Other Topics Concern    . Not on file  Social History Narrative  . Not on file    Family History  Problem Relation Age of Onset  . Heart disease Mother   . Heart attack Father   . Cancer Sister        colon  . Cancer Brother        colon  . Cancer Brother        colon  . Cancer Brother        prostate  . Cancer Brother        prostate  . Cancer Brother        Pancreas  . Stroke Other     Allergies  Allergen Reactions  . Lisinopril     Outpatient Encounter Medications as of 04/18/2017  Medication Sig  . Alcohol Swabs (B-D SINGLE USE SWABS REGULAR) PADS USE AS DIRECTED WHEN CHECKING BLOOD SUGAR  . aspirin 81 MG tablet Take 81 mg by mouth daily.  . calcium-vitamin D (OSCAL 500/200 D-3) 500-200 MG-UNIT per tablet Take 1 tablet by mouth 2 (two) times daily.  Marland Kitchen gabapentin (NEURONTIN) 100 MG capsule Take 100 mg by mouth as needed.  . irbesartan (AVAPRO) 75 MG tablet TAKE ONE TABLET BY MOUTH ONCE DAILY  . meloxicam (MOBIC) 15 MG tablet Take one tablet by mouth once daily  . metFORMIN (GLUCOPHAGE) 1000 MG tablet TAKE 1 TABLET TWICE DAILY FOR BLOOD SUGAR  . Psyllium (FP FIBER LAXATIVE PO) ONCE DAILY AS NEEDED  . simvastatin (ZOCOR) 10 MG tablet TAKE 1 TABLET ONE TIME DAILY FOR CHOLESTEROL  . [DISCONTINUED] Blood Glucose Calibration (ACCU-CHEK AVIVA) SOLN Use as Directed Dx E11.65 (Patient not taking: Reported on 04/18/2017)  . [DISCONTINUED] Blood Glucose Monitoring Suppl (ACCU-CHEK ADVANTAGE DIABETES) kit Use machine to test blood sugar twice daily. Dx: E11.65 (Patient not taking: Reported on 04/18/2017)  . [DISCONTINUED] gabapentin (NEURONTIN) 100 MG capsule Take one capsule by mouth once daily for pains  . [DISCONTINUED] glucose blood (ACCU-CHEK AVIVA PLUS) test strip Use to check blood sugar twice daily. Dx E11.65 (Patient not taking: Reported on 04/18/2017)  . [DISCONTINUED] Lancet Devices (ACCU-CHEK SOFTCLIX) lancets Use to test blood sugar twice daily. Dx E11.65  . [DISCONTINUED] Lancets Misc.  (ACCU-CHEK SOFTCLIX LANCET DEV) KIT Use as Directed to check blood sugar twice daily Dx E11.65   No facility-administered encounter medications on file as of 04/18/2017.     Review of Systems:  Review of Systems  Musculoskeletal: Positive for arthralgias and back pain.  All other systems reviewed and  are negative.   Health Maintenance  Topic Date Due  . FOOT EXAM  07/18/2014  . OPHTHALMOLOGY EXAM  07/18/2014  . HEMOGLOBIN A1C  05/26/2016  . INFLUENZA VACCINE  08/24/2016  . Hepatitis C Screening  04/19/2018 (Originally [birth year])  . TETANUS/TDAP  01/25/2020  . COLONOSCOPY  12/13/2020  . PNA vac Low Risk Adult  Completed    Physical Exam: Vitals:   04/18/17 1111  BP: 134/88  Pulse: 82  Temp: 98.1 F (36.7 C)  TempSrc: Oral  SpO2: 97%  Weight: 175 lb (79.4 kg)  Height: '5\' 8"'  (1.727 m)   Body mass index is 26.61 kg/m. Physical Exam  Constitutional: He is oriented to person, place, and time. He appears well-developed and well-nourished.  HENT:  Mouth/Throat: Oropharynx is clear and moist.  MMM; no oral thrush  Eyes: Pupils are equal, round, and reactive to light. No scleral icterus.  Neck: Neck supple. Carotid bruit is not present. No thyromegaly present.  Cardiovascular: Normal rate, regular rhythm and intact distal pulses. Exam reveals no gallop and no friction rub.  Murmur (1/6 SEM) heard. No distal LE edema. No calf TTP  Pulmonary/Chest: Effort normal and breath sounds normal. He has no wheezes. He has no rales. He exhibits no tenderness.  Abdominal: Soft. Normal appearance and bowel sounds are normal. He exhibits no distension, no abdominal bruit, no pulsatile midline mass and no mass. There is no hepatomegaly. There is no tenderness. There is no rigidity, no rebound and no guarding. No hernia.  Musculoskeletal: He exhibits edema.  Lymphadenopathy:    He has no cervical adenopathy.  Neurological: He is alert and oriented to person, place, and time. He has normal  reflexes.  Skin: Skin is warm and dry. No rash noted.  Psychiatric: He has a normal mood and affect. His behavior is normal. Judgment and thought content normal.   Diabetic Foot Exam - Simple   Simple Foot Form Diabetic Foot exam was performed with the following findings:  Yes 04/18/2017 12:25 PM  Visual Inspection No deformities, no ulcerations, no other skin breakdown bilaterally:  Yes Sensation Testing Intact to touch and monofilament testing bilaterally:  Yes Pulse Check Posterior Tibialis and Dorsalis pulse intact bilaterally:  Yes Comments      Labs reviewed: Basic Metabolic Panel: No results for input(s): NA, K, CL, CO2, GLUCOSE, BUN, CREATININE, CALCIUM, MG, PHOS, TSH in the last 8760 hours. Liver Function Tests: No results for input(s): AST, ALT, ALKPHOS, BILITOT, PROT, ALBUMIN in the last 8760 hours. No results for input(s): LIPASE, AMYLASE in the last 8760 hours. No results for input(s): AMMONIA in the last 8760 hours. CBC: No results for input(s): WBC, NEUTROABS, HGB, HCT, MCV, PLT in the last 8760 hours. Lipid Panel: No results for input(s): CHOL, HDL, LDLCALC, TRIG, CHOLHDL, LDLDIRECT in the last 8760 hours. Lab Results  Component Value Date   HGBA1C 7.1 (H) 11/27/2015    Procedures since last visit: No results found.  Assessment/Plan   ICD-10-CM   1. 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 Essential hypertension I10 irbesartan (AVAPRO) 75 MG tablet    CBC with Differential/Platelets  3. Hyperlipidemia LDL goal <100 E78.5 Lipid Panel    TSH    simvastatin (ZOCOR) 10 MG tablet  4.  Chronic low back pain, unspecified back pain laterality, with sciatica presence unspecified M54.5 meloxicam (MOBIC) 15 MG tablet   G89.29     5. High risk medication use Z79.899 CMP with eGFR([facility])    CBC with Differential/Platelets    NEW SCRIPTS SENT TODAY FOR YOUR MEDICATIONS  FLU SHOT GIVEN TODAY  Will call with lab results  Follow up in 4 mos for CPE/ECG/AWV (after August 12, 2017)  Jaydian Santana S. Perlie Gold  [facility] [address] [place], [place] [address] [phone] Cell (Monday-Friday 8 AM - 5 PM) [phone] After 5 PM and follow prompts

## 2017-04-18 NOTE — Patient Instructions (Addendum)
NEW SCRIPTS SENT TODAY FOR YOUR MEDICATIONS  FLU SHOT GIVEN TODAY  Will call with lab results  Follow up in 4 mos for CPE/ECG/AWV (after August 12, 2017)

## 2017-04-19 LAB — LIPID PANEL
CHOLESTEROL: 120 mg/dL (ref <200)
HDL: 41 mg/dL (ref >40)
LDL CHOLESTEROL (CALC): 65 mg/dL
Non-HDL Cholesterol (Calc): 79 mg/dL (calc) (ref <130)
TRIGLYCERIDES: 61 mg/dL (ref <150)
Total CHOL/HDL Ratio: 2.9 (calc) (ref <5.0)

## 2017-04-19 LAB — CBC WITH DIFFERENTIAL/PLATELET
BASOS ABS: 29 {cells}/uL (ref 0–200)
Basophils Relative: 0.7 %
Eosinophils Absolute: 49 cells/uL (ref 15–500)
Eosinophils Relative: 1.2 %
HEMATOCRIT: 45 % (ref 38.5–50.0)
Hemoglobin: 15.5 g/dL (ref 13.2–17.1)
LYMPHS ABS: 1517 {cells}/uL (ref 850–3900)
MCH: 28.8 pg (ref 27.0–33.0)
MCHC: 34.4 g/dL (ref 32.0–36.0)
MCV: 83.5 fL (ref 80.0–100.0)
MPV: 10 fL (ref 7.5–12.5)
Monocytes Relative: 8.5 %
NEUTROS PCT: 52.6 %
Neutro Abs: 2157 cells/uL (ref 1500–7800)
PLATELETS: 188 10*3/uL (ref 140–400)
RBC: 5.39 10*6/uL (ref 4.20–5.80)
RDW: 13.1 % (ref 11.0–15.0)
TOTAL LYMPHOCYTE: 37 %
WBC: 4.1 10*3/uL (ref 3.8–10.8)
WBCMIX: 349 {cells}/uL (ref 200–950)

## 2017-04-19 LAB — COMPLETE METABOLIC PANEL WITH GFR
AG Ratio: 1.6 (calc) (ref 1.0–2.5)
ALT: 10 U/L (ref 9–46)
AST: 19 U/L (ref 10–35)
Albumin: 4.4 g/dL (ref 3.6–5.1)
Alkaline phosphatase (APISO): 51 U/L (ref 40–115)
BUN: 17 mg/dL (ref 7–25)
CALCIUM: 9.5 mg/dL (ref 8.6–10.3)
CHLORIDE: 101 mmol/L (ref 98–110)
CO2: 27 mmol/L (ref 20–32)
Creat: 1.16 mg/dL (ref 0.70–1.18)
GFR, EST AFRICAN AMERICAN: 73 mL/min/{1.73_m2} (ref >=60)
GFR, Est Non African American: 63 mL/min/{1.73_m2} (ref >=60)
GLUCOSE: 122 mg/dL (ref 65–139)
Globulin: 2.8 g/dL (calc) (ref 1.9–3.7)
Potassium: 4.1 mmol/L (ref 3.5–5.3)
Sodium: 136 mmol/L (ref 135–146)
TOTAL PROTEIN: 7.2 g/dL (ref 6.1–8.1)
Total Bilirubin: 1.1 mg/dL (ref 0.2–1.2)

## 2017-04-19 LAB — URINALYSIS, ROUTINE W REFLEX MICROSCOPIC
Bilirubin Urine: NEGATIVE
GLUCOSE, UA: NEGATIVE
HGB URINE DIPSTICK: NEGATIVE
KETONES UR: NEGATIVE
Leukocytes, UA: NEGATIVE
NITRITE: NEGATIVE
PH: 6 (ref 5.0–8.0)
Protein, ur: NEGATIVE
Specific Gravity, Urine: 1.021 (ref 1.001–1.03)

## 2017-04-19 LAB — HEMOGLOBIN A1C
HEMOGLOBIN A1C: 7.4 %{Hb} — AB (ref <5.7)
MEAN PLASMA GLUCOSE: 166 (calc)
eAG (mmol/L): 9.2 (calc)

## 2017-04-19 LAB — MICROALBUMIN / CREATININE URINE RATIO
CREATININE, URINE: 195 mg/dL (ref 20–320)
MICROALB UR: 0.7 mg/dL
Microalb Creat Ratio: 4 mcg/mg creat (ref <30)

## 2017-04-19 LAB — TSH: TSH: 1.89 m[IU]/L (ref 0.40–4.50)

## 2017-05-30 ENCOUNTER — Other Ambulatory Visit: Payer: Self-pay | Admitting: Internal Medicine

## 2017-06-09 ENCOUNTER — Encounter: Payer: Self-pay | Admitting: Internal Medicine

## 2017-06-16 ENCOUNTER — Other Ambulatory Visit: Payer: Self-pay | Admitting: Internal Medicine

## 2017-06-20 ENCOUNTER — Other Ambulatory Visit: Payer: Self-pay | Admitting: *Deleted

## 2017-06-20 MED ORDER — ACCU-CHEK AVIVA PLUS W/DEVICE KIT: PACK | 0 refills | Status: DC

## 2017-06-20 NOTE — Telephone Encounter (Signed)
[facility] 

## 2017-08-18 ENCOUNTER — Ambulatory Visit: Payer: Medicare HMO | Admitting: Internal Medicine

## 2017-08-24 ENCOUNTER — Other Ambulatory Visit: Payer: Self-pay | Admitting: Internal Medicine

## 2017-08-24 NOTE — Telephone Encounter (Signed)
Spoke with patient, patient did not request refill

## 2017-09-05 IMAGING — US US RENAL
1 series · 14 of 25 positions shown · non-contrast
Comparison: None.

CLINICAL DATA: Decreased renal function

EXAM:
RENAL / URINARY TRACT ULTRASOUND COMPLETE

[Series 1: us renal · 0.30mm/px · 14 of 46 slices shown]
[im 1/46]
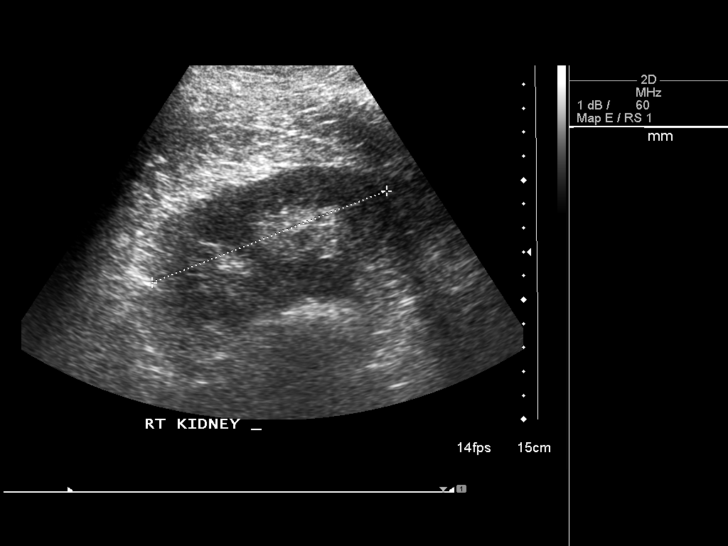
[im 4/46]
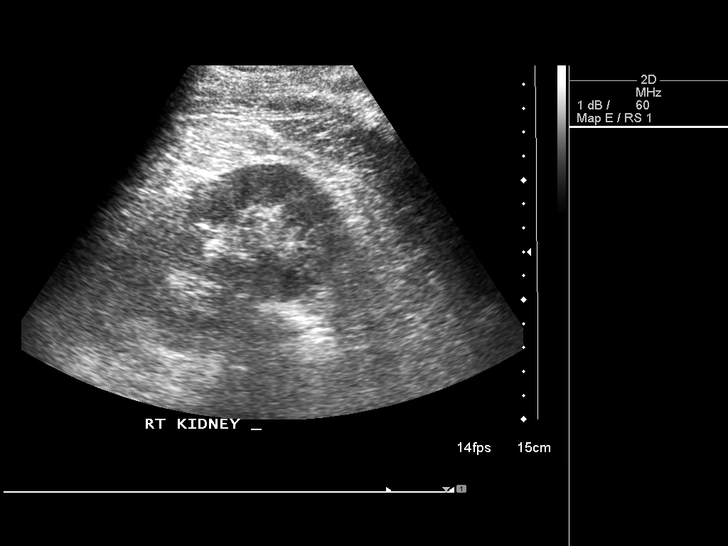
[im 8/46]
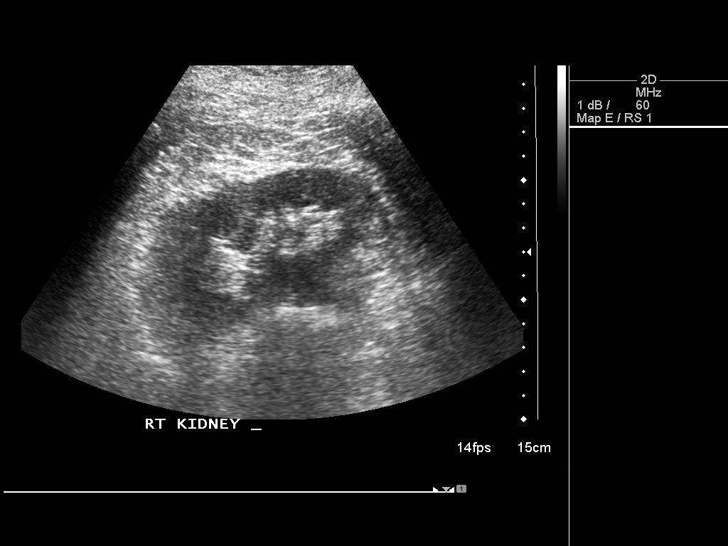
[im 12/46]
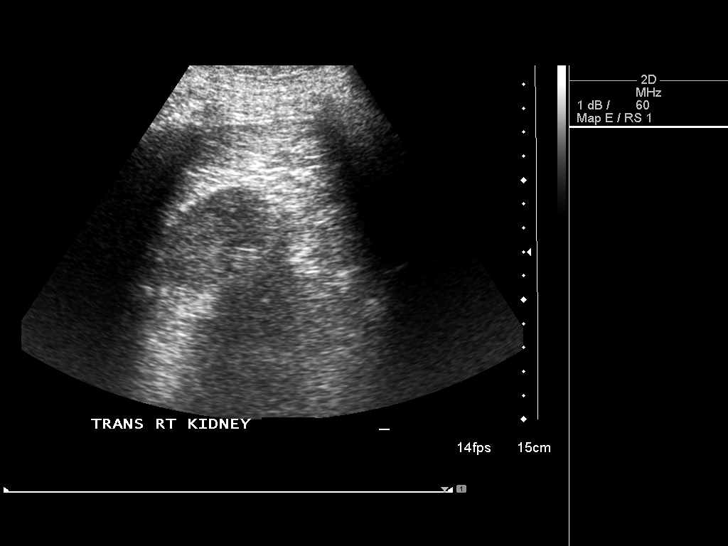
[im 16/46]
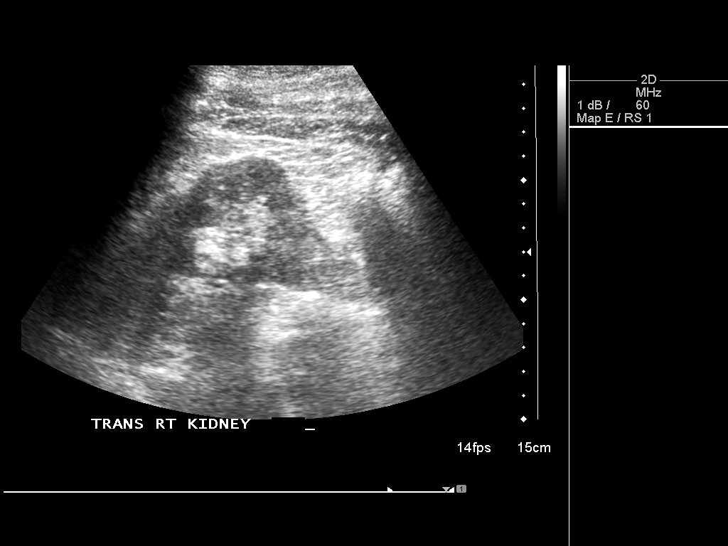
[im 17/46]
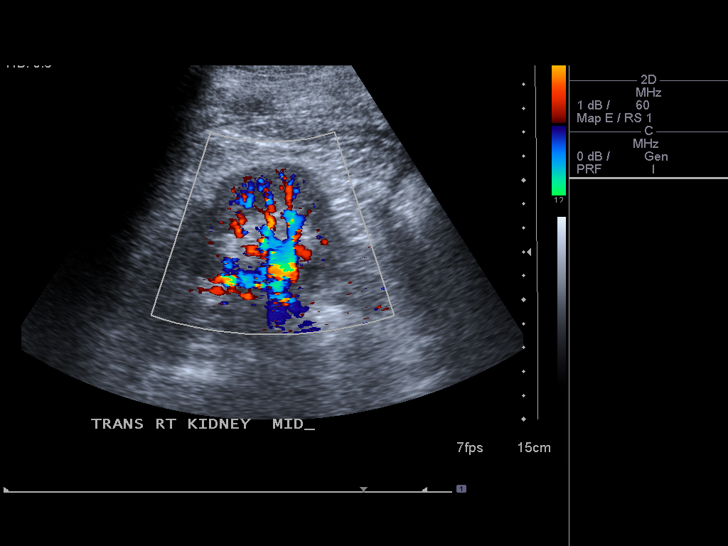
[im 21/46]
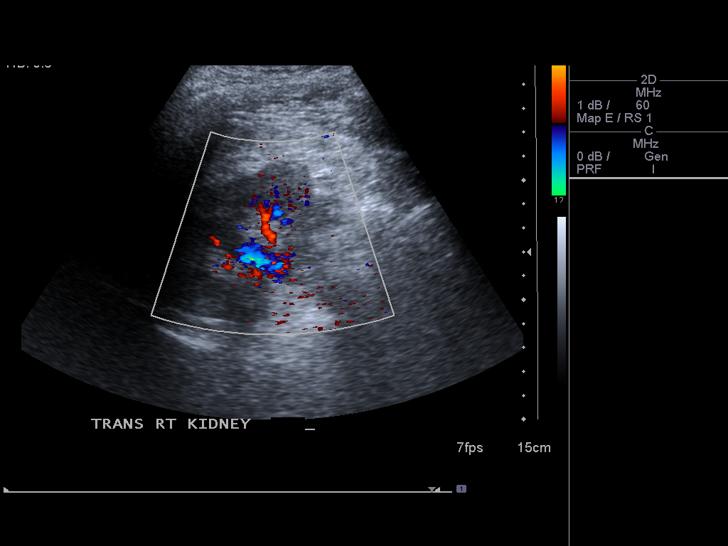
[im 25/46]
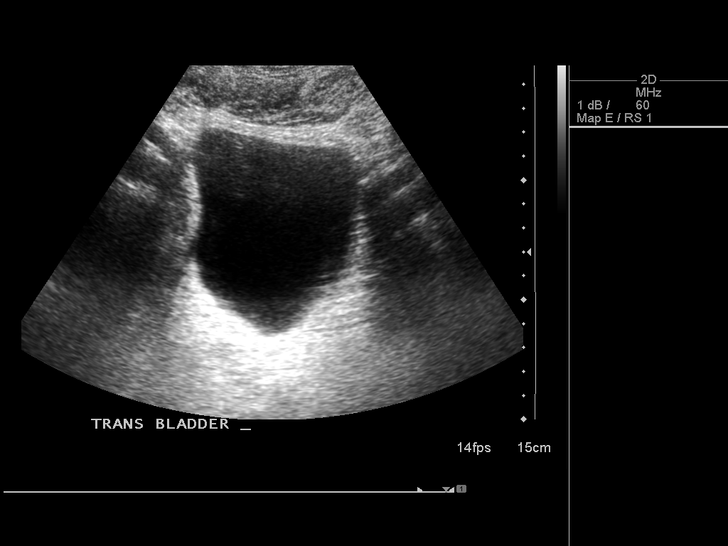
[im 29/46]
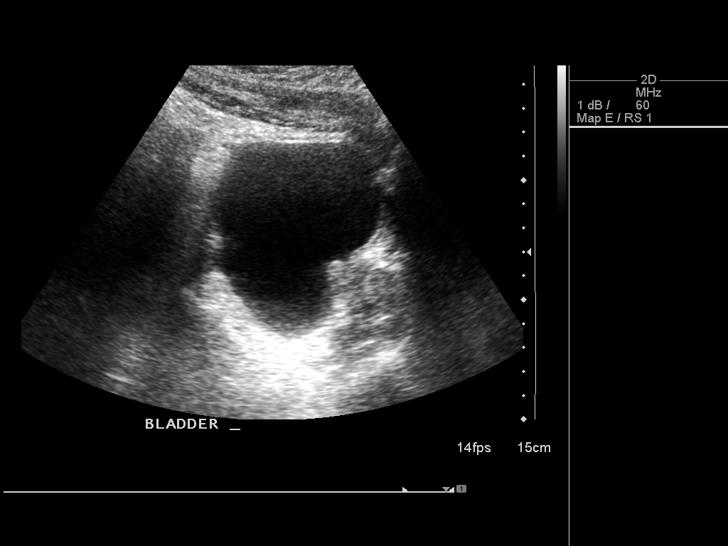
[im 31/46]
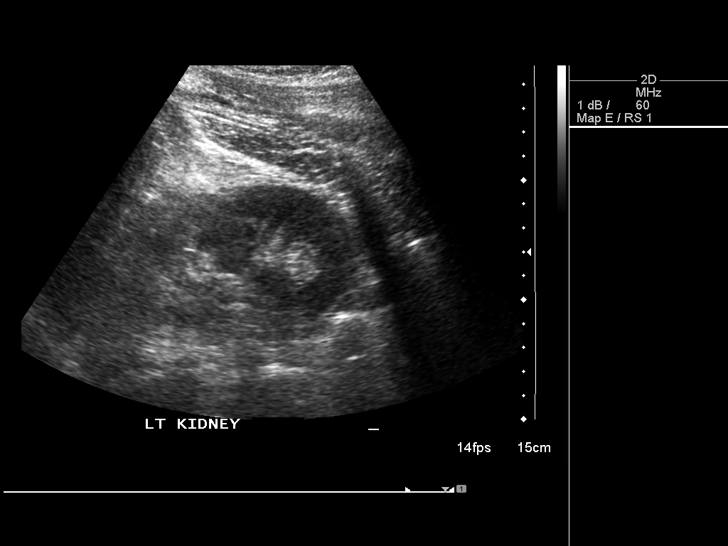
[im 34/46]
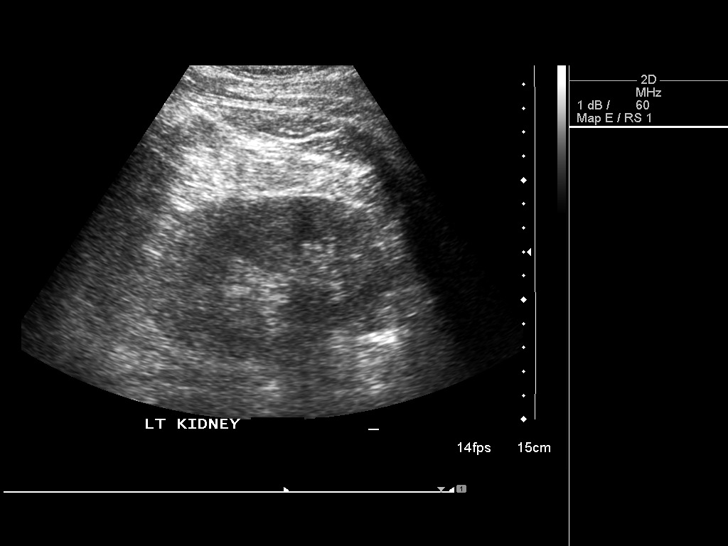
[im 38/46]
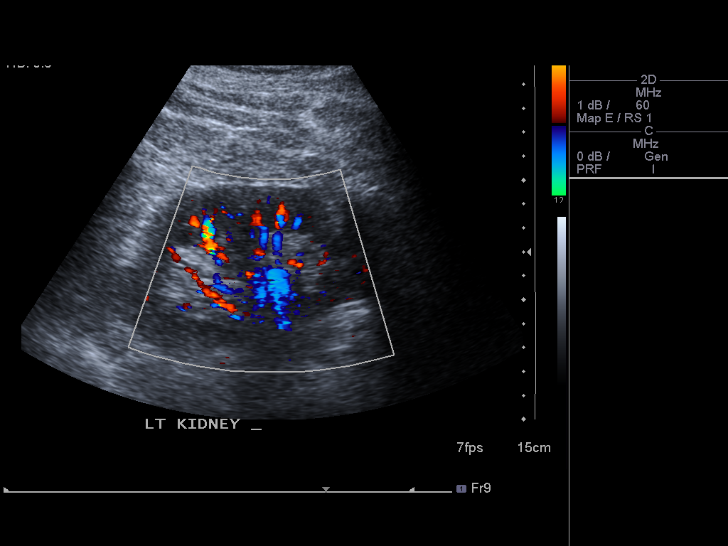
[im 42/46]
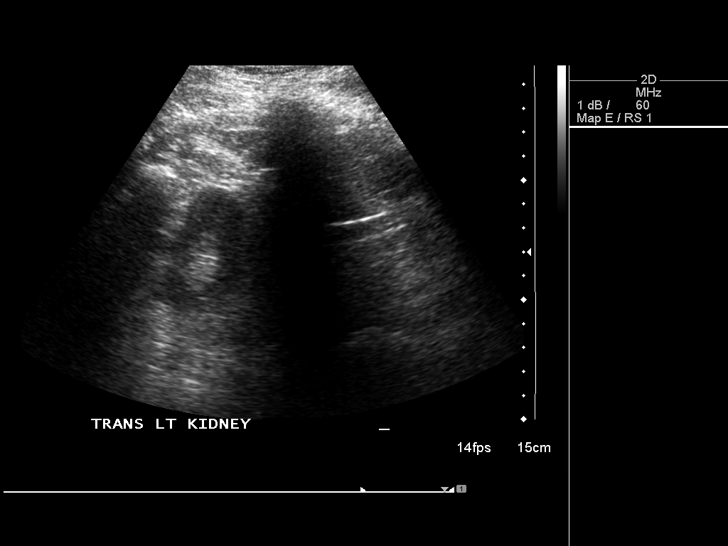
[im 46/46]
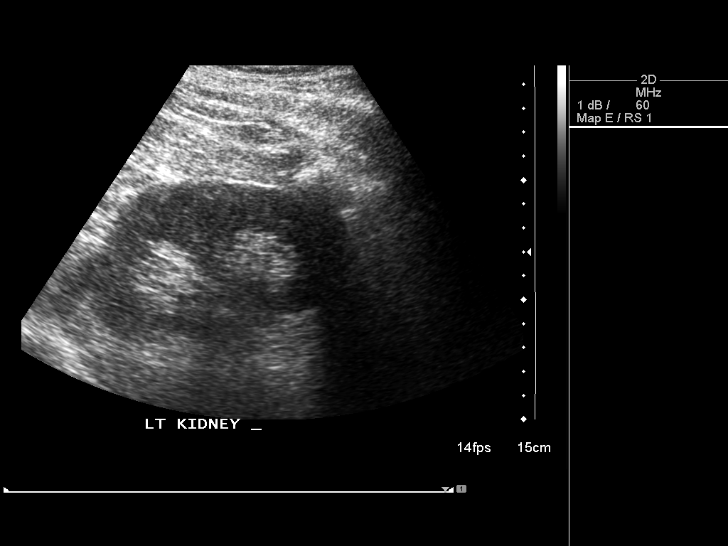

[14 of 25 positions shown; findings below may reference images not displayed]

FINDINGS: Right Kidney:

Length: 10.5 cm. Echogenicity within normal limits. No mass or
hydronephrosis visualized.

Left Kidney:

Length: 11 cm. Echogenicity within normal limits. No mass or
hydronephrosis visualized.

Bladder:

Appears normal for degree of bladder distention. Enlarged prostate
gland with indentation of urinary bladder base.
IMPRESSION: 1. No hydronephrosis. No renal calculi. Enlarged prostate gland with
indentation of urinary bladder base.

## 2017-09-13 ENCOUNTER — Encounter: Payer: Self-pay | Admitting: Internal Medicine

## 2017-09-15 ENCOUNTER — Ambulatory Visit: Payer: Medicare HMO | Admitting: Internal Medicine

## 2017-09-20 ENCOUNTER — Other Ambulatory Visit: Payer: Self-pay | Admitting: Internal Medicine

## 2017-09-20 DIAGNOSIS — E1142 Type 2 diabetes mellitus with diabetic polyneuropathy: Secondary | ICD-10-CM

## 2017-09-20 DIAGNOSIS — E785 Hyperlipidemia, unspecified: Secondary | ICD-10-CM

## 2017-09-20 DIAGNOSIS — G8929 Other chronic pain: Secondary | ICD-10-CM

## 2017-09-20 DIAGNOSIS — M545 Low back pain: Secondary | ICD-10-CM

## 2017-09-27 ENCOUNTER — Telehealth: Payer: Self-pay | Admitting: Internal Medicine

## 2017-09-27 NOTE — Telephone Encounter (Signed)
Left msg asking pt to confirm this AWV-S w/ [place] Dec. VDM (DD)

## 2017-10-08 ENCOUNTER — Other Ambulatory Visit: Payer: Self-pay

## 2017-10-08 ENCOUNTER — Emergency Department (HOSPITAL_COMMUNITY)
Admission: EM | Admit: 2017-10-08 | Discharge: 2017-10-09 | Disposition: A | Discharge status: Home / Self Care | Payer: Medicare HMO | Attending: Emergency Medicine | Admitting: Emergency Medicine

## 2017-10-08 ENCOUNTER — Encounter (HOSPITAL_COMMUNITY): Payer: Self-pay

## 2017-10-08 ENCOUNTER — Emergency Department (HOSPITAL_COMMUNITY): Payer: Medicare HMO

## 2017-10-08 DIAGNOSIS — I119 Hypertensive heart disease without heart failure: Secondary | ICD-10-CM | POA: Insufficient documentation

## 2017-10-08 DIAGNOSIS — Z7982 Long term (current) use of aspirin: Secondary | ICD-10-CM | POA: Diagnosis not present

## 2017-10-08 DIAGNOSIS — Z79899 Other long term (current) drug therapy: Secondary | ICD-10-CM | POA: Diagnosis not present

## 2017-10-08 DIAGNOSIS — E1142 Type 2 diabetes mellitus with diabetic polyneuropathy: Secondary | ICD-10-CM | POA: Insufficient documentation

## 2017-10-08 DIAGNOSIS — Z87891 Personal history of nicotine dependence: Secondary | ICD-10-CM | POA: Diagnosis not present

## 2017-10-08 DIAGNOSIS — K4090 Unilateral inguinal hernia, without obstruction or gangrene, not specified as recurrent: Secondary | ICD-10-CM | POA: Insufficient documentation

## 2017-10-08 DIAGNOSIS — Z7984 Long term (current) use of oral hypoglycemic drugs: Secondary | ICD-10-CM | POA: Diagnosis not present

## 2017-10-08 DIAGNOSIS — K4030 Unilateral inguinal hernia, with obstruction, without gangrene, not specified as recurrent: Secondary | ICD-10-CM | POA: Diagnosis not present

## 2017-10-08 DIAGNOSIS — R1032 Left lower quadrant pain: Secondary | ICD-10-CM | POA: Diagnosis present

## 2017-10-08 LAB — LACTIC ACID, PLASMA: LACTIC ACID, VENOUS: 1.6 mmol/L (ref 0.5–1.9)

## 2017-10-08 LAB — CBC
HCT: 42.7 % (ref 39.0–52.0)
Hemoglobin: 14.6 g/dL (ref 13.0–17.0)
MCH: 29.6 pg (ref 26.0–34.0)
MCHC: 34.2 g/dL (ref 30.0–36.0)
MCV: 86.6 fL (ref 78.0–100.0)
PLATELETS: 178 10*3/uL (ref 150–400)
RBC: 4.93 MIL/uL (ref 4.22–5.81)
RDW: 12.7 % (ref 11.5–15.5)
WBC: 8.8 10*3/uL (ref 4.0–10.5)

## 2017-10-08 LAB — COMPREHENSIVE METABOLIC PANEL
ALBUMIN: 4.3 g/dL (ref 3.5–5.0)
ALT: 16 U/L (ref 0–44)
AST: 26 U/L (ref 15–41)
Alkaline Phosphatase: 49 U/L (ref 38–126)
Anion gap: 11 (ref 5–15)
BILIRUBIN TOTAL: 1.4 mg/dL — AB (ref 0.3–1.2)
BUN: 24 mg/dL — AB (ref 8–23)
CO2: 25 mmol/L (ref 22–32)
CREATININE: 1.18 mg/dL (ref 0.61–1.24)
Calcium: 9.6 mg/dL (ref 8.9–10.3)
Chloride: 103 mmol/L (ref 98–111)
GFR calc Af Amer: >60 mL/min (ref >60)
GFR, EST NON AFRICAN AMERICAN: 60 mL/min — AB (ref >60)
GLUCOSE: 169 mg/dL — AB (ref 70–99)
Potassium: 3.8 mmol/L (ref 3.5–5.1)
Sodium: 139 mmol/L (ref 135–145)
TOTAL PROTEIN: 7.9 g/dL (ref 6.5–8.1)

## 2017-10-08 LAB — URINALYSIS, ROUTINE W REFLEX MICROSCOPIC
BACTERIA UA: NONE SEEN
BILIRUBIN URINE: NEGATIVE
Glucose, UA: 50 mg/dL — AB
Ketones, ur: 20 mg/dL — AB
LEUKOCYTES UA: NEGATIVE
NITRITE: NEGATIVE
PH: 5 (ref 5.0–8.0)
Protein, ur: NEGATIVE mg/dL
SPECIFIC GRAVITY, URINE: 1.021 (ref 1.005–1.030)

## 2017-10-08 LAB — LIPASE, BLOOD: Lipase: 41 U/L (ref 11–51)

## 2017-10-08 MED ORDER — MORPHINE SULFATE (PF) 4 MG/ML IV SOLN
4.0000 mg | Freq: Once | INTRAVENOUS | Status: AC
  Administered 2017-10-08: 4 mg via INTRAVENOUS
  Filled 2017-10-08: qty 1

## 2017-10-08 MED ORDER — ONDANSETRON 4 MG PO TBDP: 4.0000 mg | ORAL_TABLET | Freq: Once | ORAL | Status: DC | PRN

## 2017-10-08 MED ORDER — IOPAMIDOL (ISOVUE-300) INJECTION 61%
100.0000 mL | Freq: Once | INTRAVENOUS | Status: AC | PRN
  Administered 2017-10-08: 100 mL via INTRAVENOUS

## 2017-10-08 MED ORDER — IOPAMIDOL (ISOVUE-300) INJECTION 61%
INTRAVENOUS | Status: AC
  Filled 2017-10-08: qty 100

## 2017-10-08 MED ORDER — ONDANSETRON HCL 4 MG/2ML IJ SOLN
4.0000 mg | Freq: Once | INTRAMUSCULAR | Status: AC
  Administered 2017-10-08: 4 mg via INTRAVENOUS
  Filled 2017-10-08: qty 2

## 2017-10-08 MED ORDER — FENTANYL CITRATE (PF) 100 MCG/2ML IJ SOLN
100.0000 ug | Freq: Once | INTRAMUSCULAR | Status: AC
  Administered 2017-10-08: 100 ug via INTRAVENOUS
  Filled 2017-10-08: qty 2

## 2017-10-08 MED ORDER — SODIUM CHLORIDE 0.9 % IV BOLUS
1000.0000 mL | Freq: Once | INTRAVENOUS | Status: AC
  Administered 2017-10-08: 1000 mL via INTRAVENOUS

## 2017-10-08 NOTE — ED Triage Notes (Signed)
Pt reports abdominal pain and L sided groin swelling that started this morning. Denies hx of hernia. He endorses N/V. Vomited once in triage. A&Px4. Ambulatory.

## 2017-10-08 NOTE — ED Notes (Signed)
Bed: [id number] Expected date:  Expected time:  Means of arrival:  Comments: TR 4 Danny Winters

## 2017-10-08 NOTE — ED Provider Notes (Signed)
[facility] DEPT Provider Note   CSN: [id number] Arrival date & time: 10/08/17  1841     History   Chief Complaint Chief Complaint  Patient presents with  . Abdominal Pain  . Groin Swelling    HPI Danny Winters is a 72 y.o. male.  72 yo M with a chief complaint of abdominal pain and a left-sided groin mass.  This been going on for the past week.  The groin mass is been there for quite some time.  He is not sure if it usually goes away or not.  Has had a doctor look at it previously but did not remember what they told him it was.  He knows it is not cancer.  Denies biopsy to previously.  He has had some nausea and generalized fatigue.  Planing also of constipation for about 5 days time.  Normally has a bowel movement every other day.  Denies fevers or chills.  Has had a couple episodes of vomiting.  Denies chest pain or shortness of breath.  The history is provided by the patient.  Abdominal Pain   This is a new problem. The current episode started more than 2 days ago. The problem occurs constantly. The problem has been gradually worsening. The pain is located in the generalized abdominal region. The quality of the pain is cramping and sharp. The pain is at a severity of 8/10. The pain is moderate. Associated symptoms include vomiting and constipation. Pertinent negatives include fever, diarrhea, headaches, arthralgias and myalgias. Nothing aggravates the symptoms. Nothing relieves the symptoms.    Past Medical History:  Diagnosis Date  . Lumbago   . Memory loss   . Other and unspecified hyperlipidemia   . Type II or unspecified type diabetes mellitus without mention of complication, uncontrolled   . Unspecified arthropathy, shoulder region   . Unspecified essential hypertension   . Unspecified hereditary and idiopathic peripheral neuropathy   . Unspecified vitamin D deficiency     Patient Active Problem List   Diagnosis Date Noted  . Essential  hypertension 01/23/2015  . Advanced care planning/counseling discussion 04/08/2014  . Memory loss or impairment 11/20/2013  . Mild cognitive impairment 07/17/2013  . Smoking history 07/17/2013  . Vitamin D deficiency 11/13/2012  . Diabetic neuropathy ([place]) 11/13/2012  . DM type 2 with diabetic peripheral neuropathy ([place]) 11/13/2012  . Benign hypertensive heart disease without heart failure 11/13/2012  . Hyperlipidemia LDL goal <100 11/13/2012    Past Surgical History:  Procedure Laterality Date  . COLONOSCOPY  2005   Dr Sharlotte [place] Medoff  . COLONOSCOPY W/ POLYPECTOMY  2012   [facility]- sessile polyp in transverse colon 4 mm and another 6 mm  . KIDNEY STONE SURGERY  2013   Dr Kathie Rhodes        Home Medications    Prior to Admission medications   Medication Sig Start Date End Date Taking? Authorizing Provider  Alcohol Swabs (B-D SINGLE USE SWABS REGULAR) PADS USE AS DIRECTED WHEN CHECKING BLOOD SUGAR Patient taking differently: 1 each by Other route See admin instructions. Use as directed when checking blood sugar 06/16/17  Yes Gildardo Cranker, DO  aspirin 81 MG tablet Take 81 mg by mouth daily.   Yes [provider]  Blood Glucose Monitoring Suppl (ACCU-CHEK AVIVA PLUS) w/Device KIT Use to test blood sugar twice daily. Dx: E11.42 06/20/17  Yes Gildardo Cranker, DO  gabapentin (NEURONTIN) 100 MG capsule Take 1 capsule (100 mg total) by mouth at  bedtime. Patient taking differently: Take 100 mg by mouth at bedtime as needed (pain).  04/18/17  Yes Eulas Post, Monica, DO  irbesartan (AVAPRO) 75 MG tablet TAKE ONE TABLET BY MOUTH ONCE DAILY Patient taking differently: Take 75 mg by mouth daily.  05/30/17  Yes Eulas Post, Monica, DO  meloxicam (MOBIC) 15 MG tablet TAKE 1 TABLET EVERY DAY  FOR  JOINT  PAIN AND BACK PAIN Patient taking differently: Take 15 mg by mouth daily as needed for pain.  09/20/17  Yes Eulas Post, Monica, DO  metFORMIN (GLUCOPHAGE) 1000 MG tablet TAKE 1 TABLET TWICE  DAILY FOR BLOOD SUGAR Patient taking differently: Take 1,000 mg by mouth 2 (two) times daily with a meal.  09/20/17  Yes Eulas Post, Monica, DO  simvastatin (ZOCOR) 10 MG tablet TAKE 1 TABLET ONE TIME DAILY FOR CHOLESTEROL Patient taking differently: Take 10 mg by mouth daily at 6 PM.  09/20/17  Yes Gildardo Cranker, DO  calcium-vitamin D (OSCAL 500/200 D-3) 500-200 MG-UNIT per tablet Take 1 tablet by mouth 2 (two) times daily. Patient not taking: Reported on 10/08/2017 11/20/13   Blanchie Serve, MD    Family History Family History  Problem Relation Age of Onset  . Heart disease Mother   . Heart attack Father   . Cancer Sister        colon  . Cancer Brother        colon  . Cancer Brother        colon  . Cancer Brother        prostate  . Cancer Brother        prostate  . Cancer Brother        Pancreas  . Stroke Other     Social History Social History   Tobacco Use  . Smoking status: Former Smoker    Packs/day: 1.00    Years: 11.00    Pack years: 11.00    Last attempt to quit: 07/01/1973    Years since quitting: 44.3  . Smokeless tobacco: Never Used  Substance Use Topics  . Alcohol use: No  . Drug use: No     Allergies   Lisinopril   Review of Systems Review of Systems  Constitutional: Negative for chills and fever.  HENT: Negative for congestion and facial swelling.   Eyes: Negative for discharge and visual disturbance.  Respiratory: Negative for shortness of breath.   Cardiovascular: Negative for chest pain and palpitations.  Gastrointestinal: Positive for abdominal pain, constipation and vomiting. Negative for diarrhea.  Musculoskeletal: Negative for arthralgias and myalgias.  Skin: Negative for color change and rash.  Neurological: Negative for tremors, syncope and headaches.  Psychiatric/Behavioral: Negative for confusion and dysphoric mood.     Physical Exam Updated Vital Signs BP 135/71 (BP Location: Left Arm)   Pulse 96   Temp (!) 97.5 F (36.4 C)  (Oral)   Resp 16   Ht '5\' 7"'  (1.702 m)   Wt 74.8 kg   SpO2 96%   BMI 25.84 kg/m   Physical Exam  Constitutional: He is oriented to person, place, and time. He appears well-developed and well-nourished.  HENT:  Head: Normocephalic and atraumatic.  Eyes: Pupils are equal, round, and reactive to light. EOM are normal.  Neck: Normal range of motion. Neck supple. No JVD present.  Cardiovascular: Normal rate and regular rhythm. Exam reveals no gallop and no friction rub.  No murmur heard. Pulmonary/Chest: No respiratory distress. He has no wheezes.  Abdominal: He exhibits no distension. There is generalized  tenderness (diffuse). There is no rebound and no guarding. A hernia is present. Hernia confirmed positive in the left inguinal area.  Somewhat tense hernia without erythema mildly tender to palpation in the left direct inguinal region.  Testicles palpated in a separate location.  Musculoskeletal: Normal range of motion.  Neurological: He is alert and oriented to person, place, and time.  Skin: No rash noted. No pallor.  Psychiatric: He has a normal mood and affect. His behavior is normal.  Nursing note and vitals reviewed.    ED Treatments / Results  Labs (all labs ordered are listed, but only abnormal results are displayed) Labs Reviewed  COMPREHENSIVE METABOLIC PANEL - Abnormal; Notable for the following components:      Result Value   Glucose, Bld 169 (*)    BUN 24 (*)    Total Bilirubin 1.4 (*)    GFR calc non Af Amer 60 (*)    All other components within normal limits  URINALYSIS, ROUTINE W REFLEX MICROSCOPIC - Abnormal; Notable for the following components:   Glucose, UA 50 (*)    Hgb urine dipstick SMALL (*)    Ketones, ur 20 (*)    All other components within normal limits  LIPASE, BLOOD  CBC  LACTIC ACID, PLASMA    EKG None  Radiology Ct Abdomen Pelvis W Contrast  Result Date: 10/08/2017 CLINICAL DATA:  Left inguinal hernia. Increasing pain. EXAM: CT ABDOMEN  AND PELVIS WITH CONTRAST TECHNIQUE: Multidetector CT imaging of the abdomen and pelvis was performed using the standard protocol following bolus administration of intravenous contrast. CONTRAST:  158m ISOVUE-300 IOPAMIDOL (ISOVUE-300) INJECTION 61% COMPARISON:  Ultrasound kidneys 12/04/2015 FINDINGS: Lower chest: Calcified granuloma in the right lung base. Hepatobiliary: No focal liver abnormality is seen. No gallstones, gallbladder wall thickening, or biliary dilatation. Pancreas: Unremarkable. No pancreatic ductal dilatation or surrounding inflammatory changes. Spleen: Normal in size without focal abnormality. Adrenals/Urinary Tract: Adrenal glands are unremarkable. Kidneys are normal, without renal calculi, focal lesion, or hydronephrosis. Bladder wall is mildly thickened. This could be due to cystitis or under distention. Stomach/Bowel: Stomach, small bowel, and colon are not abnormally distended. Scattered stool throughout the colon. No wall thickening or inflammatory changes. There is a moderate-sized left inguinal hernia containing a portion of small bowel and fluid. Herniated contents measure about 4.3 cm in diameter. No proximal obstruction or ischemic changes although the hernia neck appears quite small. Appendix is normal. Vascular/Lymphatic: Aortic atherosclerosis. No enlarged abdominal or pelvic lymph nodes. Reproductive: Prostate is unremarkable. Other: No abdominal wall hernia or abnormality. No abdominopelvic ascites. Musculoskeletal: Degenerative changes in the spine. No destructive bone lesions. IMPRESSION: 1. Left inguinal hernia containing small bowel and fluid. No proximal obstruction or ischemic changes although the hernia neck appears quite small. 2. Mild diffuse bladder wall thickening may indicate cystitis or under distention. 3. Calcified granuloma in the right lung base. Aortic Atherosclerosis (ICD10-I70.0). Electronically Signed   By: WLucienne CapersM.D.   On: 10/08/2017 21:45     Procedures Procedures (including critical care time)  Medications Ordered in ED Medications  ondansetron (ZOFRAN-ODT) disintegrating tablet 4 mg (has no administration in time range)  iopamidol (ISOVUE-300) 61 % injection (has no administration in time range)  fentaNYL (SUBLIMAZE) injection 100 mcg (100 mcg Intravenous Given 10/08/17 2016)  ondansetron (ZOFRAN) injection 4 mg (4 mg Intravenous Given 10/08/17 2016)  sodium chloride 0.9 % bolus 1,000 mL (0 mLs Intravenous Stopped 10/08/17 2117)  iopamidol (ISOVUE-300) 61 % injection 100 mL (100 mLs Intravenous  Contrast Given 10/08/17 2117)  morphine 4 MG/ML injection 4 mg (4 mg Intravenous Given 10/08/17 2208)  ondansetron (ZOFRAN) injection 4 mg (4 mg Intravenous Given 10/08/17 2207)     Initial Impression / Assessment and Plan / ED Course  I have reviewed the triage vital signs and the nursing notes.  Pertinent labs & imaging results that were available during my care of the patient were reviewed by me and considered in my medical decision making (see chart for details).     72 yo M with a chief complaint of abdominal pain.  Patient has a left direct inguinal hernia.  This may be the cause of his symptoms attempted to reduce the bedside and was not successful initially.  Will place an ice pack give a dose of pain medicine and reassess.  After multiple attempts I was unable to reduce the patient's inguinal hernia.  CT scan was performed which showed no inflammation to the bowel but a very narrow neck to the hernia.  I discussed the case with Dr. Lucia Gaskins.  Hernia reduced by general surgery.  Discharge home.  12:03 AM:  I have discussed the diagnosis/risks/treatment options with the patient and family and believe the pt to be eligible for discharge home to follow-up with PCP. We also discussed returning to the ED immediately if new or worsening sx occur. We discussed the sx which are most concerning (e.g., sudden worsening pain, fever,  inability to tolerate by mouth) that necessitate immediate return. Medications administered to the patient during their visit and any new prescriptions provided to the patient are listed below.  Medications given during this visit Medications  ondansetron (ZOFRAN-ODT) disintegrating tablet 4 mg (has no administration in time range)  iopamidol (ISOVUE-300) 61 % injection (has no administration in time range)  fentaNYL (SUBLIMAZE) injection 100 mcg (100 mcg Intravenous Given 10/08/17 2016)  ondansetron (ZOFRAN) injection 4 mg (4 mg Intravenous Given 10/08/17 2016)  sodium chloride 0.9 % bolus 1,000 mL (0 mLs Intravenous Stopped 10/08/17 2117)  iopamidol (ISOVUE-300) 61 % injection 100 mL (100 mLs Intravenous Contrast Given 10/08/17 2117)  morphine 4 MG/ML injection 4 mg (4 mg Intravenous Given 10/08/17 2208)  ondansetron (ZOFRAN) injection 4 mg (4 mg Intravenous Given 10/08/17 2207)      The patient appears reasonably screen and/or stabilized for discharge and I doubt any other medical condition or other [facility] requiring further screening, evaluation, or treatment in the ED at this time prior to discharge.    Final Clinical Impressions(s) / ED Diagnoses   Final diagnoses:  Direct inguinal hernia    ED Discharge Orders    None       Deno Etienne, DO 10/09/17 0003

## 2017-10-08 NOTE — Discharge Instructions (Signed)
Follow up with the general surgeon in the office for eval for possible repair.  Return for worsening pain, redness over the area.

## 2017-10-08 NOTE — H&P (Signed)
 Re:   Danny Winters DOB:   01/15/1946 MRN:   [id number]  Chief Complaint Left groin pain  ASSESEMENT AND PLAN: 1.  Incarcerated left inguinal hernia  I was able to reduce this hernia in the ER.  His daughter, Danielle Baccari, was with him.  He needs to call our office tomorrow to set up an open left inguinal hernia repair.  I gave them information about calling.  He should also come by the office to pick up a book on hernias.  I discussed the indications and complications of hernia surgery with the patient.  I discussed both the laparoscopic and open approach to hernia repair..  The potential risks of hernia surgery include, but are not limited to, bleeding, infection, open surgery, nerve injury, and recurrence of the hernia.    2.  DM x 4 years 3.  HTN x 4 years 4.  Hyperlipidemia 5.  Mild cognitive impairment  Though he seemed pretty appropriate to me. 6.  Chronic back pain 7.  Atherosclerosis  Chief Complaint  Patient presents with  . Abdominal Pain  . Groin Swelling   PHYSICIAN REQUESTING CONSULTATION:  Dr. D. Floyd, [facility]  HISTORY OF PRESENT ILLNESS: Danny Winters is a 71 y.o. (DOB: 05/01/1945)  AA male whose primary care physician is Carter, Monica, DO.  His daughter, Danielle Teston, was with him.   The patient has had a known hernia for years he says.  But he has not sought any surgical evaluation for this hernia.  His daughter was unaware he had the hernia.  The patient went to church today, but then early this afternoon had severe left groin pain.  Hernia was larger than what he is used to.  He said the hernia sticks out a lot anyway.  Because of the pain and enlargement he came to the [facility] emergency room.  He denies any history of stomach, liver, colon disease.  He had a colonoscopy about 6 years ago by an unknown physician.  He has had no prior abdominal surgery.  CT scan of abdomen - 10/08/2017 - 1. Left inguinal hernia containing small bowel and fluid. No  proximal obstruction or ischemic changes although the hernia neck appears quite small.  2. Mild diffuse bladder wall thickening may indicate cystitis or under distention.  3. Calcified granuloma in the right lung base.  4.  Aortic Atherosclerosis    Past Medical History:  Diagnosis Date  . Lumbago   . Memory loss   . Other and unspecified hyperlipidemia   . Type II or unspecified type diabetes mellitus without mention of complication, uncontrolled   . Unspecified arthropathy, shoulder region   . Unspecified essential hypertension   . Unspecified hereditary and idiopathic peripheral neuropathy   . Unspecified vitamin D deficiency       Past Surgical History:  Procedure Laterality Date  . COLONOSCOPY  2005   Dr Jefferey Medoff  . COLONOSCOPY W/ POLYPECTOMY  2012   [facility]- sessile polyp in transverse colon 4 mm and another 6 mm  . KIDNEY STONE SURGERY  2013   Dr Mark Ottelin      Current Facility-Administered Medications  Medication Dose Route Frequency Provider Last Rate Last Dose  . iopamidol (ISOVUE-300) 61 % injection           . ondansetron (ZOFRAN-ODT) disintegrating tablet 4 mg  4 mg Oral Once PRN Floyd, Dan, DO       Current Outpatient Medications  Medication Sig Dispense Refill  .   Alcohol Swabs (B-D SINGLE USE SWABS REGULAR) PADS USE AS DIRECTED WHEN CHECKING BLOOD SUGAR (Patient taking differently: 1 each by Other route See admin instructions. Use as directed when checking blood sugar) 300 each 0  . aspirin 81 MG tablet Take 81 mg by mouth daily.    . Blood Glucose Monitoring Suppl (ACCU-CHEK AVIVA PLUS) w/Device KIT Use to test blood sugar twice daily. Dx: E11.42 1 kit 0  . gabapentin (NEURONTIN) 100 MG capsule Take 1 capsule (100 mg total) by mouth at bedtime. (Patient taking differently: Take 100 mg by mouth at bedtime as needed (pain). ) 90 capsule 1  . irbesartan (AVAPRO) 75 MG tablet TAKE ONE TABLET BY MOUTH ONCE DAILY (Patient taking differently: Take  75 mg by mouth daily. ) 90 tablet 1  . meloxicam (MOBIC) 15 MG tablet TAKE 1 TABLET EVERY DAY  FOR  JOINT  PAIN AND BACK PAIN (Patient taking differently: Take 15 mg by mouth daily as needed for pain. ) 90 tablet 1  . metFORMIN (GLUCOPHAGE) 1000 MG tablet TAKE 1 TABLET TWICE DAILY FOR BLOOD SUGAR (Patient taking differently: Take 1,000 mg by mouth 2 (two) times daily with a meal. ) 180 tablet 1  . simvastatin (ZOCOR) 10 MG tablet TAKE 1 TABLET ONE TIME DAILY FOR CHOLESTEROL (Patient taking differently: Take 10 mg by mouth daily at 6 PM. ) 90 tablet 1  . calcium-vitamin D (OSCAL 500/200 D-3) 500-200 MG-UNIT per tablet Take 1 tablet by mouth 2 (two) times daily. (Patient not taking: Reported on 10/08/2017) 60 tablet 3      Allergies  Allergen Reactions  . Lisinopril Itching    REVIEW OF SYSTEMS: Skin:  No history of rash.  No history of abnormal moles. Infection:  No history of hepatitis or HIV.  No history of MRSA. Neurologic:  In his chart, there is a mention of mild cognitive impairment, but I found him pretty appropriate. Cardiac:  HTN x 4 years.  No history of heart disease.  No history of prior cardiac catheterization.  No history of seeing a cardiologist. Pulmonary:  Does not smoke cigarettes.  No asthma or bronchitis.  No OSA/CPAP.  Endocrine:  DM x 4 years.  No thyroid disease. Gastrointestinal:  See HPI Urologic:  No history of kidney stones.  No history of bladder infections. Musculoskeletal:  No history of joint or back disease. Hematologic:  No bleeding disorder.  No history of anemia.  Not anticoagulated. Psycho-social:  The patient is oriented.   The patient has no obvious psychologic or social impairment to understanding our conversation and plan.  SOCIAL and FAMILY HISTORY: Divorced. Lives with daughter, Danielle Volkov.  Her left left is in a cast from recent left leg surgery. He has 4 children. 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  No family  history of hernia.  PHYSICAL EXAM: BP (!) 163/88 (BP Location: Left Arm)   Pulse 88   Temp (!) 97.5 F (36.4 C) (Oral)   Resp 14   Ht 5' 7" (1.702 m)   Wt 74.8 kg   SpO2 99%   BMI 25.84 kg/m   General: Older AA M who is alert and generally healthy appearing.  Skin:  Inspection and palpation - no mass or rash. Eyes:  Conjunctiva and lids unremarkable.            Pupils are equal Ears, Nose, Mouth, and Throat:  Ears and nose unremarkable              Lips and teeth are unremarable. Neck: Supple. No mass, trachea midline.  No thyroid mass. Lymph Nodes:  No supraclavicular, cervical, or inguinal nodes. Lungs: Normal respiratory effort.  Clear to auscultation and symmetric breath sounds. Heart:  Palpation of the heart is normal.            Auscultation: RRR. No murmur or rub.  Abdomen: Soft. He has bowel sounds.  No abdominal scars.  He has a 3 x 5 cm left inguinal mass consistent with an incarcerated hernia.  I was able to reduce the hernia at bedside.  He felt instant relief. Rectal: Not done. Musculoskeletal:  Good muscle strength and ROM  in upper and lower extremities.  Neurologic:  Grossly intact to motor and sensory function. Psychiatric: Normal judgement and insight. Behavior is normal.            Oriented to time, person, place.   DATA REVIEWED, COUNSELING AND COORDINATION OF CARE: Epic notes reviewed. Counseling and coordination of care exceeded more than 50% of the time spent with patient. Total time spent with patient and charting: 45 minutes.  Alphonsa Overall, MD,  [facility], [address]  [place], [place]    [place] Phone:  [phone] FAX:  [phone]

## 2017-10-11 ENCOUNTER — Telehealth: Payer: Self-pay

## 2017-10-11 NOTE — Telephone Encounter (Signed)
Spoke with patient, patient aware we are able to see records from ER visit for we are on the same electronic medical records system as [facility]. Patient will follow-up with surgeon and call us if needed prior to pending appointment on 10/23/17

## 2017-10-11 NOTE — Telephone Encounter (Signed)
-----   Message from [place] D Mccain sent at 10/10/2017 11:07 AM EDT ----- Regarding: Pt Question I called the pt to schedule his AWV w/ [facility].  He asked if we had received the notes from when he went to the ER this past Sunday, 10/08/17.  He is wondering if he needs to be seen before his 10/23/16 appt. VDM (DD)

## 2017-10-23 ENCOUNTER — Ambulatory Visit: Payer: Medicare HMO

## 2017-10-23 ENCOUNTER — Ambulatory Visit: Payer: Self-pay | Admitting: Nurse Practitioner

## 2017-10-31 DIAGNOSIS — K4090 Unilateral inguinal hernia, without obstruction or gangrene, not specified as recurrent: Secondary | ICD-10-CM | POA: Diagnosis not present

## 2017-11-01 ENCOUNTER — Ambulatory Visit: Payer: Self-pay

## 2017-11-16 DIAGNOSIS — K4030 Unilateral inguinal hernia, with obstruction, without gangrene, not specified as recurrent: Secondary | ICD-10-CM | POA: Diagnosis not present

## 2017-11-16 DIAGNOSIS — G8918 Other acute postprocedural pain: Secondary | ICD-10-CM | POA: Diagnosis not present

## 2017-12-12 ENCOUNTER — Other Ambulatory Visit: Payer: Self-pay

## 2017-12-12 MED ORDER — BD SWAB SINGLE USE REGULAR PADS: MEDICATED_PAD | 3 refills | Status: DC

## 2017-12-18 ENCOUNTER — Ambulatory Visit (INDEPENDENT_AMBULATORY_CARE_PROVIDER_SITE_OTHER): Payer: Medicare HMO | Admitting: Nurse Practitioner

## 2017-12-18 ENCOUNTER — Encounter: Payer: Self-pay | Admitting: Nurse Practitioner

## 2017-12-18 ENCOUNTER — Ambulatory Visit (INDEPENDENT_AMBULATORY_CARE_PROVIDER_SITE_OTHER): Payer: Medicare HMO

## 2017-12-18 VITALS — BP 136/84 | HR 76 | Temp 97.8°F | Ht 68.0 in | Wt 169.0 lb

## 2017-12-18 VITALS — BP 148/80 | HR 81 | Temp 97.8°F | Ht 68.0 in | Wt 169.0 lb

## 2017-12-18 DIAGNOSIS — Z0000 Encounter for general adult medical examination without abnormal findings: Secondary | ICD-10-CM

## 2017-12-18 DIAGNOSIS — G8929 Other chronic pain: Secondary | ICD-10-CM | POA: Diagnosis not present

## 2017-12-18 DIAGNOSIS — M545 Low back pain, unspecified: Secondary | ICD-10-CM

## 2017-12-18 DIAGNOSIS — Z135 Encounter for screening for eye and ear disorders: Secondary | ICD-10-CM | POA: Diagnosis not present

## 2017-12-18 DIAGNOSIS — Z23 Encounter for immunization: Secondary | ICD-10-CM

## 2017-12-18 DIAGNOSIS — I10 Essential (primary) hypertension: Secondary | ICD-10-CM

## 2017-12-18 DIAGNOSIS — E1142 Type 2 diabetes mellitus with diabetic polyneuropathy: Secondary | ICD-10-CM | POA: Diagnosis not present

## 2017-12-18 MED ORDER — ZOSTER VAC RECOMB ADJUVANTED 50 MCG/0.5ML IM SUSR: 0.5000 mL | Freq: Once | INTRAMUSCULAR | 1 refills | Status: AC

## 2017-12-18 NOTE — Patient Instructions (Addendum)
[name] , Thank you for taking time to come for your Medicare Wellness Visit. I appreciate your ongoing commitment to your health goals. Please review the following plan we discussed and let me know if I can assist you in the future.   Screening recommendations/referrals: Colonoscopy up to date, due 10/10/2021 Recommended yearly ophthalmology/optometry visit for glaucoma screening and checkup- referral sent for Dr. Birder RobsonSchapiro Recommended yearly dental visit for hygiene and checkup  Vaccinations: Influenza vaccine given today Pneumococcal vaccine up to date, completed Tdap vaccine up to date, due 01/25/2020 Shingles vaccine due, ordered to pharmacy    Advanced directives: Please bring us a copy of living will and health care power of attorney once it's completed  Conditions/risks identified: none  Next appointment: Danny RussellSara Lubna Stegeman, RN 12/24/2018 @ 11am  Preventive Care 65 Years and Older, Male Preventive care refers to lifestyle choices and visits with your health care provider that can promote health and wellness. What does preventive care include?  A yearly physical exam. This is also called an annual well check.  Dental exams once or twice a year.  Routine eye exams. Ask your health care provider how often you should have your eyes checked.  Personal lifestyle choices, including:  Daily care of your teeth and gums.  Regular physical activity.  Eating a healthy diet.  Avoiding tobacco and drug use.  Limiting alcohol use.  Practicing safe sex.  Taking low doses of aspirin every day.  Taking vitamin and mineral supplements as recommended by your health care provider. What happens during an annual well check? The services and screenings done by your health care provider during your annual well check will depend on your age, overall health, lifestyle risk factors, and family history of disease. Counseling  Your health care provider may ask you questions about  your:  Alcohol use.  Tobacco use.  Drug use.  Emotional well-being.  Home and relationship well-being.  Sexual activity.  Eating habits.  History of falls.  Memory and ability to understand (cognition).  Work and work Astronomerenvironment. Screening  You may have the following tests or measurements:  Height, weight, and BMI.  Blood pressure.  Lipid and cholesterol levels. These may be checked every 5 years, or more frequently if you are over 73 years old.  Skin check.  Lung cancer screening. You may have this screening every year starting at age 73 if you have a 30-pack-year history of smoking and currently smoke or have quit within the past 15 years.  Fecal occult blood test (FOBT) of the stool. You may have this test every year starting at age 73.  Flexible sigmoidoscopy or colonoscopy. You may have a sigmoidoscopy every 5 years or a colonoscopy every 10 years starting at age 73.  Prostate cancer screening. Recommendations will vary depending on your family history and other risks.  Hepatitis C blood test.  Hepatitis B blood test.  Sexually transmitted disease (STD) testing.  Diabetes screening. This is done by checking your blood sugar (glucose) after you have not eaten for a while (fasting). You may have this done every 1-3 years.  Abdominal aortic aneurysm (AAA) screening. You may need this if you are a current or former smoker.  Osteoporosis. You may be screened starting at age 73 if you are at high risk. Talk with your health care provider about your test results, treatment options, and if necessary, the need for more tests. Vaccines  Your health care provider may recommend certain vaccines, such as:  Influenza vaccine.  This is recommended every year.  Tetanus, diphtheria, and acellular pertussis (Tdap, Td) vaccine. You may need a Td booster every 10 years.  Zoster vaccine. You may need this after age 73.  Pneumococcal 13-valent conjugate (PCV13) vaccine.  One dose is recommended after age 73  Pneumococcal polysaccharide (PPSV23) vaccine. One dose is recommended after age 73 Talk to your health care provider about which screenings and vaccines you need and how often you need them. This information is not intended to replace advice given to you by your health care provider. Make sure you discuss any questions you have with your health care provider. Document Released: 02/06/2015 Document Revised: 09/30/2015 Document Reviewed: 11/11/2014 Elsevier Interactive Patient Education  2017 [place] Prevention in the Home Falls can cause injuries. They can happen to people of all ages. There are many things you can do to make your home safe and to help prevent falls. What can I do on the outside of my home?  Regularly fix the edges of walkways and driveways and fix any cracks.  Remove anything that might make you trip as you walk through a door, such as a raised step or threshold.  Trim any bushes or trees on the path to your home.  Use bright outdoor lighting.  Clear any walking paths of anything that might make someone trip, such as rocks or tools.  Regularly check to see if handrails are loose or broken. Make sure that both sides of any steps have handrails.  Any raised decks and porches should have guardrails on the edges.  Have any leaves, snow, or ice cleared regularly.  Use sand or salt on walking paths during winter.  Clean up any spills in your garage right away. This includes oil or grease spills. What can I do in the bathroom?  Use night lights.  Install grab bars by the toilet and in the tub and shower. Do not use towel bars as grab bars.  Use non-skid mats or decals in the tub or shower.  If you need to sit down in the shower, use a plastic, non-slip stool.  Keep the floor dry. Clean up any water that spills on the floor as soon as it happens.  Remove soap buildup in the tub or shower regularly.  Attach bath  mats securely with double-sided non-slip rug tape.  Do not have throw rugs and other things on the floor that can make you trip. What can I do in the bedroom?  Use night lights.  Make sure that you have a light by your bed that is easy to reach.  Do not use any sheets or blankets that are too big for your bed. They should not hang down onto the floor.  Have a firm chair that has side arms. You can use this for support while you get dressed.  Do not have throw rugs and other things on the floor that can make you trip. What can I do in the kitchen?  Clean up any spills right away.  Avoid walking on wet floors.  Keep items that you use a lot in easy-to-reach places.  If you need to reach something above you, use a strong step stool that has a grab bar.  Keep electrical cords out of the way.  Do not use floor polish or wax that makes floors slippery. If you must use wax, use non-skid floor wax.  Do not have throw rugs and other things on the floor that can make  you trip. What can I do with my stairs?  Do not leave any items on the stairs.  Make sure that there are handrails on both sides of the stairs and use them. Fix handrails that are broken or loose. Make sure that handrails are as long as the stairways.  Check any carpeting to make sure that it is firmly attached to the stairs. Fix any carpet that is loose or worn.  Avoid having throw rugs at the top or bottom of the stairs. If you do have throw rugs, attach them to the floor with carpet tape.  Make sure that you have a light switch at the top of the stairs and the bottom of the stairs. If you do not have them, ask someone to add them for you. What else can I do to help prevent falls?  Wear shoes that:  Do not have high heels.  Have rubber bottoms.  Are comfortable and fit you well.  Are closed at the toe. Do not wear sandals.  If you use a stepladder:  Make sure that it is fully opened. Do not climb a closed  stepladder.  Make sure that both sides of the stepladder are locked into place.  Ask someone to hold it for you, if possible.  Clearly mark and make sure that you can see:  Any grab bars or handrails.  First and last steps.  Where the edge of each step is.  Use tools that help you move around (mobility aids) if they are needed. These include:  Canes.  Walkers.  Scooters.  Crutches.  Turn on the lights when you go into a dark area. Replace any light bulbs as soon as they burn out.  Set up your furniture so you have a clear path. Avoid moving your furniture around.  If any of your floors are uneven, fix them.  If there are any pets around you, be aware of where they are.  Review your medicines with your doctor. Some medicines can make you feel dizzy. This can increase your chance of falling. Ask your doctor what other things that you can do to help prevent falls. This information is not intended to replace advice given to you by your health care provider. Make sure you discuss any questions you have with your health care provider. Document Released: 11/06/2008 Document Revised: 06/18/2015 Document Reviewed: 02/14/2014 Elsevier Interactive Patient Education  2017 [employer].

## 2017-12-18 NOTE — Patient Instructions (Addendum)
To stop mobic routinely  To use tylenol 500 mg 1-2 tablets every 8 hours as needed pain If needed to use mobic 7.5 mg (1/2 tablets) daily as needed  Try to increase activity   Can use aspercream with lidocaine to groin area due to numbness sensation

## 2017-12-18 NOTE — Progress Notes (Signed)
Subjective:   Danny Winters is a 72 y.o. male who presents for Medicare Annual/Subsequent preventive examination.  Last AWV-08/12/2016       Objective:    Vitals: BP (!) 148/80 (BP Location: Left Arm, Patient Position: Sitting)   Pulse 81   Temp 97.8 F (36.6 C) (Oral)   Ht _0  (1.727 m)   Wt 169 lb (76.7 kg)   SpO2 98%   BMI 25.70 kg/m   Body mass index is 25.7 kg/m.  Provider notified of BP  Advanced Directives 12/18/2017 10/08/2017 08/12/2016 11/27/2015 06/19/2015 03/13/2015 01/23/2015  Does Patient Have a Medical Advance Directive? _1  No No  Would patient like information on creating a medical advance directive? Yes (MAU/Ambulatory/Procedural Areas - Information given) No - Patient declined Yes (MAU/Ambulatory/Procedural Areas - Information given) No - patient declined information No - patient declined information Yes - Educational materials given Yes - Educational materials given  Pre-existing out of facility DNR order (yellow form or pink MOST form) - - - - - - -    Tobacco Social History   Tobacco Use  Smoking Status Former Smoker  . Packs/day: 1.00  . Years: 11.00  . Pack years: 11.00  . Last attempt to quit: 07/01/1973  . Years since quitting: 44.4  Smokeless Tobacco Never Used     Counseling given: Not Answered   Clinical Intake:  Pre-visit preparation completed: No  Pain : No/denies pain     Diabetes: Yes CBG done?: No Did pt. bring in CBG monitor from home?: No  How often do you need to have someone help you when you read instructions, pamphlets, or other written materials from your doctor or pharmacy?: 1 - Never What is the last grade level you completed in school?: high school  Interpreter Needed?: No  Information entered by :: Tyson Dense, RN  Past Medical History:  Diagnosis Date  . Lumbago   . Memory loss   . Other and unspecified hyperlipidemia   . Type II or unspecified type diabetes mellitus without mention of  complication, uncontrolled   . Unspecified arthropathy, shoulder region   . Unspecified essential hypertension   . Unspecified hereditary and idiopathic peripheral neuropathy   . Unspecified vitamin D deficiency    Past Surgical History:  Procedure Laterality Date  . COLONOSCOPY  2005   Dr Sharlotte Alamo Medoff  . COLONOSCOPY W/ POLYPECTOMY  2012   [facility]- sessile polyp in transverse colon 4 mm and another 6 mm  . KIDNEY STONE SURGERY  2013   Dr Kathie Rhodes   Family History  Problem Relation Age of Onset  . Heart disease Mother   . Heart attack Father   . Cancer Sister        colon  . Cancer Brother        colon  . Cancer Brother        colon  . Cancer Brother        prostate  . Cancer Brother        prostate  . Cancer Brother        Pancreas  . Stroke Other    Social History   Socioeconomic History  . Marital status: Divorced    Spouse name: Not on file  . Number of children: Not on file  . Years of education: Not on file  . Highest education level: Not on file  Occupational History  . Not on file  Social Needs  . 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 Food insecurity:    Worry: Never true    Inability: Never true  . Transportation needs:    Medical: No    Non-medical: No  Tobacco Use  . Smoking status: Former Smoker    Packs/day: 1.00    Years: 11.00    Pack years: 11.00    Last attempt to quit: 07/01/1973    Years since quitting: 44.4  . Smokeless tobacco: Never Used  Substance and Sexual Activity  . Alcohol use: No  . Drug use: No  . Sexual activity: Not on file  Lifestyle  . Physical activity:    Days per week: 0 days    Minutes per session: 0 min  . Stress: Only a little  Relationships  . Social connections:    Talks on phone: More than three times a week    Gets together: More than three times a week    Attends religious service: More than 4 times per year    Active member of club or organization: Yes    Attends meetings of  clubs or organizations: More than 4 times per year    Relationship status: Divorced  Other Topics Concern  . Not on file  Social History Narrative  . Not on file    Outpatient Encounter Medications as of 12/18/2017  Medication Sig  . Alcohol Swabs (B-D SINGLE USE SWABS REGULAR) PADS Use daily as directed when checking blood sugar  E11.42  . aspirin 81 MG tablet Take 81 mg by mouth daily.  . Blood Glucose Monitoring Suppl (ACCU-CHEK AVIVA PLUS) w/Device KIT Use to test blood sugar twice daily. Dx: E11.42  . gabapentin (NEURONTIN) 100 MG capsule Take 1 capsule (100 mg total) by mouth at bedtime.  . irbesartan (AVAPRO) 75 MG tablet TAKE ONE TABLET BY MOUTH ONCE DAILY  . meloxicam (MOBIC) 15 MG tablet TAKE 1 TABLET EVERY DAY  FOR  JOINT  PAIN AND BACK PAIN  . metFORMIN (GLUCOPHAGE) 1000 MG tablet TAKE 1 TABLET TWICE DAILY FOR BLOOD SUGAR  . simvastatin (ZOCOR) 10 MG tablet TAKE 1 TABLET ONE TIME DAILY FOR CHOLESTEROL  . Zoster Vaccine Adjuvanted [facility]) injection Inject 0.5 mLs into the muscle once for 1 dose.  . [DISCONTINUED] Zoster Vaccine Adjuvanted [facility]) injection Inject 0.5 mLs into the muscle once.  . calcium-vitamin D (OSCAL 500/200 D-3) 500-200 MG-UNIT per tablet Take 1 tablet by mouth 2 (two) times daily. (Patient not taking: Reported on 10/08/2017)   No facility-administered encounter medications on file as of 12/18/2017.     Activities of Daily Living In your present state of health, do you have any difficulty performing the following activities: 12/18/2017  Hearing? N  Vision? N  Difficulty concentrating or making decisions? N  Walking or climbing stairs? N  Dressing or bathing? N  Doing errands, shopping? N  Preparing Food and eating ? N  Using the Toilet? N  In the past six months, have you accidently leaked urine? N  Do you have problems with loss of bowel control? N  Managing your Medications? N  Managing your Finances? N  Housekeeping or managing your  Housekeeping? N  Some recent data might be hidden    Patient Care Team: Gildardo Cranker, DO as PCP - General (Internal Medicine)   Assessment:   This is a routine wellness examination for [place].  Exercise Activities and Dietary recommendations Current Exercise Habits: The patient does not participate in regular exercise at present, Exercise limited by:  None identified  Goals    . Exercise 2x per week (30 min per time)     Pt will look in to silver sneakers and workout 2 times a week.       Fall Risk Fall Risk  12/18/2017 04/18/2017 08/12/2016 11/27/2015 06/19/2015  Falls in the past year? 0 Yes No No No  Comment - - - - -  Number falls in past yr: 0 1 - - -  Comment - SLIPPED IN RAIN  - - -  Injury with Fall? 0 No - - -   Is the patient's home free of loose throw rugs in walkways, pet beds, electrical cords, etc?   yes      Grab bars in the bathroom? no      Handrails on the stairs?   yes      Adequate lighting?   yes  Depression Screen PHQ 2/9 Scores 12/18/2017 08/12/2016 06/19/2015 11/20/2013  PHQ - 2 Score 0 0 0 0    Cognitive Function MMSE - Mini Mental State Exam 12/18/2017 08/12/2016 07/17/2013  Orientation to time _0 Orientation to Place _1 Registration _2 Attention/ Calculation _3 Recall 2 0 1  Language- name 2 objects _4 Language- repeat _5 Language- follow 3 step command _6 Language- read & follow direction _7 Write a sentence _8 Copy design _9 Total score _10 Immunization History  Administered Date(s) Administered  . Influenza, High Dose Seasonal PF 12/18/2017  . Influenza,inj,Quad PF,6+ Mos 11/13/2012, 11/20/2013, 01/23/2015, 11/27/2015, 04/18/2017  . Influenza-Unspecified 11/10/2011  . Pneumococcal Conjugate-13 04/08/2014  . Pneumococcal Polysaccharide-23 10/01/2008, 11/13/2012  . Tdap 01/24/2010    Qualifies for Shingles Vaccine? Yes, educated and ordered to pharmacy  Screening Tests Health  Maintenance  Topic Date Due  . OPHTHALMOLOGY EXAM  07/18/2014  . HEMOGLOBIN A1C  10/19/2017  . Hepatitis C Screening  04/19/2018 (Originally 1945-03-05)  . FOOT EXAM  04/19/2018  . TETANUS/TDAP  01/25/2020  . COLONOSCOPY  12/13/2020  . INFLUENZA VACCINE  Completed  . PNA vac Low Risk Adult  Completed   Cancer Screenings: Lung: Low Dose CT Chest recommended if Age 27-80 years, 30 pack-year currently smoking OR have quit w/in 15years. Patient does not qualify. Colorectal: up to date  Additional Screenings:  Hepatitis C Screening:declined Flu vaccine due: high dose given Diabetic eye exam referral sent      Plan:    I have personally reviewed and addressed the Medicare Annual Wellness questionnaire and have noted the following in the patient's chart:  A. Medical and social history B. Use of alcohol, tobacco or illicit drugs  C. Current medications and supplements D. Functional ability and status E.  Nutritional status F.  Physical activity G. Advance directives H. List of other physicians I.  Hospitalizations, surgeries, and ER visits in previous 12 months J.  [place] to include hearing, vision, cognitive, depression L. Referrals and appointments - none  In addition, I have reviewed and discussed with patient certain preventive protocols, quality metrics, and best practice recommendations. A written personalized care plan for preventive services as well as general preventive health recommendations were provided to patient.  See attached scanned questionnaire for additional information.   Signed,   Tyson Dense, RN Nurse Health Advisor  Patient Concerns: none

## 2017-12-18 NOTE — Progress Notes (Signed)
Provider: Gildardo Cranker, DO  Patient Care Team: Gildardo Cranker, DO as PCP - General (Internal Medicine)  Extended Emergency Contact Information Primary Emergency Contact: Quinby,Catherine Address: [address], [place] Phone: [phone] Relation: Spouse Secondary Emergency Contact: Farris Has [place] of [place] Phone: [phone] Relation: Daughter Allergies  Allergen Reactions  . Lisinopril Itching   Code Status: FULL Goals of Care: Advanced Directive information Advanced Directives 12/18/2017  Does Patient Have a Medical Advance Directive? No  Would patient like information on creating a medical advance directive? Yes (MAU/Ambulatory/Procedural Areas - Information given)  Pre-existing out of facility DNR order (yellow form or pink MOST form) -     Chief Complaint  Patient presents with  . Medical Management of Chronic Issues    Pt is being seen for a physical.     HPI: Patient is a 72 y.o. male seen in today for an annual wellness exam.   Major illnesses or hospitalization in the last year- had hernia operation in September. Doing well post op but still having soreness planning to follow up with surgeon due to this.   Depression screen [facility] 2/9 12/18/2017 12/18/2017 08/12/2016 06/19/2015 11/20/2013  Decreased Interest 0 0 0 0 0  Down, Depressed, Hopeless 0 0 0 0 0  PHQ - 2 Score 0 0 0 0 0    Fall Risk  12/18/2017 12/18/2017 04/18/2017 08/12/2016 11/27/2015  Falls in the past year? 0 0 Yes No No  Comment - - - - -  Number falls in past yr: - 0 1 - -  Comment - - SLIPPED IN RAIN  - -  Injury with Fall? - 0 No - -   MMSE - Mini Mental State Exam 12/18/2017 08/12/2016 07/17/2013  Orientation to time _0 Orientation to Place _1 Registration _2 Attention/ Calculation _3 Recall 2 0 1  Language- name 2 objects _4 Language- repeat _5 Language- follow 3 step command _6 Language-  read & follow direction _7 Write a sentence _8 Copy design _9 Total score _10 Health Maintenance  Topic Date Due  . OPHTHALMOLOGY EXAM  07/18/2014  . HEMOGLOBIN A1C  10/19/2017  . Hepatitis C Screening  04/19/2018 (Originally [birth year])  . FOOT EXAM  04/19/2018  . TETANUS/TDAP  01/25/2020  . COLONOSCOPY  12/13/2020  . INFLUENZA VACCINE  Completed  . PNA vac Low Risk Adult  Completed    Ophthalmology- referral sent   Dentition: full set of dentures   Fasting glucose this morning was 88. Did not take medication because he did not know when he would eat.    Past Medical History:  Diagnosis Date  . Lumbago   . Memory loss   . Other and unspecified hyperlipidemia   . Type II or unspecified type diabetes mellitus without mention of complication, uncontrolled   . Unspecified arthropathy, shoulder region   . Unspecified essential hypertension   . Unspecified hereditary and idiopathic peripheral neuropathy   . Unspecified vitamin D deficiency     Past Surgical History:  Procedure Laterality Date  . COLONOSCOPY  2005   Dr Sharlotte Alamo Medoff  . COLONOSCOPY W/ POLYPECTOMY  2012   [facility]- sessile polyp in transverse colon 4 mm and another  6 mm  . KIDNEY STONE SURGERY  2013   Dr Kathie Rhodes    Social History   Socioeconomic History  . Marital status: Divorced    Spouse name: Not on file  . Number of children: Not on file  . Years of education: Not on file  . Highest education level: Not on file  Occupational History  . Not on file  Social Needs  . Financial resource strain: Not hard at all  . Food insecurity:    Worry: Never true    Inability: Never true  . Transportation needs:    Medical: No    Non-medical: No  Tobacco Use  . Smoking status: Former Smoker    Packs/day: 1.00    Years: 11.00    Pack years: 11.00    Last attempt to quit: 07/01/1973    Years since quitting: 44.4  . Smokeless tobacco: Never Used  Substance and Sexual  Activity  . Alcohol use: No  . Drug use: No  . Sexual activity: Not on file  Lifestyle  . Physical activity:    Days per week: 0 days    Minutes per session: 0 min  . Stress: Only a little  Relationships  . Social connections:    Talks on phone: More than three times a week    Gets together: More than three times a week    Attends religious service: More than 4 times per year    Active member of club or organization: Yes    Attends meetings of clubs or organizations: More than 4 times per year    Relationship status: Divorced  Other Topics Concern  . Not on file  Social History Narrative  . Not on file    Family History  Problem Relation Age of Onset  . Heart disease Mother   . Heart attack Father   . Cancer Sister        colon  . Cancer Brother        colon  . Cancer Brother        colon  . Cancer Brother        prostate  . Cancer Brother        prostate  . Cancer Brother        Pancreas  . Stroke Other     Review of Systems:  Review of Systems  Constitutional: Negative for activity change, appetite change, fatigue and unexpected weight change.  HENT: Negative for congestion and hearing loss.   Eyes: Negative.   Respiratory: Negative for cough and shortness of breath.   Cardiovascular: Negative for chest pain, palpitations and leg swelling.  Gastrointestinal: Negative for abdominal pain, constipation and diarrhea.  Genitourinary: Negative for difficulty urinating and dysuria.  Musculoskeletal: Negative for arthralgias, back pain and myalgias.  Skin: Negative for color change and wound.       Numbness feeling to skin around groin to left below hernia repair   Neurological: Negative for dizziness and weakness.  Psychiatric/Behavioral: Negative for agitation, behavioral problems and confusion.     Allergies as of 12/18/2017      Reactions   Lisinopril Itching      Medication List        Accurate as of 12/18/17  1:42 PM. Always use your most recent med  list.          ACCU-CHEK AVIVA PLUS w/Device Kit Use to test blood sugar twice daily. Dx: E11.42   aspirin 81 MG tablet Take 81 mg  by mouth daily.   B-D SINGLE USE SWABS REGULAR Pads Use daily as directed when checking blood sugar  E11.42   calcium-vitamin D 500-200 MG-UNIT tablet Commonly known as:  OSCAL WITH D Take 1 tablet by mouth 2 (two) times daily.   gabapentin 100 MG capsule Commonly known as:  NEURONTIN Take 1 capsule (100 mg total) by mouth at bedtime.   irbesartan 75 MG tablet Commonly known as:  AVAPRO TAKE ONE TABLET BY MOUTH ONCE DAILY   meloxicam 15 MG tablet Commonly known as:  MOBIC TAKE 1 TABLET EVERY DAY  FOR  JOINT  PAIN AND BACK PAIN   metFORMIN 1000 MG tablet Commonly known as:  GLUCOPHAGE TAKE 1 TABLET TWICE DAILY FOR BLOOD SUGAR   simvastatin 10 MG tablet Commonly known as:  ZOCOR TAKE 1 TABLET ONE TIME DAILY FOR CHOLESTEROL   Zoster Vaccine Adjuvanted injection Commonly known as:  SHINGRIX Inject 0.5 mLs into the muscle once for 1 dose.         Physical Exam: Vitals:   12/18/17 1336  BP: 136/84  Pulse: 76  Temp: 97.8 F (36.6 C)  TempSrc: Oral  SpO2: 98%  Weight: 169 lb (76.7 kg)  Height: _0  (1.727 m)   Body mass index is 25.7 kg/m. Physical Exam  Constitutional: He is oriented to person, place, and time. He appears well-developed and well-nourished.  HENT:  Mouth/Throat: Oropharynx is clear and moist.  MMM; no oral thrush  Eyes: Pupils are equal, round, and reactive to light. No scleral icterus.  Neck: Neck supple. Carotid bruit is not present. No thyromegaly present.  Cardiovascular: Normal rate, regular rhythm and intact distal pulses. Exam reveals no gallop and no friction rub.  Murmur (1/6 SEM) heard. Pulses:      Dorsalis pedis pulses are 2+ on the right side.       Posterior tibial pulses are 2+ on the right side.  No distal LE edema. No calf TTP  Pulmonary/Chest: Effort normal and breath sounds normal. He  has no wheezes. He has no rales. He exhibits no tenderness.  Abdominal: Soft. Normal appearance and bowel sounds are normal. He exhibits no distension, no abdominal bruit, no pulsatile midline mass and no mass. There is no hepatomegaly. There is no tenderness. There is no rigidity, no rebound and no guarding. No hernia.  Musculoskeletal: Normal range of motion. He exhibits no edema or deformity.  Feet:  Right Foot:  Skin Integrity: Positive for dry skin.  Left Foot:  Skin Integrity: Positive for dry skin.  Lymphadenopathy:    He has no cervical adenopathy.  Neurological: He is alert and oriented to person, place, and time. He has normal reflexes.  Skin: Skin is warm and dry. No rash noted.  Psychiatric: He has a normal mood and affect. His behavior is normal. Judgment and thought content normal.    Labs reviewed: Basic Metabolic Panel: Recent Labs    04/18/17 1231 10/08/17 2000  NA 136 139  K 4.1 3.8  CL 101 103  CO2 27 25  GLUCOSE 122 169*  BUN 17 24*  CREATININE 1.16 1.18  CALCIUM 9.5 9.6  TSH 1.89  --    Liver Function Tests: Recent Labs    04/18/17 1231 10/08/17 2000  AST 19 26  ALT 10 16  ALKPHOS  --  49  BILITOT 1.1 1.4*  PROT 7.2 7.9  ALBUMIN  --  4.3   Recent Labs    10/08/17 2000  LIPASE 41  No results for input(s): AMMONIA in the last 8760 hours. CBC: Recent Labs    04/18/17 1231 10/08/17 2000  WBC 4.1 8.8  NEUTROABS 2,157  --   HGB 15.5 14.6  HCT 45.0 42.7  MCV 83.5 86.6  PLT 188 178   Lipid Panel: Recent Labs    04/18/17 1231  CHOL 120  HDL 41  LDLCALC 65  TRIG 61  CHOLHDL 2.9   Lab Results  Component Value Date   HGBA1C 7.4 (H) 04/18/2017    Procedures: No results found.  Assessment/Plan 1. Essential hypertension Stable continue current regimen - EKG 12-Lead-  Changes noted in R wave, question if lead sticker were clearly adhere, SR rate 89  2. DM type 2 with diabetic peripheral neuropathy ([place]) Encouraged dietary  compliance, routine foot care/monitoring and to keep up with diabetic eye exams through ophthalmology  -will continue current medication - Hemoglobin A1c - COMPLETE METABOLIC PANEL WITH GFR  3. Preventative health care Doing well, recommended aspercream with lidocaine to area below incision, skin is normal without rash. The patient was counseled regarding the appropriate use of alcohol, regular self-examination of the breasts on a monthly basis, prevention of dental and periodontal disease, diet, regular sustained exercise for at least 30 minutes 5 times per week, testicular self-examination on a monthly basis,smoking cessation, tobacco use,  and recommended schedule for GI hemoccult testing, colonoscopy, cholesterol, thyroid and diabetes screening. -due to age To stop mobic routinely To use tylenol 500 mg 1-2 tablets every 8 hours as needed pain If needed to use mobic 7.5 mg (1/2 tablets) daily as needed  Next appt: 6 momths, will get labs at the time of visit.  Carlos American. [place], [facility] [phone]

## 2017-12-19 LAB — COMPLETE METABOLIC PANEL WITH GFR
AG Ratio: 1.8 (calc) (ref 1.0–2.5)
ALT: 10 U/L (ref 9–46)
AST: 17 U/L (ref 10–35)
Albumin: 4.2 g/dL (ref 3.6–5.1)
Alkaline phosphatase (APISO): 50 U/L (ref 40–115)
BILIRUBIN TOTAL: 1 mg/dL (ref 0.2–1.2)
BUN: 18 mg/dL (ref 7–25)
CALCIUM: 9.5 mg/dL (ref 8.6–10.3)
CHLORIDE: 100 mmol/L (ref 98–110)
CO2: 27 mmol/L (ref 20–32)
Creat: 1.09 mg/dL (ref 0.70–1.18)
GFR, EST AFRICAN AMERICAN: 78 mL/min/{1.73_m2} (ref >=60)
GFR, Est Non African American: 67 mL/min/{1.73_m2} (ref >=60)
GLUCOSE: 197 mg/dL — AB (ref 65–139)
Globulin: 2.4 g/dL (calc) (ref 1.9–3.7)
Potassium: 3.7 mmol/L (ref 3.5–5.3)
Sodium: 136 mmol/L (ref 135–146)
TOTAL PROTEIN: 6.6 g/dL (ref 6.1–8.1)

## 2017-12-19 LAB — HEMOGLOBIN A1C
Hgb A1c MFr Bld: 7.2 % of total Hgb — ABNORMAL HIGH (ref <5.7)
Mean Plasma Glucose: 160 (calc)
eAG (mmol/L): 8.9 (calc)

## 2017-12-19 MED ORDER — MELOXICAM 7.5 MG PO TABS: 7.5000 mg | ORAL_TABLET | Freq: Every day | ORAL | Status: DC | PRN

## 2017-12-26 ENCOUNTER — Other Ambulatory Visit: Payer: Self-pay | Admitting: *Deleted

## 2017-12-26 MED ORDER — ACCU-CHEK SOFTCLIX LANCETS MISC: 1 refills | Status: DC

## 2017-12-26 MED ORDER — GLUCOSE BLOOD VI STRP: ORAL_STRIP | 1 refills | Status: DC

## 2017-12-26 NOTE — Telephone Encounter (Signed)
[facility] 

## 2017-12-29 ENCOUNTER — Other Ambulatory Visit: Payer: Self-pay

## 2017-12-29 DIAGNOSIS — E1142 Type 2 diabetes mellitus with diabetic polyneuropathy: Secondary | ICD-10-CM

## 2017-12-29 MED ORDER — IRBESARTAN 75 MG PO TABS: 75.0000 mg | ORAL_TABLET | Freq: Every day | ORAL | 0 refills | Status: DC

## 2017-12-29 NOTE — Telephone Encounter (Signed)
Patient states he was told by Francine Gravenhumana it would take  7-10 business days to receive his medication, Patient requested Irbesartan be sent to his local [facility] pharmacy because he is completely out. Medication has been sent to the local pharmacy as requested.

## 2018-01-25 DIAGNOSIS — H524 Presbyopia: Secondary | ICD-10-CM | POA: Diagnosis not present

## 2018-01-25 DIAGNOSIS — E119 Type 2 diabetes mellitus without complications: Secondary | ICD-10-CM | POA: Diagnosis not present

## 2018-01-25 DIAGNOSIS — Z7984 Long term (current) use of oral hypoglycemic drugs: Secondary | ICD-10-CM | POA: Diagnosis not present

## 2018-01-25 DIAGNOSIS — H2513 Age-related nuclear cataract, bilateral: Secondary | ICD-10-CM | POA: Diagnosis not present

## 2018-01-25 DIAGNOSIS — H5203 Hypermetropia, bilateral: Secondary | ICD-10-CM | POA: Diagnosis not present

## 2018-01-25 DIAGNOSIS — H25013 Cortical age-related cataract, bilateral: Secondary | ICD-10-CM | POA: Diagnosis not present

## 2018-01-25 LAB — HM DIABETES EYE EXAM

## 2018-01-26 ENCOUNTER — Encounter: Payer: Self-pay | Admitting: Nurse Practitioner

## 2018-03-01 ENCOUNTER — Ambulatory Visit (INDEPENDENT_AMBULATORY_CARE_PROVIDER_SITE_OTHER): Payer: Medicare HMO | Admitting: Family

## 2018-03-01 ENCOUNTER — Encounter: Payer: Self-pay | Admitting: Family

## 2018-03-01 VITALS — BP 118/78 | HR 93 | Temp 98.1°F | Resp 18 | Ht 64.5 in | Wt 174.4 lb

## 2018-03-01 DIAGNOSIS — K219 Gastro-esophageal reflux disease without esophagitis: Secondary | ICD-10-CM | POA: Diagnosis not present

## 2018-03-01 DIAGNOSIS — H6121 Impacted cerumen, right ear: Secondary | ICD-10-CM

## 2018-03-01 MED ORDER — CARBAMIDE PEROXIDE 6.5 % OT SOLN: 5.0000 [drp] | Freq: Two times a day (BID) | OTIC | 0 refills | Status: AC

## 2018-03-01 MED ORDER — OMEPRAZOLE 20 MG PO CPDR: 20.0000 mg | DELAYED_RELEASE_CAPSULE | Freq: Every day | ORAL | 0 refills | Status: DC

## 2018-03-01 NOTE — Progress Notes (Signed)
Provider: Dinah Ngetich FNP-C  Lauree Chandler, NP  Patient Care Team: Lauree Chandler, NP as PCP - General (Geriatric Medicine)  Extended Emergency Contact Information Primary Emergency Contact: Mikowski,Catherine Address: [place], [place] Phone: [phone] Relation: Spouse Secondary Emergency Contact: Farris Has [place] of [place] Phone: [phone] Relation: Daughter  Goals of care: Advanced Directive information Advanced Directives 12/18/2017  Does Patient Have a Medical Advance Directive? No  Would patient like information on creating a medical advance directive? Yes (MAU/Ambulatory/Procedural Areas - Information given)  Pre-existing out of facility DNR order (yellow form or pink MOST form) -     Chief Complaint  Patient presents with  . Acute Visit    Patient c/o of being nauseous with cough this morning . Patient c/o of numbness in right arm going on for over a year, states it gets worse at night.     HPI:  Pt is a 73 y.o. male seen today for an acute visit for evaluation of cough and vomiting x 1 episode.He states eat some barbecue spices food previous day.He describes emesis as yellow and very bitter. He denies any abdominal pain or chest pain.Also states continues to have numbness and tingling of right hand fingers worse at night.Numbness and tingling began one year ago after falling on his elbow.he is currently on Gabapentin 100 mg capsule at bedtime.He states symptoms have not worsen than usual.     Past Medical History:  Diagnosis Date  . Lumbago   . Memory loss   . Other and unspecified hyperlipidemia   . Type II or unspecified type diabetes mellitus without mention of complication, uncontrolled   . Unspecified arthropathy, shoulder region   . Unspecified essential hypertension   . Unspecified hereditary and idiopathic peripheral neuropathy   . Unspecified vitamin D deficiency     Past Surgical History:  Procedure Laterality Date  . COLONOSCOPY  2005   Dr Sharlotte Alamo Medoff  . COLONOSCOPY W/ POLYPECTOMY  2012   [facility]- sessile polyp in transverse colon 4 mm and another 6 mm  . KIDNEY STONE SURGERY  2013   Dr Kathie Rhodes    Allergies  Allergen Reactions  . Lisinopril Itching    Outpatient Encounter Medications as of 03/01/2018  Medication Sig  . ACCU-CHEK SOFTCLIX LANCETS lancets Use to test blood sugar twice daily. Dx: E11.42  . Alcohol Swabs (B-D SINGLE USE SWABS REGULAR) PADS Use daily as directed when checking blood sugar  E11.42  . aspirin 81 MG tablet Take 81 mg by mouth daily.  . Blood Glucose Monitoring Suppl (ACCU-CHEK AVIVA PLUS) w/Device KIT Use to test blood sugar twice daily. Dx: E11.42  . calcium-vitamin D (OSCAL 500/200 D-3) 500-200 MG-UNIT per tablet Take 1 tablet by mouth 2 (two) times daily.  Marland Kitchen gabapentin (NEURONTIN) 100 MG capsule Take 1 capsule (100 mg total) by mouth at bedtime.  Marland Kitchen glucose blood (ACCU-CHEK AVIVA PLUS) test strip Use to check blood sugar twice daily. Dx: E11.42  . irbesartan (AVAPRO) 75 MG tablet Take 1 tablet (75 mg total) by mouth daily. Future refills should go to mail order  . meloxicam (MOBIC) 7.5 MG tablet Take 1 tablet (7.5 mg total) by mouth daily as needed for pain. TAKE 1 TABLET EVERY DAY  FOR  JOINT  PAIN AND BACK PAIN  . metFORMIN (GLUCOPHAGE) 1000 MG tablet TAKE 1 TABLET TWICE DAILY FOR BLOOD SUGAR  .  simvastatin (ZOCOR) 10 MG tablet TAKE 1 TABLET ONE TIME DAILY FOR CHOLESTEROL   No facility-administered encounter medications on file as of 03/01/2018.     Review of Systems  Constitutional: Negative for appetite change, chills, fatigue, fever and unexpected weight change.  HENT: Negative for congestion, rhinorrhea, sinus pressure, sinus pain, sneezing and sore throat.   Respiratory: Negative for cough, chest tightness, shortness of breath and wheezing.   Cardiovascular: Negative for chest pain,  palpitations and leg swelling.  Gastrointestinal: Negative for abdominal distention, abdominal pain, constipation and diarrhea.       X 1 episode of vomiting yellow bitter emesis   Genitourinary: Negative for dysuria, flank pain, frequency and urgency.  Skin: Negative for color change, pallor and rash.  Neurological: Negative for dizziness, weakness, light-headedness, numbness and headaches.    Immunization History  Administered Date(s) Administered  . Influenza, High Dose Seasonal PF 12/18/2017  . Influenza,inj,Quad PF,6+ Mos 11/13/2012, 11/20/2013, 01/23/2015, 11/27/2015, 04/18/2017  . Influenza-Unspecified 11/10/2011  . Pneumococcal Conjugate-13 04/08/2014  . Pneumococcal Polysaccharide-23 10/01/2008, 11/13/2012  . Tdap 01/24/2010   Pertinent  Health Maintenance Due  Topic Date Due  . HEMOGLOBIN A1C  06/18/2018  . FOOT EXAM  12/19/2018  . OPHTHALMOLOGY EXAM  01/26/2019  . COLONOSCOPY  12/13/2020  . INFLUENZA VACCINE  Completed  . PNA vac Low Risk Adult  Completed   Fall Risk  03/01/2018 12/18/2017 12/18/2017 04/18/2017 08/12/2016  Falls in the past year? 0 0 0 Yes No  Comment - - - - -  Number falls in past yr: 0 - 0 1 -  Comment - - - SLIPPED IN RAIN  -  Injury with Fall? 0 - 0 No -   Functional Status Survey:    Vitals:   03/01/18 1316  BP: 118/78  Pulse: 93  Resp: 18  Temp: 98.1 F (36.7 C)  TempSrc: Oral  SpO2: 97%  Weight: 174 lb 6.4 oz (79.1 kg)  Height: 5' 4.5" (1.638 m)   Body mass index is 29.47 kg/m. Physical Exam Vitals signs reviewed.  Constitutional:      General: He is not in acute distress. HENT:     Head: Normocephalic.     Right Ear: There is impacted cerumen.     Left Ear: Tympanic membrane, ear canal and external ear normal. There is no impacted cerumen.     Ears:     Comments: Right ear TM not visualized due to cerumen impaction     Nose: Nose normal. No congestion or rhinorrhea.     Mouth/Throat:     Mouth: Mucous membranes are moist.       Pharynx: Oropharynx is clear. No oropharyngeal exudate or posterior oropharyngeal erythema.  Eyes:     General: No scleral icterus.       Right eye: No discharge.        Left eye: No discharge.     Conjunctiva/sclera: Conjunctivae normal.     Pupils: Pupils are equal, round, and reactive to light.  Neck:     Musculoskeletal: Normal range of motion. No neck rigidity or muscular tenderness.  Cardiovascular:     Rate and Rhythm: Normal rate and regular rhythm.     Pulses: Normal pulses.     Heart sounds: Normal heart sounds. No murmur. No friction rub. No gallop.   Pulmonary:     Effort: Pulmonary effort is normal. No respiratory distress.     Breath sounds: Normal breath sounds. No wheezing, rhonchi or rales.  Chest:  Chest wall: No tenderness.  Abdominal:     General: Bowel sounds are normal. There is no distension.     Palpations: Abdomen is soft. There is no mass.     Tenderness: There is no abdominal tenderness. There is no right CVA tenderness, left CVA tenderness, guarding or rebound.  Musculoskeletal: Normal range of motion.        General: No swelling or tenderness.     Right lower leg: No edema.     Left lower leg: No edema.  Lymphadenopathy:     Cervical: No cervical adenopathy.  Skin:    General: Skin is warm and dry.     Coloration: Skin is not pale.     Findings: No erythema or rash.  Neurological:     Mental Status: He is alert and oriented to person, place, and time.     Cranial Nerves: No cranial nerve deficit.     Motor: No weakness.     Coordination: Coordination normal.  Psychiatric:        Mood and Affect: Mood normal.        Behavior: Behavior normal.        Thought Content: Thought content normal.        Judgment: Judgment normal.    Labs reviewed: Recent Labs    04/18/17 1231 10/08/17 2000 12/18/17 1429  NA 136 139 136  K 4.1 3.8 3.7  CL 101 103 100  CO2 _0 GLUCOSE 122 169* 197*  BUN 17 24* 18  CREATININE 1.16 1.18 1.09   CALCIUM 9.5 9.6 9.5   Recent Labs    04/18/17 1231 10/08/17 2000 12/18/17 1429  AST _1 ALT _2 ALKPHOS  --  49  --   BILITOT 1.1 1.4* 1.0  PROT 7.2 7.9 6.6  ALBUMIN  --  4.3  --    Recent Labs    04/18/17 1231 10/08/17 2000  WBC 4.1 8.8  NEUTROABS 2,157  --   HGB 15.5 14.6  HCT 45.0 42.7  MCV 83.5 86.6  PLT 188 178   Lab Results  Component Value Date   TSH 1.89 04/18/2017   Lab Results  Component Value Date   HGBA1C 7.2 (H) 12/18/2017   Lab Results  Component Value Date   CHOL 120 04/18/2017   HDL 41 04/18/2017   LDLCALC 65 04/18/2017   TRIG 61 04/18/2017   CHOLHDL 2.9 04/18/2017    Significant Diagnostic Results in last 30 days:  No results found.  Assessment/Plan 1. Gastroesophageal reflux disease without esophagitis Exam findings negative.suspect yellow bitter emesis due to spices food that he had.currenlty on Mobic and ASA which could also contribute to symptoms. - Start on Omeprazole 20 mg capsule one by mouth daily x 8 weeks then change to as needed.  - encouraged to avoid spicy foods.patient education provided.   2. Impacted cerumen of right ear Right ear TM not visualized due to cerumen impaction.Debrox 6.5 % otic solution instill 5 drops into right ear twice daily x 4 days. Follow up in 5 days for right ear lavage   Family/ staff Communication: Reviewed plan of care with patient.  Labs/tests ordered: None  Follow up in 5 days for right ear lavage.   Sandrea Hughs, NP

## 2018-03-01 NOTE — Patient Instructions (Addendum)
1. Take omeprazole one capsule by mouth daily x 8 weeks then take as needed. 2. Debrox 6.5 % otic solution instill 5 drops into right ear twice daily x 4 days.   3. Follow up in 5 days for right ear lavage    Gastroesophageal Reflux Disease, Adult Gastroesophageal reflux (GER) happens when acid from the stomach flows up into the tube that connects the mouth and the stomach (esophagus). Normally, food travels down the esophagus and stays in the stomach to be digested. With GER, food and stomach acid sometimes move back up into the esophagus. You may have a disease called gastroesophageal reflux disease (GERD) if the reflux:  Happens often.  Causes frequent or very bad symptoms.  Causes problems such as damage to the esophagus. When this happens, the esophagus becomes sore and swollen (inflamed). Over time, GERD can make small holes (ulcers) in the lining of the esophagus. What are the causes? This condition is caused by a problem with the muscle between the esophagus and the stomach. When this muscle is weak or not normal, it does not close properly to keep food and acid from coming back up from the stomach. The muscle can be weak because of:  Tobacco use.  Pregnancy.  Having a certain type of hernia (hiatal hernia).  Alcohol use.  Certain foods and drinks, such as coffee, chocolate, onions, and peppermint. What increases the risk? You are more likely to develop this condition if you:  Are overweight.  Have a disease that affects your connective tissue.  Use NSAID medicines. What are the signs or symptoms? Symptoms of this condition include:  Heartburn.  Difficult or painful swallowing.  The feeling of having a lump in the throat.  A bitter taste in the mouth.  Bad breath.  Having a lot of saliva.  Having an upset or bloated stomach.  Belching.  Chest pain. Different conditions can cause chest pain. Make sure you see your doctor if you have chest  pain.  Shortness of breath or noisy breathing (wheezing).  Ongoing (chronic) cough or a cough at night.  Wearing away of the surface of teeth (tooth enamel).  Weight loss. How is this treated? Treatment will depend on how bad your symptoms are. Your doctor may suggest:  Changes to your diet.  Medicine.  Surgery. Follow these instructions at home: Eating and drinking   Follow a diet as told by your doctor. You may need to avoid foods and drinks such as: ? Coffee and tea (with or without caffeine). ? Drinks that contain alcohol. ? Energy drinks and sports drinks. ? Bubbly (carbonated) drinks or sodas. ? Chocolate and cocoa. ? Peppermint and mint flavorings. ? Garlic and onions. ? Horseradish. ? Spicy and acidic foods. These include peppers, chili powder, curry powder, vinegar, hot sauces, and BBQ sauce. ? Citrus fruit juices and citrus fruits, such as oranges, lemons, and limes. ? Tomato-based foods. These include red sauce, chili, salsa, and pizza with red sauce. ? Fried and fatty foods. These include donuts, french fries, potato chips, and high-fat dressings. ? High-fat meats. These include hot dogs, rib eye steak, sausage, ham, and bacon. ? High-fat dairy items, such as whole milk, butter, and cream cheese.  Eat small meals often. Avoid eating large meals.  Avoid drinking large amounts of liquid with your meals.  Avoid eating meals during the 2-3 hours before bedtime.  Avoid lying down right after you eat.  Do not exercise right after you eat. Lifestyle   Do  not use any products that contain nicotine or tobacco. These include cigarettes, e-cigarettes, and chewing tobacco. If you need help quitting, ask your doctor.  Try to lower your stress. If you need help doing this, ask your doctor.  If you are overweight, lose an amount of weight that is healthy for you. Ask your doctor about a safe weight loss goal. General instructions  Pay attention to any changes in  your symptoms.  Take over-the-counter and prescription medicines only as told by your doctor. Do not take aspirin, ibuprofen, or other NSAIDs unless your doctor says it is okay.  Wear loose clothes. Do not wear anything tight around your waist.  Raise (elevate) the head of your bed about 6 inches (15 cm).  Avoid bending over if this makes your symptoms worse.  Keep all follow-up visits as told by your doctor. This is important. Contact a doctor if:  You have new symptoms.  You lose weight and you do not know why.  You have trouble swallowing or it hurts to swallow.  You have wheezing or a cough that keeps happening.  Your symptoms do not get better with treatment.  You have a hoarse voice. Get help right away if:  You have pain in your arms, neck, jaw, teeth, or back.  You feel sweaty, dizzy, or light-headed.  You have chest pain or shortness of breath.  You throw up (vomit) and your throw-up looks like blood or coffee grounds.  You pass out (faint).  Your poop (stool) is bloody or black.  You cannot swallow, drink, or eat. Summary  If a person has gastroesophageal reflux disease (GERD), food and stomach acid move back up into the esophagus and cause symptoms or problems such as damage to the esophagus.  Treatment will depend on how bad your symptoms are.  Follow a diet as told by your doctor.  Take all medicines only as told by your doctor. This information is not intended to replace advice given to you by your health care provider. Make sure you discuss any questions you have with your health care provider. Document Released: 06/29/2007 Document Revised: 07/19/2017 Document Reviewed: 07/19/2017 Elsevier Interactive Patient Education  2019 [employer]

## 2018-03-06 ENCOUNTER — Ambulatory Visit: Payer: Medicare HMO | Admitting: Family

## 2018-03-07 ENCOUNTER — Other Ambulatory Visit: Payer: Self-pay

## 2018-03-07 ENCOUNTER — Ambulatory Visit (INDEPENDENT_AMBULATORY_CARE_PROVIDER_SITE_OTHER): Payer: Medicare HMO | Admitting: Family

## 2018-03-07 ENCOUNTER — Encounter: Payer: Self-pay | Admitting: Family

## 2018-03-07 VITALS — BP 130/72 | HR 65 | Temp 97.8°F | Ht 65.0 in | Wt 168.0 lb

## 2018-03-07 DIAGNOSIS — E1142 Type 2 diabetes mellitus with diabetic polyneuropathy: Secondary | ICD-10-CM | POA: Diagnosis not present

## 2018-03-07 DIAGNOSIS — I10 Essential (primary) hypertension: Secondary | ICD-10-CM

## 2018-03-07 DIAGNOSIS — E785 Hyperlipidemia, unspecified: Secondary | ICD-10-CM

## 2018-03-07 DIAGNOSIS — H6121 Impacted cerumen, right ear: Secondary | ICD-10-CM

## 2018-03-07 DIAGNOSIS — I119 Hypertensive heart disease without heart failure: Secondary | ICD-10-CM

## 2018-03-07 DIAGNOSIS — Z0000 Encounter for general adult medical examination without abnormal findings: Secondary | ICD-10-CM

## 2018-03-07 MED ORDER — METFORMIN HCL 1000 MG PO TABS: ORAL_TABLET | ORAL | 1 refills | Status: DC

## 2018-03-07 NOTE — Addendum Note (Signed)
Addended by: Asher Muir A on: 03/07/2018 10:22 AM   Modules accepted: Orders

## 2018-03-07 NOTE — Progress Notes (Addendum)
Provider: Densil Ottey FNP-C  Danny Chandler, NP  Patient Care Team: Danny Chandler, NP as PCP - General (Geriatric Medicine)  Extended Emergency Contact Information Primary Emergency Contact: Danny Winters,Danny Winters Address: [place], [place] Phone: [phone] Relation: Spouse Secondary Emergency Contact: Danny Winters [place] Phone: [phone] Relation: Daughter   Goals of care: Advanced Directive information Advanced Directives 12/18/2017  Does Patient Have a Medical Advance Directive? No  Would patient like information on creating a medical advance directive? Yes (MAU/Ambulatory/Procedural Areas - Information given)  Pre-existing out of facility DNR order (yellow form or pink MOST form) -     Chief Complaint  Patient presents with  . Acute Visit    Right ear fullness, used Debrox   . Medication Refill    Metforming, 90 day supply   . Best Practice Recommendations    Discuss Hep C recommendation     HPI:  Pt is a 73 y.o. male seen today for an acute visit for right ear cerumen removal.He states Winters used debrox otic drops as directed.He denies any pain in the ear,fever or chills. He also request refill for his metformin.He checks his CBG but does not record readings.He states CBG ranging in the 80's-130's.he denies any signs of hypo/hyperglycemia.    Past Medical History:  Diagnosis Date  . Lumbago   . Memory loss   . Other and unspecified hyperlipidemia   . Type II or unspecified type diabetes mellitus without mention of complication, uncontrolled   . Unspecified arthropathy, shoulder region   . Unspecified essential hypertension   . Unspecified hereditary and idiopathic peripheral neuropathy   . Unspecified vitamin D deficiency    Past Surgical History:  Procedure Laterality Date  . COLONOSCOPY  2005   Dr Danny Winters  . COLONOSCOPY W/ POLYPECTOMY  2012   Danny Winters-  sessile polyp in transverse colon 4 mm and another 6 mm  . KIDNEY STONE SURGERY  2013   Dr Danny Winters    Allergies  Allergen Reactions  . Lisinopril Itching    Outpatient Encounter Medications as of 03/07/2018  Medication Sig  . ACCU-CHEK SOFTCLIX LANCETS lancets Use to test blood sugar twice daily. Dx: E11.42  . Alcohol Swabs (B-D SINGLE USE SWABS REGULAR) PADS Use daily as directed when checking blood sugar  E11.42  . aspirin 81 MG tablet Take 81 mg by mouth daily.  . Blood Glucose Monitoring Suppl (ACCU-CHEK AVIVA PLUS) w/Device KIT Use to test blood sugar twice daily. Dx: E11.42  . calcium-vitamin D (OSCAL 500/200 D-3) 500-200 MG-UNIT per tablet Take 1 tablet by mouth 2 (two) times daily.  Marland Kitchen gabapentin (NEURONTIN) 100 MG capsule Take 1 capsule (100 mg total) by mouth at bedtime.  Marland Kitchen glucose blood (ACCU-CHEK AVIVA PLUS) test strip Use to check blood sugar twice daily. Dx: E11.42  . irbesartan (AVAPRO) 75 MG tablet Take 1 tablet (75 mg total) by mouth daily. Future refills should go to mail order  . meloxicam (MOBIC) 7.5 MG tablet Take 1 tablet (7.5 mg total) by mouth daily as needed for pain. TAKE 1 TABLET EVERY DAY  FOR  JOINT  PAIN AND BACK PAIN  . metFORMIN (GLUCOPHAGE) 1000 MG tablet TAKE 1 TABLET TWICE DAILY FOR BLOOD SUGAR  . omeprazole (PRILOSEC) 20 MG capsule Take 1 capsule (20 mg total) by mouth daily. Take daily x 8 weeks then take one by mouth as  needed  . simvastatin (ZOCOR) 10 MG tablet TAKE 1 TABLET ONE TIME DAILY FOR CHOLESTEROL   No facility-administered encounter medications on file as of 03/07/2018.     Review of Systems  Constitutional: Negative for chills, fatigue and fever.  HENT: Positive for tinnitus. Negative for congestion, postnasal drip, rhinorrhea, sinus pressure, sinus pain, sneezing, sore throat and trouble swallowing.   Eyes: Negative for discharge, redness, itching and visual disturbance.  Respiratory: Negative for cough, chest tightness, shortness of  breath and wheezing.   Endocrine: Negative for polydipsia, polyphagia and polyuria.  Skin: Negative for color change, pallor and rash.  Neurological: Negative for dizziness, light-headedness and headaches.    Immunization History  Administered Date(s) Administered  . Influenza, High Dose Seasonal PF 12/18/2017  . Influenza,inj,Quad PF,6+ Mos 11/13/2012, 11/20/2013, 01/23/2015, 11/27/2015, 04/18/2017  . Influenza-Unspecified 11/10/2011  . Pneumococcal Conjugate-13 04/08/2014  . Pneumococcal Polysaccharide-23 10/01/2008, 11/13/2012  . Tdap 01/24/2010   Pertinent  Health Maintenance Due  Topic Date Due  . HEMOGLOBIN A1C  06/18/2018  . FOOT EXAM  12/19/2018  . OPHTHALMOLOGY EXAM  01/26/2019  . COLONOSCOPY  12/13/2020  . INFLUENZA VACCINE  Completed  . PNA vac Low Risk Adult  Completed   Fall Risk  03/07/2018 03/01/2018 12/18/2017 12/18/2017 04/18/2017  Falls in the past year? 0 0 0 0 Yes  Comment - - - - -  Number falls in past yr: 0 0 - 0 1  Comment - - - - SLIPPED IN RAIN   Injury with Fall? 0 0 - 0 No    Vitals:   03/07/18 0907  BP: 130/72  Pulse: 65  Temp: 97.8 F (36.6 C)  TempSrc: Oral  SpO2: 97%  Weight: 168 lb (76.2 kg)  Height: _0  (1.651 m)   Body mass index is 27.96 kg/m. Physical Exam Constitutional:      General: He is not in acute distress.    Appearance: He is overweight.  HENT:     Head: Normocephalic.     Right Ear: There is impacted cerumen.     Left Ear: Tympanic membrane, ear canal and external ear normal. There is no impacted cerumen.     Ears:     Comments: Right ear cerumen lavaged with warm water large amounts of cerumen also removed using curette  instrument.Patient tolerated procedure well.TM clear.     Nose: No congestion or rhinorrhea.     Mouth/Throat:     Mouth: Mucous membranes are moist.     Pharynx: Oropharynx is clear. No oropharyngeal exudate or posterior oropharyngeal erythema.  Eyes:     General: No scleral icterus.        Right eye: No discharge.        Left eye: No discharge.     Conjunctiva/sclera: Conjunctivae normal.     Pupils: Pupils are equal, round, and reactive to light.  Cardiovascular:     Rate and Rhythm: Normal rate and regular rhythm.     Pulses: Normal pulses.     Heart sounds: Normal heart sounds. No murmur. No friction rub. No gallop.   Pulmonary:     Effort: Pulmonary effort is normal. No respiratory distress.     Breath sounds: Normal breath sounds. No wheezing, rhonchi or rales.  Chest:     Chest wall: No tenderness.  Abdominal:     General: Bowel sounds are normal. There is no distension.     Palpations: Abdomen is soft. There is no mass.     Tenderness:  There is no abdominal tenderness. There is no right CVA tenderness, left CVA tenderness, guarding or rebound.  Skin:    General: Skin is warm and dry.     Coloration: Skin is not pale.     Findings: No erythema or rash.  Neurological:     Mental Status: He is alert and oriented to person, place, and time.     Cranial Nerves: No cranial nerve deficit.     Sensory: No sensory deficit.     Gait: Gait normal.  Psychiatric:        Mood and Affect: Mood normal.        Behavior: Behavior normal.        Thought Content: Thought content normal.        Judgment: Judgment normal.    Labs reviewed: Recent Labs    04/18/17 1231 10/08/17 2000 12/18/17 1429  NA 136 139 136  K 4.1 3.8 3.7  CL 101 103 100  CO2 _0 GLUCOSE 122 169* 197*  BUN 17 24* 18  CREATININE 1.16 1.18 1.09  CALCIUM 9.5 9.6 9.5   Recent Labs    04/18/17 1231 10/08/17 2000 12/18/17 1429  AST _1 ALT _2 ALKPHOS  --  49  --   BILITOT 1.1 1.4* 1.0  PROT 7.2 7.9 6.6  ALBUMIN  --  4.3  --    Recent Labs    04/18/17 1231 10/08/17 2000  WBC 4.1 8.8  NEUTROABS 2,157  --   HGB 15.5 14.6  HCT 45.0 42.7  MCV 83.5 86.6  PLT 188 178   Lab Results  Component Value Date   TSH 1.89 04/18/2017   Lab Results  Component Value Date    HGBA1C 7.2 (H) 12/18/2017   Lab Results  Component Value Date   CHOL 120 04/18/2017   HDL 41 04/18/2017   LDLCALC 65 04/18/2017   TRIG 61 04/18/2017   CHOLHDL 2.9 04/18/2017    Significant Diagnostic Results in last 30 days:  No results found.  Assessment/Plan 1. Impacted cerumen of right ear Right ear lavaged with warm water and cerumen also removed using curette  instrument large amounts of cerumen removed.Patient tolerated procedure well.encouraged to notify provide if running any fever> 100.5,or having any ear pain or drainage.   2. DM type 2 with diabetic peripheral neuropathy (HCC) Lab Results  Component Value Date   HGBA1C 7.2 (H) 12/18/2017  - no CBG log brought to visit.log provided this visit. - metFORMIN (GLUCOPHAGE) 1000 MG tablet; 1 by mouth twice daily for blood sugar  Dispense: 180 tablet; Refill: 1  Family/ staff Communication: Reviewed plan of care with patient   Labs/tests ordered: CBC/diff,CMP,Hgb A1C,TSH level and Lipid panel scheduled prior to next visit.   Follow up: 3 weeks for medical management of chronic issues.   Sandrea Hughs, NP

## 2018-03-07 NOTE — Patient Instructions (Signed)
Notify provider if running any fever>100.5,pain in the ear or drainage.   Earwax Buildup, Adult The ears produce a substance called earwax that helps keep bacteria out of the ear and protects the skin in the ear canal. Occasionally, earwax can build up in the ear and cause discomfort or hearing loss. What increases the risk? This condition is more likely to develop in people who:  Are male.  Are elderly.  Naturally produce more earwax.  Clean their ears often with cotton swabs.  Use earplugs often.  Use in-ear headphones often.  Wear hearing aids.  Have narrow ear canals.  Have earwax that is overly thick or sticky.  Have eczema.  Are dehydrated.  Have excess hair in the ear canal. What are the signs or symptoms? Symptoms of this condition include:  Reduced or muffled hearing.  A feeling of fullness in the ear or feeling that the ear is plugged.  Fluid coming from the ear.  Ear pain.  Ear itch.  Ringing in the ear.  Coughing.  An obvious piece of earwax that can be seen inside the ear canal. How is this diagnosed? This condition may be diagnosed based on:  Your symptoms.  Your medical history.  An ear exam. During the exam, your health care provider will look into your ear with an instrument called an otoscope. You may have tests, including a hearing test. How is this treated? This condition may be treated by:  Using ear drops to soften the earwax.  Having the earwax removed by a health care provider. The health care provider may: ? Flush the ear with water. ? Use an instrument that has a loop on the end (curette). ? Use a suction device.  Surgery to remove the wax buildup. This may be done in severe cases. Follow these instructions at home:   Take over-the-counter and prescription medicines only as told by your health care provider.  Do not put any objects, including cotton swabs, into your ear. You can clean the opening of your ear canal with  a washcloth or facial tissue.  Follow instructions from your health care provider about cleaning your ears. Do not over-clean your ears.  Drink enough fluid to keep your urine clear or pale yellow. This will help to thin the earwax.  Keep all follow-up visits as told by your health care provider. If earwax builds up in your ears often or if you use hearing aids, consider seeing your health care provider for routine, preventive ear cleanings. Ask your health care provider how often you should schedule your cleanings.  If you have hearing aids, clean them according to instructions from the manufacturer and your health care provider. Contact a health care provider if:  You have ear pain.  You develop a fever.  You have blood, pus, or other fluid coming from your ear.  You have hearing loss.  You have ringing in your ears that does not go away.  Your symptoms do not improve with treatment.  You feel like the room is spinning (vertigo). Summary  Earwax can build up in the ear and cause discomfort or hearing loss.  The most common symptoms of this condition include reduced or muffled hearing and a feeling of fullness in the ear or feeling that the ear is plugged.  This condition may be diagnosed based on your symptoms, your medical history, and an ear exam.  This condition may be treated by using ear drops to soften the earwax or by having  the earwax removed by a health care provider.  Do not put any objects, including cotton swabs, into your ear. You can clean the opening of your ear canal with a washcloth or facial tissue. This information is not intended to replace advice given to you by your health care provider. Make sure you discuss any questions you have with your health care provider. Document Released: 02/18/2004 Document Revised: 12/22/2016 Document Reviewed: 03/23/2016 Elsevier Interactive Patient Education  2019 [employer]    Blood Glucose Monitoring,  Adult Monitoring your blood sugar (glucose) is an important part of managing your diabetes (diabetes mellitus). Blood glucose monitoring involves checking your blood glucose as often as directed and keeping a record (log) of your results over time. Checking your blood glucose regularly and keeping a blood glucose log can:  Help you and your health care provider adjust your diabetes management plan as needed, including your medicines or insulin.  Help you understand how food, exercise, illnesses, and medicines affect your blood glucose.  Let you know what your blood glucose is at any time. You can quickly find out if you have low blood glucose (hypoglycemia) or high blood glucose (hyperglycemia). Your health care provider will set individualized treatment goals for you. Your goals will be based on your age, other medical conditions you have, and how you respond to diabetes treatment. Generally, the goal of treatment is to maintain the following blood glucose levels:  Before meals (preprandial): 80-130 mg/dL (0.6-2.6 mmol/L).  After meals (postprandial): below 180 mg/dL (10 mmol/L).  A1c level: less than 7%. Supplies needed:  Blood glucose meter.  Test strips for your meter. Each meter has its own strips. You must use the strips that came with your meter.  A needle to prick your finger (lancet). Do not use a lancet more than one time.  A device that holds the lancet (lancing device).  A journal or log book to write down your results. How to check your blood glucose  1. Wash your hands with soap and water. 2. Prick the side of your finger (not the tip) with the lancet. Use a different finger each time. 3. Gently rub the finger until a small drop of blood appears. 4. Follow instructions that come with your meter for inserting the test strip, applying blood to the strip, and using your blood glucose meter. 5. Write down your result and any notes. Some meters allow you to use areas of  your body other than your finger (alternative sites) to test your blood. The most common alternative sites are:  Forearm.  Thigh.  Palm of the hand. If you think you may have hypoglycemia, or if you have a history of not knowing when your blood glucose is getting low (hypoglycemia unawareness), do not use alternative sites. Use your finger instead. Alternative sites may not be as accurate as the fingers, because blood flow is slower in these areas. This means that the result you get may be delayed, and it may be different from the result that you would get from your finger. Follow these instructions at home: Blood glucose log   Every time you check your blood glucose, write down your result. Also write down any notes about things that may be affecting your blood glucose, such as your diet and exercise for the day. This information can help you and your health care provider: ? Look for patterns in your blood glucose over time. ? Adjust your diabetes management plan as needed.  Check if your  meter allows you to download your records to a computer. Most glucose meters store a record of glucose readings in the meter. If you have type 1 diabetes:  Check your blood glucose 2 or more times a day.  Also check your blood glucose: ? Before every insulin injection. ? Before and after exercise. ? Before meals. ? 2 hours after a meal. ? Occasionally between 2:00 a.m. and 3:00 a.m., as directed. ? Before potentially dangerous tasks, like driving or using heavy machinery. ? At bedtime.  You may need to check your blood glucose more often, up to 6-10 times a day, if you: ? Use an insulin pump. ? Need multiple daily injections (MDI). ? Have diabetes that is not well-controlled. ? Are ill. ? Have a history of severe hypoglycemia. ? Have hypoglycemia unawareness. If you have type 2 diabetes:  If you take insulin or other diabetes medicines, check your blood glucose 2 or more times a day.  If  you are on intensive insulin therapy, check your blood glucose 4 or more times a day. Occasionally, you may also need to check between 2:00 a.m. and 3:00 a.m., as directed.  Also check your blood glucose: ? Before and after exercise. ? Before potentially dangerous tasks, like driving or using heavy machinery.  You may need to check your blood glucose more often if: ? Your medicine is being adjusted. ? Your diabetes is not well-controlled. ? You are ill. General tips  Always keep your supplies with you.  If you have questions or need help, all blood glucose meters have a 24-hour "hotline" phone number that you can call. You may also contact your health care provider.  After you use a few boxes of test strips, adjust (calibrate) your blood glucose meter by following instructions that came with your meter. Contact a health care provider if:  Your blood glucose is at or above 240 mg/dL (00.7 mmol/L) for 2 days in a row.  You have been sick or have had a fever for 2 days or longer, and you are not getting better.  You have any of the following problems for more than 6 hours: ? You cannot eat or drink. ? You have nausea or vomiting. ? You have diarrhea. Get help right away if:  Your blood glucose is lower than 54 mg/dL (3 mmol/L).  You become confused or you have trouble thinking clearly.  You have difficulty breathing.  You have moderate or large ketone levels in your urine. Summary  Monitoring your blood sugar (glucose) is an important part of managing your diabetes (diabetes mellitus).  Blood glucose monitoring involves checking your blood glucose as often as directed and keeping a record (log) of your results over time.  Your health care provider will set individualized treatment goals for you. Your goals will be based on your age, other medical conditions you have, and how you respond to diabetes treatment.  Every time you check your blood glucose, write down your result.  Also write down any notes about things that may be affecting your blood glucose, such as your diet and exercise for the day. This information is not intended to replace advice given to you by your health care provider. Make sure you discuss any questions you have with your health care provider. Document Released: 01/13/2003 Document Revised: 11/21/2016 Document Reviewed: 06/22/2015 Elsevier Interactive Patient Education  2019 [employer].

## 2018-03-12 ENCOUNTER — Other Ambulatory Visit: Payer: Self-pay | Admitting: *Deleted

## 2018-03-12 DIAGNOSIS — G8929 Other chronic pain: Secondary | ICD-10-CM

## 2018-03-12 DIAGNOSIS — M545 Low back pain: Principal | ICD-10-CM

## 2018-03-12 MED ORDER — MELOXICAM 7.5 MG PO TABS: 7.5000 mg | ORAL_TABLET | Freq: Every day | ORAL | 0 refills | Status: DC | PRN

## 2018-03-12 MED ORDER — MELOXICAM 7.5 MG PO TABS: 7.5000 mg | ORAL_TABLET | Freq: Every day | ORAL | 0 refills | Status: AC | PRN

## 2018-03-23 ENCOUNTER — Other Ambulatory Visit: Payer: Self-pay | Admitting: Family

## 2018-04-06 ENCOUNTER — Other Ambulatory Visit: Payer: Self-pay | Admitting: Nurse Practitioner

## 2018-04-06 DIAGNOSIS — M545 Low back pain, unspecified: Secondary | ICD-10-CM

## 2018-04-06 DIAGNOSIS — G8929 Other chronic pain: Secondary | ICD-10-CM

## 2018-04-06 NOTE — Telephone Encounter (Signed)
Please advise, RX indicated for 30 days

## 2018-05-15 ENCOUNTER — Other Ambulatory Visit: Payer: Self-pay

## 2018-05-15 ENCOUNTER — Encounter: Payer: Self-pay | Admitting: Family

## 2018-05-15 ENCOUNTER — Ambulatory Visit (INDEPENDENT_AMBULATORY_CARE_PROVIDER_SITE_OTHER): Payer: Medicare HMO | Admitting: Family

## 2018-05-15 DIAGNOSIS — E785 Hyperlipidemia, unspecified: Secondary | ICD-10-CM

## 2018-05-15 DIAGNOSIS — K219 Gastro-esophageal reflux disease without esophagitis: Secondary | ICD-10-CM

## 2018-05-15 DIAGNOSIS — E1142 Type 2 diabetes mellitus with diabetic polyneuropathy: Secondary | ICD-10-CM | POA: Diagnosis not present

## 2018-05-15 DIAGNOSIS — I119 Hypertensive heart disease without heart failure: Secondary | ICD-10-CM

## 2018-05-15 MED ORDER — IRBESARTAN 75 MG PO TABS: 75.0000 mg | ORAL_TABLET | Freq: Every day | ORAL | 3 refills | Status: DC

## 2018-05-15 NOTE — Progress Notes (Signed)
This service is provided via telemedicine  No vital signs collected/recorded due to the encounter was a telemedicine visit.   Location of patient (ex: home, work):  Home  Patient consents to a telephone visit:  Yes   Location of the provider (ex: office, home):  Office   Name of any referring provider:  Sherrie Mustache NP  Names of all persons participating in the telemedicine service and their role in the encounter:  Ruthell Rummage CMA, Shafin Pollio NP, and Michelene Gardener  Time spent on call:  Ruthell Rummage CMA, spent  5  Minutes with patient on the phone      Location:   [place]   Place of Service:   [facility]  Provider: Marlowe Sax, NP  Code Status:  Goals of Care:  Advanced Directives 12/18/2017  Does Patient Have a Medical Advance Directive? No  Would patient like information on creating a medical advance directive? Yes (MAU/Ambulatory/Procedural Areas - Information given)  Pre-existing out of facility DNR order (yellow form or pink MOST form) -     Chief Complaint  Patient presents with  . Medical Management of Chronic Issues    4 month follow up    HPI: Patient is a 73 y.o. male seen today for medical management of chronic diseases. He denies any acute issues during visit. Hypertension - he states run out of his blood pressure medication over one month has not been able to get in contact with [facility] for medication refill though did not call provider's office.He does not check his blood pressure at home but states can check it next time he goes to the pharmacy.He denies any headache,dizziness,shortness of breath or chest pain.   Type 2 DM- Blood sugars has been under control readings ranging in the 80's-140's.He denies any signs of hypo/hypergylcemia.He states continues to have numbness and tingling on the hands but none on the feet.   GERD- symptoms have improved with use of omeprazole daily.  Hyperlipidemia - currently on simvastatin 10 mg tablet.daily.He states  has tried to include vegetables in his diet such as green beans.Also states usually exercises by walking but has not been able to walk due to currently COVID-19 restrictions.   Past Medical History:  Diagnosis Date  . Lumbago   . Memory loss   . Other and unspecified hyperlipidemia   . Type II or unspecified type diabetes mellitus without mention of complication, uncontrolled   . Unspecified arthropathy, shoulder region   . Unspecified essential hypertension   . Unspecified hereditary and idiopathic peripheral neuropathy   . Unspecified vitamin D deficiency     Past Surgical History:  Procedure Laterality Date  . COLONOSCOPY  2005   Dr Sharlotte Alamo Medoff  . COLONOSCOPY W/ POLYPECTOMY  2012   [facility]- sessile polyp in transverse colon 4 mm and another 6 mm  . KIDNEY STONE SURGERY  2013   Dr Kathie Rhodes    Allergies  Allergen Reactions  . Lisinopril Itching    Outpatient Encounter Medications as of 05/15/2018  Medication Sig  . ACCU-CHEK SOFTCLIX LANCETS lancets Use to test blood sugar twice daily. Dx: E11.42  . Alcohol Swabs (B-D SINGLE USE SWABS REGULAR) PADS Use daily as directed when checking blood sugar  E11.42  . aspirin 81 MG tablet Take 81 mg by mouth daily.  . Blood Glucose Monitoring Suppl (ACCU-CHEK AVIVA PLUS) w/Device KIT Use to test blood sugar twice daily. Dx: E11.42  . calcium-vitamin D (OSCAL 500/200 D-3) 500-200 MG-UNIT per tablet Take  1 tablet by mouth 2 (two) times daily.  Marland Kitchen gabapentin (NEURONTIN) 100 MG capsule Take 1 capsule (100 mg total) by mouth at bedtime.  Marland Kitchen glucose blood (ACCU-CHEK AVIVA PLUS) test strip Use to check blood sugar twice daily. Dx: E11.42  . irbesartan (AVAPRO) 75 MG tablet Take 1 tablet (75 mg total) by mouth daily. Future refills should go to mail order  . metFORMIN (GLUCOPHAGE) 1000 MG tablet 1 by mouth twice daily for blood sugar  . omeprazole (PRILOSEC) 20 MG capsule TAKE 1 CAPSULE (20 MG TOTAL) BY MOUTH DAILY. TAKE DAILY X 8  WEEKS THEN TAKE ONE BY MOUTH AS NEEDED  . simvastatin (ZOCOR) 10 MG tablet TAKE 1 TABLET ONE TIME DAILY FOR CHOLESTEROL   No facility-administered encounter medications on file as of 05/15/2018.     Review of Systems:  Review of Systems  Constitutional: Negative for appetite change, chills, fatigue and fever.  HENT: Negative for congestion, rhinorrhea, sinus pressure, sinus pain, sneezing and sore throat.   Eyes: Negative for discharge, redness, itching and visual disturbance.  Respiratory: Negative for cough, chest tightness, shortness of breath and wheezing.   Cardiovascular: Negative for chest pain, palpitations and leg swelling.  Gastrointestinal: Negative for abdominal distention, abdominal pain, constipation, diarrhea, nausea and vomiting.  Endocrine: Negative for cold intolerance, heat intolerance, polydipsia, polyphagia and polyuria.  Genitourinary: Negative for dysuria, flank pain, frequency and urgency.  Musculoskeletal: Negative for arthralgias and gait problem.  Skin: Negative for color change, pallor and rash.  Neurological: Negative for dizziness, syncope, light-headedness and headaches.  Hematological: Does not bruise/bleed easily.  Psychiatric/Behavioral: Negative for agitation, confusion and sleep disturbance. The patient is not nervous/anxious.    Health Maintenance  Topic Date Due  . Hepatitis C Screening  February 16, 1945  . HEMOGLOBIN A1C  06/18/2018  . INFLUENZA VACCINE  08/25/2018  . FOOT EXAM  12/19/2018  . OPHTHALMOLOGY EXAM  01/26/2019  . TETANUS/TDAP  01/25/2020  . COLONOSCOPY  12/13/2020  . PNA vac Low Risk Adult  Completed    Physical Exam: There were no vitals filed for this visit. There is no height or weight on file to calculate BMI. Physical Exam Unable to complete Physical exam due to Telephone visit  Labs reviewed: Basic Metabolic Panel: Recent Labs    10/08/17 2000 12/18/17 1429  NA 139 136  K 3.8 3.7  CL 103 100  CO2 25 27  GLUCOSE 169*  197*  BUN 24* 18  CREATININE 1.18 1.09  CALCIUM 9.6 9.5   Liver Function Tests: Recent Labs    10/08/17 2000 12/18/17 1429  AST 26 17  ALT 16 10  ALKPHOS 49  --   BILITOT 1.4* 1.0  PROT 7.9 6.6  ALBUMIN 4.3  --    Recent Labs    10/08/17 2000  LIPASE 41   CBC: Recent Labs    10/08/17 2000  WBC 8.8  HGB 14.6  HCT 42.7  MCV 86.6  PLT 178    Lab Results  Component Value Date   HGBA1C 7.2 (H) 12/18/2017    Procedures since last visit:  Assessment/Plan 1. Benign hypertensive heart disease without heart failure Has been off blood pressure medication for several days.asymptomatic. Avapro 75 mg tablet daily # 90 refilled.continue on ASA 81 mg tablet daily and statin  for cardiovascular event prophylaxis.  2. DM type 2 with diabetic peripheral neuropathy (HCC) Lab Results  Component Value Date   HGBA1C 7.2 (H) 12/18/2017  - CBG controlled.continue on metformin 1000 mg tablet  twice daily. - Hgb A1C labs pending.  - continue on Gabapentin 100 mg capsule daily for neuropathy.  - up to date on annual eye and foot exam. - continue on ASA and Statin.  3. Hyperlipidemia LDL goal <100 Continue on simvastatin 10 mg tablet daily Lipid panel labs pending.   4. Gastroesophageal reflux disease without esophagitis Symptoms controlled.continue on omeprazole 20 mg capsule daily.  Labs/tests ordered: labs pending to be drawn 05/16/18 Next appt: 4 months

## 2018-05-18 ENCOUNTER — Other Ambulatory Visit: Payer: Medicare HMO

## 2018-05-18 ENCOUNTER — Other Ambulatory Visit: Payer: Self-pay

## 2018-05-18 DIAGNOSIS — Z0000 Encounter for general adult medical examination without abnormal findings: Secondary | ICD-10-CM

## 2018-05-18 DIAGNOSIS — I10 Essential (primary) hypertension: Secondary | ICD-10-CM

## 2018-05-18 DIAGNOSIS — E785 Hyperlipidemia, unspecified: Secondary | ICD-10-CM

## 2018-05-18 DIAGNOSIS — E1142 Type 2 diabetes mellitus with diabetic polyneuropathy: Secondary | ICD-10-CM | POA: Diagnosis not present

## 2018-05-18 DIAGNOSIS — I119 Hypertensive heart disease without heart failure: Secondary | ICD-10-CM

## 2018-05-19 LAB — COMPLETE METABOLIC PANEL WITH GFR
AG Ratio: 1.4 (calc) (ref 1.0–2.5)
ALT: 11 U/L (ref 9–46)
AST: 18 U/L (ref 10–35)
Albumin: 4.1 g/dL (ref 3.6–5.1)
Alkaline phosphatase (APISO): 56 U/L (ref 35–144)
BUN: 15 mg/dL (ref 7–25)
CO2: 30 mmol/L (ref 20–32)
Calcium: 9.3 mg/dL (ref 8.6–10.3)
Chloride: 105 mmol/L (ref 98–110)
Creat: 1.13 mg/dL (ref 0.70–1.18)
GFR, Est African American: 74 mL/min/{1.73_m2} (ref >=60)
GFR, Est Non African American: 64 mL/min/{1.73_m2} (ref >=60)
Globulin: 2.9 g/dL (calc) (ref 1.9–3.7)
Glucose, Bld: 131 mg/dL — ABNORMAL HIGH (ref 65–99)
Potassium: 4.1 mmol/L (ref 3.5–5.3)
Sodium: 141 mmol/L (ref 135–146)
Total Bilirubin: 0.8 mg/dL (ref 0.2–1.2)
Total Protein: 7 g/dL (ref 6.1–8.1)

## 2018-05-19 LAB — CBC WITH DIFFERENTIAL/PLATELET
Absolute Monocytes: 302 cells/uL (ref 200–950)
Basophils Absolute: 22 cells/uL (ref 0–200)
Basophils Relative: 0.6 %
Eosinophils Absolute: 79 cells/uL (ref 15–500)
Eosinophils Relative: 2.2 %
HCT: 43.1 % (ref 38.5–50.0)
Hemoglobin: 14.5 g/dL (ref 13.2–17.1)
Lymphs Abs: 1346 cells/uL (ref 850–3900)
MCH: 28.8 pg (ref 27.0–33.0)
MCHC: 33.6 g/dL (ref 32.0–36.0)
MCV: 85.7 fL (ref 80.0–100.0)
MPV: 10.3 fL (ref 7.5–12.5)
Monocytes Relative: 8.4 %
Neutro Abs: 1850 cells/uL (ref 1500–7800)
Neutrophils Relative %: 51.4 %
Platelets: 204 10*3/uL (ref 140–400)
RBC: 5.03 10*6/uL (ref 4.20–5.80)
RDW: 12.8 % (ref 11.0–15.0)
Total Lymphocyte: 37.4 %
WBC: 3.6 10*3/uL — ABNORMAL LOW (ref 3.8–10.8)

## 2018-05-19 LAB — TSH: TSH: 1.72 mIU/L (ref 0.40–4.50)

## 2018-05-19 LAB — LIPID PANEL
Cholesterol: 109 mg/dL (ref <200)
HDL: 37 mg/dL — ABNORMAL LOW (ref >=40)
LDL Cholesterol (Calc): 59 mg/dL (calc)
Non-HDL Cholesterol (Calc): 72 mg/dL (calc) (ref <130)
Total CHOL/HDL Ratio: 2.9 (calc) (ref <5.0)
Triglycerides: 51 mg/dL (ref <150)

## 2018-05-19 LAB — HEMOGLOBIN A1C
Hgb A1c MFr Bld: 7.3 % of total Hgb — ABNORMAL HIGH (ref <5.7)
Mean Plasma Glucose: 163 (calc)
eAG (mmol/L): 9 (calc)

## 2018-07-25 ENCOUNTER — Other Ambulatory Visit: Payer: Self-pay | Admitting: *Deleted

## 2018-07-25 DIAGNOSIS — E785 Hyperlipidemia, unspecified: Secondary | ICD-10-CM

## 2018-07-25 MED ORDER — SIMVASTATIN 10 MG PO TABS: 10.0000 mg | ORAL_TABLET | Freq: Every day | ORAL | 0 refills | Status: DC

## 2018-08-17 ENCOUNTER — Other Ambulatory Visit: Payer: Self-pay | Admitting: Nurse Practitioner

## 2018-08-17 DIAGNOSIS — E785 Hyperlipidemia, unspecified: Secondary | ICD-10-CM

## 2018-09-14 ENCOUNTER — Ambulatory Visit: Payer: Medicare HMO | Admitting: Nurse Practitioner

## 2018-09-21 ENCOUNTER — Ambulatory Visit (INDEPENDENT_AMBULATORY_CARE_PROVIDER_SITE_OTHER): Payer: Medicare HMO | Admitting: Nurse Practitioner

## 2018-09-21 ENCOUNTER — Other Ambulatory Visit: Payer: Self-pay

## 2018-09-21 ENCOUNTER — Encounter: Payer: Self-pay | Admitting: Nurse Practitioner

## 2018-09-21 VITALS — BP 132/72 | HR 78 | Temp 98.0°F | Ht 65.0 in | Wt 168.0 lb

## 2018-09-21 DIAGNOSIS — I119 Hypertensive heart disease without heart failure: Secondary | ICD-10-CM

## 2018-09-21 DIAGNOSIS — K219 Gastro-esophageal reflux disease without esophagitis: Secondary | ICD-10-CM

## 2018-09-21 DIAGNOSIS — Z23 Encounter for immunization: Secondary | ICD-10-CM

## 2018-09-21 DIAGNOSIS — E1142 Type 2 diabetes mellitus with diabetic polyneuropathy: Secondary | ICD-10-CM | POA: Diagnosis not present

## 2018-09-21 DIAGNOSIS — E785 Hyperlipidemia, unspecified: Secondary | ICD-10-CM | POA: Diagnosis not present

## 2018-09-21 NOTE — Progress Notes (Signed)
Careteam: Patient Care Team: Lauree Chandler, NP as PCP - General (Geriatric Medicine)  Advanced Directive information    Allergies  Allergen Reactions  . Lisinopril Itching    Chief Complaint  Patient presents with  . Medical Management of Chronic Issues    45-monthfollowup, complains of reflux since hernia surgery  . Best Practice Recommendations    Hepatitis C screening  . Immunizations    High dose influenza     HPI: Patient is a 73y.o. male seen in the office today for routine follow up.   Hypertension - he denies any headache,dizziness,shortness of breath or chest pain. Taking iresartan 75 mg daily   Type 2 DM- blood sugar this mornings 80-110 (this mornings)He denies any signs of hypo/hypergylcemia. Denies numbness and tingling to feet   GERD-symptoms stable on omeprazole 20 mg daily.  Hyperlipidemia - currently on simvastatin 10 mg tablet.daily. Attempt to follow healthy diet [place] trying [place] limit [place].   Numbness and tingling to right hand- has had problems with vertebrae to c spine in the past- said it was arthritis.  Occasionally bothered by this- mostly in the morning when he sleeps on his elbow. Overall better.   Flu vaccine today Review of Systems:  Review of Systems  Constitutional: Negative for chills, fever and weight loss.  HENT: Negative for tinnitus.   Respiratory: Negative for cough, sputum production and shortness of breath.   Cardiovascular: Negative for chest pain, palpitations and leg swelling.  Gastrointestinal: Negative for abdominal pain, constipation, diarrhea and heartburn.  Genitourinary: Negative for dysuria, frequency and urgency.  Musculoskeletal: Negative for back pain, falls, joint pain and myalgias.  Skin: Negative.   Neurological: Positive for tingling (occasional to right hand). Negative for dizziness and headaches.  Psychiatric/Behavioral: Negative for depression and memory loss. The  patient does not have insomnia.     Past Medical History:  Diagnosis Date  . GERD (gastroesophageal reflux disease)   . Lumbago   . Memory loss   . Other and unspecified hyperlipidemia   . Type II or unspecified type diabetes mellitus without mention of complication, uncontrolled   . Unspecified arthropathy, shoulder region   . Unspecified essential hypertension   . Unspecified hereditary and idiopathic peripheral neuropathy   . Unspecified vitamin D deficiency    Past Surgical History:  Procedure Laterality Date  . COLONOSCOPY  2005   Dr JSharlotte AlamoMedoff  . COLONOSCOPY W/ POLYPECTOMY  2012   [facility]- sessile polyp in transverse colon 4 mm and another 6 mm  . INGUINAL HERNIA REPAIR  2019  . KIDNEY STONE SURGERY  2013   Dr MKathie Rhodes  Social History:   reports that he quit smoking about 45 years ago. He has a 11.00 pack-year smoking history. He has never used smokeless tobacco. He reports that he does not drink alcohol or use drugs.  Family History  Problem Relation Age of Onset  . Heart disease Mother   . Heart attack Father   . Cancer Sister        colon  . Cancer Brother        colon  . Cancer Brother        colon  . Cancer Brother        prostate  . Cancer Brother        prostate  . Cancer Brother        Pancreas  . Stroke Other  Medications: Patient's Medications  New Prescriptions   No medications on file  Previous Medications   ACCU-CHEK SOFTCLIX LANCETS LANCETS    Use to test blood sugar twice daily. Dx: E11.42   ALCOHOL SWABS (B-D SINGLE USE SWABS REGULAR) PADS    Use daily as directed when checking blood sugar  E11.42   ASPIRIN 81 MG TABLET    Take 81 mg by mouth daily.   BLOOD GLUCOSE MONITORING SUPPL (ACCU-CHEK AVIVA PLUS) W/DEVICE KIT    Use to test blood sugar twice daily. Dx: E11.42   CALCIUM-VITAMIN D (OSCAL 500/200 D-3) 500-200 MG-UNIT PER TABLET    Take 1 tablet by mouth 2 (two) times daily.   GABAPENTIN (NEURONTIN) 100 MG CAPSULE     Take 1 capsule (100 mg total) by mouth at bedtime.   GLUCOSE BLOOD (ACCU-CHEK AVIVA PLUS) TEST STRIP    Use to check blood sugar twice daily. Dx: E11.42   IRBESARTAN (AVAPRO) 75 MG TABLET    Take 1 tablet (75 mg total) by mouth daily. Future refills should go to mail order   METFORMIN (GLUCOPHAGE) 1000 MG TABLET    1 by mouth twice daily for blood sugar   OMEPRAZOLE (PRILOSEC OTC) 20 MG TABLET    Take 20 mg by mouth daily.   SIMVASTATIN (ZOCOR) 10 MG TABLET    TAKE 1 TABLET (10 MG TOTAL) BY MOUTH DAILY AT 6 PM.  Modified Medications   No medications on file  Discontinued Medications   OMEPRAZOLE (PRILOSEC) 20 MG CAPSULE    TAKE 1 CAPSULE (20 MG TOTAL) BY MOUTH DAILY. TAKE DAILY X 8 WEEKS THEN TAKE ONE BY MOUTH AS NEEDED    Physical Exam:  Vitals:   09/21/18 1033  BP: 132/72  Pulse: 78  Temp: 98 F (36.7 C)  TempSrc: Oral  SpO2: 93%  Weight: 168 lb (76.2 kg)  Height: '5\' 5"'  (1.651 m)   Body mass index is 27.96 kg/m. Wt Readings from Last 3 Encounters:  09/21/18 168 lb (76.2 kg)  03/07/18 168 lb (76.2 kg)  03/01/18 174 lb 6.4 oz (79.1 kg)    Physical Exam Constitutional:      Appearance: Normal appearance. He is well-developed.  Eyes:     General: No scleral icterus.    Pupils: Pupils are equal, round, and reactive to light.  Neck:     Musculoskeletal: Neck supple.     Thyroid: No thyromegaly.     Vascular: No carotid bruit.  Cardiovascular:     Rate and Rhythm: Normal rate and regular rhythm.     Heart sounds: Murmur (1/6 SEM) present. No friction rub. No gallop.   Pulmonary:     Effort: Pulmonary effort is normal.     Breath sounds: Normal breath sounds.  Abdominal:     General: Bowel sounds are normal. There is no abdominal bruit.     Palpations: Abdomen is soft. Abdomen is not rigid. There is no hepatomegaly or pulsatile mass.  Musculoskeletal:     Right lower leg: No edema.     Left lower leg: No edema.  Lymphadenopathy:     Cervical: No cervical  adenopathy.  Skin:    General: Skin is warm and dry.     Findings: No rash.  Neurological:     Mental Status: He is alert and oriented to person, place, and time.     Deep Tendon Reflexes: Reflexes are normal and symmetric.  Psychiatric:        Behavior: Behavior normal.  Thought Content: Thought content normal.        Judgment: Judgment normal.     Labs reviewed: Basic Metabolic Panel: Recent Labs    10/08/17 2000 12/18/17 1429 05/18/18 0925  NA 139 136 141  K 3.8 3.7 4.1  CL 103 100 105  CO2 '25 27 30  ' GLUCOSE 169* 197* 131*  BUN 24* 18 15  CREATININE 1.18 1.09 1.13  CALCIUM 9.6 9.5 9.3  TSH  --   --  1.72   Liver Function Tests: Recent Labs    10/08/17 2000 12/18/17 1429 05/18/18 0925  AST '26 17 18  ' ALT '16 10 11  ' ALKPHOS 49  --   --   BILITOT 1.4* 1.0 0.8  PROT 7.9 6.6 7.0  ALBUMIN 4.3  --   --    Recent Labs    10/08/17 2000  LIPASE 41   No results for input(s): AMMONIA in the last 8760 hours. CBC: Recent Labs    10/08/17 2000 05/18/18 0925  WBC 8.8 3.6*  NEUTROABS  --  1,850  HGB 14.6 14.5  HCT 42.7 43.1  MCV 86.6 85.7  PLT 178 204   Lipid Panel: Recent Labs    05/18/18 0925  CHOL 109  HDL 37*  LDLCALC 59  TRIG 51  CHOLHDL 2.9   TSH: Recent Labs    05/18/18 0925  TSH 1.72   A1C: Lab Results  Component Value Date   HGBA1C 7.3 (H) 05/18/2018     Assessment/Plan 1. Need for influenza vaccination - Flu Vaccine QUAD High Dose(Fluad)  2. Benign hypertensive heart disease without heart failure -stable at this time. Continues on irbestan 75 mg daily  - BMP with eGFR([facility]) - CBC with Differential/Platelet  3. DM type 2 with diabetic peripheral neuropathy (HCC) -will follow up A1c today. Continues  metformin 1000 mg by mouth twice daily. Encouraged dietary compliance, routine foot care/monitoring and to keep up with diabetic eye exams through ophthalmology  - BMP with eGFR([facility]) - CBC with Differential/Platelet -  Hemoglobin A1c  4. Hyperlipidemia LDL goal <70 Continues on zocor, encouraged diet modifications.  - Lipid panel  5. Gastroesophageal reflux disease without esophagitis Stable on protonix.   Next appt: 6 months with labs at appt.  Carlos American. [place], [facility] [phone]

## 2018-09-22 LAB — CBC WITH DIFFERENTIAL/PLATELET
Absolute Monocytes: 409 cells/uL (ref 200–950)
Basophils Absolute: 32 cells/uL (ref 0–200)
Basophils Relative: 0.7 %
Eosinophils Absolute: 51 cells/uL (ref 15–500)
Eosinophils Relative: 1.1 %
HCT: 43.6 % (ref 38.5–50.0)
Hemoglobin: 14.6 g/dL (ref 13.2–17.1)
Lymphs Abs: 1536 cells/uL (ref 850–3900)
MCH: 29 pg (ref 27.0–33.0)
MCHC: 33.5 g/dL (ref 32.0–36.0)
MCV: 86.7 fL (ref 80.0–100.0)
MPV: 10.4 fL (ref 7.5–12.5)
Monocytes Relative: 8.9 %
Neutro Abs: 2571 cells/uL (ref 1500–7800)
Neutrophils Relative %: 55.9 %
Platelets: 183 10*3/uL (ref 140–400)
RBC: 5.03 10*6/uL (ref 4.20–5.80)
RDW: 13.2 % (ref 11.0–15.0)
Total Lymphocyte: 33.4 %
WBC: 4.6 10*3/uL (ref 3.8–10.8)

## 2018-09-22 LAB — LIPID PANEL
Cholesterol: 137 mg/dL (ref <200)
HDL: 41 mg/dL (ref >=40)
LDL Cholesterol (Calc): 83 mg/dL (calc)
Non-HDL Cholesterol (Calc): 96 mg/dL (calc) (ref <130)
Total CHOL/HDL Ratio: 3.3 (calc) (ref <5.0)
Triglycerides: 57 mg/dL (ref <150)

## 2018-09-22 LAB — HEMOGLOBIN A1C
Hgb A1c MFr Bld: 7.4 % of total Hgb — ABNORMAL HIGH (ref <5.7)
Mean Plasma Glucose: 166 (calc)
eAG (mmol/L): 9.2 (calc)

## 2018-09-22 LAB — BASIC METABOLIC PANEL WITH GFR
BUN: 20 mg/dL (ref 7–25)
CO2: 26 mmol/L (ref 20–32)
Calcium: 9.5 mg/dL (ref 8.6–10.3)
Chloride: 104 mmol/L (ref 98–110)
Creat: 1.09 mg/dL (ref 0.70–1.18)
GFR, Est African American: 78 mL/min/{1.73_m2} (ref >=60)
GFR, Est Non African American: 67 mL/min/{1.73_m2} (ref >=60)
Glucose, Bld: 118 mg/dL — ABNORMAL HIGH (ref 65–99)
Potassium: 3.9 mmol/L (ref 3.5–5.3)
Sodium: 138 mmol/L (ref 135–146)

## 2018-09-25 ENCOUNTER — Other Ambulatory Visit: Payer: Self-pay

## 2018-09-25 ENCOUNTER — Telehealth: Payer: Self-pay

## 2018-09-25 DIAGNOSIS — E1142 Type 2 diabetes mellitus with diabetic polyneuropathy: Secondary | ICD-10-CM

## 2018-09-25 DIAGNOSIS — R7309 Other abnormal glucose: Secondary | ICD-10-CM

## 2018-09-25 MED ORDER — SITAGLIPTIN PHOSPHATE 100 MG PO TABS: 100.0000 mg | ORAL_TABLET | Freq: Every day | ORAL | 5 refills | Status: DC

## 2018-09-25 MED ORDER — SITAGLIPTIN PHOSPHATE 100 MG PO TABS: 100.0000 mg | ORAL_TABLET | Freq: Every day | ORAL | 1 refills | Status: DC

## 2018-09-25 NOTE — Telephone Encounter (Signed)
Patient called and stated he was unable to afford 90 day supply of Januvia medication at this time. Spoke with provider about situation and she stated to call [employer] and see if we could get a number to offer patient for patient assistance. [place] and spoke with Linna Hoff and he offered number.  Called patient back and advised he could get 30 day supply instead of 90 day supply at this time and we would be getting some samples here in office. Also supplied the patient with phone number to patient assistance. Patient was appreciative of information and stated he would call back

## 2018-09-25 NOTE — Addendum Note (Signed)
Addended by: Ruthell Rummage A on: 09/25/2018 02:03 PM   Modules accepted: Orders

## 2018-11-16 ENCOUNTER — Other Ambulatory Visit: Payer: Self-pay | Admitting: Family

## 2018-11-16 DIAGNOSIS — E1142 Type 2 diabetes mellitus with diabetic polyneuropathy: Secondary | ICD-10-CM

## 2018-11-17 ENCOUNTER — Other Ambulatory Visit: Payer: Self-pay | Admitting: Nurse Practitioner

## 2018-12-24 ENCOUNTER — Ambulatory Visit: Payer: Self-pay

## 2018-12-24 ENCOUNTER — Encounter: Payer: Self-pay | Admitting: Family

## 2018-12-25 ENCOUNTER — Other Ambulatory Visit: Payer: Self-pay

## 2018-12-25 ENCOUNTER — Encounter: Payer: Self-pay | Admitting: Family

## 2018-12-26 ENCOUNTER — Encounter: Payer: Self-pay | Admitting: Nurse Practitioner

## 2018-12-26 ENCOUNTER — Ambulatory Visit (INDEPENDENT_AMBULATORY_CARE_PROVIDER_SITE_OTHER): Payer: Medicare HMO | Admitting: Nurse Practitioner

## 2018-12-26 ENCOUNTER — Other Ambulatory Visit: Payer: Self-pay

## 2018-12-26 DIAGNOSIS — Z0000 Encounter for general adult medical examination without abnormal findings: Secondary | ICD-10-CM | POA: Diagnosis not present

## 2018-12-26 DIAGNOSIS — Z1159 Encounter for screening for other viral diseases: Secondary | ICD-10-CM

## 2018-12-26 DIAGNOSIS — K219 Gastro-esophageal reflux disease without esophagitis: Secondary | ICD-10-CM | POA: Diagnosis not present

## 2018-12-26 DIAGNOSIS — I10 Essential (primary) hypertension: Secondary | ICD-10-CM | POA: Diagnosis not present

## 2018-12-26 DIAGNOSIS — E1142 Type 2 diabetes mellitus with diabetic polyneuropathy: Secondary | ICD-10-CM

## 2018-12-26 DIAGNOSIS — M545 Low back pain: Secondary | ICD-10-CM

## 2018-12-26 DIAGNOSIS — E785 Hyperlipidemia, unspecified: Secondary | ICD-10-CM

## 2018-12-26 DIAGNOSIS — G8929 Other chronic pain: Secondary | ICD-10-CM | POA: Diagnosis not present

## 2018-12-26 MED ORDER — SITAGLIPTIN PHOSPHATE 100 MG PO TABS: 100.0000 mg | ORAL_TABLET | Freq: Every day | ORAL | 5 refills | Status: DC

## 2018-12-26 NOTE — Progress Notes (Signed)
This service is provided via telemedicine  No vital signs collected/recorded due to the encounter was a telemedicine visit.   Location of patient (ex: home, work): Home   Patient consents to a telephone visit:  Yes  Location of the provider (ex: office, home):  Office   Name of any referring provider: N/A  Names of all persons participating in the telemedicine service and their role in the encounter:  Sherrie Mustache, NP, Ruthell Rummage Cma and patient   Time spent on call:  Ruthell Rummage CMA, spent 11 minutes on phone with patient

## 2018-12-26 NOTE — Progress Notes (Signed)
This service is provided via telemedicine  No vital signs collected/recorded due to the encounter was a telemedicine visit.   Location of patient (ex: home, work): home   Patient consents to a telephone visit:  Yes  Location of the provider (ex: office, home): office  Name of any referring provider: N/a  Names of all persons participating in the telemedicine service and their role in the encounter:  Sherrie Mustache, NP, Ruthell Rummage CMA and patient   Time spent on call:  Ruthell Rummage CMA,      Careteam: Patient Care Team: Lauree Chandler, NP as PCP - General (Geriatric Medicine)  Advanced Directive information Does Patient Have a Medical Advance Directive?: No, Would patient like information on creating a medical advance directive?: No - Patient declined  Allergies  Allergen Reactions  . Lisinopril Itching    Chief Complaint  Patient presents with  . Medical Management of Chronic Issues    3 month follow up patient denies concerns      HPI: Patient is a 73 y.o. male seen in the office today for routine follow up.   Back pain- occasional flare, tylenol sometimes is effective. Drinks plenty of water which helps  Dm- out of [place], just ran out. Continue with metformin 1000 mg twice daily with meals. No hypoglycemia. Overall blood sugars better with current regimen.   Hyperlipidemia- taking zocor 10 mg daily. Trying to get more vegetables.   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   GERD- controlled on omeprazole, uses as needed    Hypertension- plans to get a cuff to take blood pressure at home but has not taken recently   Review of Systems:  Review of Systems  Constitutional: Negative for chills, fever and weight loss.  HENT: Negative for tinnitus.   Respiratory: Negative for cough, sputum production and shortness of breath.   Cardiovascular: Negative for chest pain, palpitations and leg swelling.  Gastrointestinal:  Negative for abdominal pain, constipation, diarrhea and heartburn.  Genitourinary: Negative for dysuria, frequency and urgency.  Musculoskeletal: Positive for back pain and myalgias. Negative for falls and joint pain.  Skin: Negative.   Neurological: Negative for dizziness and headaches.  Psychiatric/Behavioral: Negative for depression and memory loss. The patient does not have insomnia.     Past Medical History:  Diagnosis Date  . GERD (gastroesophageal reflux disease)   . Lumbago   . Memory loss   . Other and unspecified hyperlipidemia   . Type II or unspecified type diabetes mellitus without mention of complication, uncontrolled   . Unspecified arthropathy, shoulder region   . Unspecified essential hypertension   . Unspecified hereditary and idiopathic peripheral neuropathy   . Unspecified vitamin D deficiency    Past Surgical History:  Procedure Laterality Date  . COLONOSCOPY  2005   Dr Sharlotte Alamo Medoff  . COLONOSCOPY W/ POLYPECTOMY  2012   [facility]- sessile polyp in transverse colon 4 mm and another 6 mm  . INGUINAL HERNIA REPAIR  2019  . KIDNEY STONE SURGERY  2013   Dr Kathie Rhodes   Social History:   reports that he quit smoking about 45 years ago. He has a 11.00 pack-year smoking history. He has never used smokeless tobacco. He reports that he does not drink alcohol or use drugs.  Family History  Problem Relation Age of Onset  . Heart disease Mother   . Heart attack Father   . Cancer Sister        colon  .  Cancer Brother        colon  . Cancer Brother        colon  . Cancer Brother        prostate  . Cancer Brother        prostate  . Cancer Brother        Pancreas  . Stroke Other     Medications: Patient's Medications  New Prescriptions   No medications on file  Previous Medications   ACCU-CHEK SOFTCLIX LANCETS LANCETS    Use to test blood sugar twice daily. Dx: E11.42   ALCOHOL SWABS (B-D SINGLE USE SWABS REGULAR) PADS    USE DAILY AS DIRECTED  WHEN CHECKING BLOOD SUGAR   ASPIRIN 81 MG TABLET    Take 81 mg by mouth daily.   BLOOD GLUCOSE MONITORING SUPPL (ACCU-CHEK AVIVA PLUS) W/DEVICE KIT    Use to test blood sugar twice daily. Dx: E11.42   CALCIUM-VITAMIN D (OSCAL 500/200 D-3) 500-200 MG-UNIT PER TABLET    Take 1 tablet by mouth 2 (two) times daily.   GABAPENTIN (NEURONTIN) 100 MG CAPSULE    Take 1 capsule (100 mg total) by mouth at bedtime.   GLUCOSE BLOOD (ACCU-CHEK AVIVA PLUS) TEST STRIP    Use to check blood sugar twice daily. Dx: E11.42   IRBESARTAN (AVAPRO) 75 MG TABLET    Take 1 tablet (75 mg total) by mouth daily. Future refills should go to mail order   METFORMIN (GLUCOPHAGE) 1000 MG TABLET    TAKE 1 TABLET BY MOUTH TWICE A DAY FOR BLOOD SUGAR   OMEPRAZOLE (PRILOSEC OTC) 20 MG TABLET    Take 20 mg by mouth daily.   SIMVASTATIN (ZOCOR) 10 MG TABLET    TAKE 1 TABLET (10 MG TOTAL) BY MOUTH DAILY AT 6 PM.  Modified Medications   Modified Medication Previous Medication   SITAGLIPTIN (JANUVIA) 100 MG TABLET sitaGLIPtin (JANUVIA) 100 MG tablet      Take 1 tablet (100 mg total) by mouth daily.    Take 1 tablet (100 mg total) by mouth daily.  Discontinued Medications   No medications on file    Physical Exam:  There were no vitals filed for this visit. There is no height or weight on file to calculate BMI. Wt Readings from Last 3 Encounters:  09/21/18 168 lb (76.2 kg)  03/07/18 168 lb (76.2 kg)  03/01/18 174 lb 6.4 oz (79.1 kg)    Labs reviewed: Basic Metabolic Panel: Recent Labs    05/18/18 0925 09/21/18 1115  NA 141 138  K 4.1 3.9  CL 105 104  CO2 30 26  GLUCOSE 131* 118*  BUN 15 20  CREATININE 1.13 1.09  CALCIUM 9.3 9.5  TSH 1.72  --    Liver Function Tests: Recent Labs    05/18/18 0925  AST 18  ALT 11  BILITOT 0.8  PROT 7.0   No results for input(s): LIPASE, AMYLASE in the last 8760 hours. No results for input(s): AMMONIA in the last 8760 hours. CBC: Recent Labs    05/18/18 0925 09/21/18 1115   WBC 3.6* 4.6  NEUTROABS 1,850 2,571  HGB 14.5 14.6  HCT 43.1 43.6  MCV 85.7 86.7  PLT 204 183   Lipid Panel: Recent Labs    05/18/18 0925 09/21/18 1115  CHOL 109 137  HDL 37* 41  LDLCALC 59 83  TRIG 51 57  CHOLHDL 2.9 3.3   TSH: Recent Labs    05/18/18 0925  TSH 1.72  A1C: Lab Results  Component Value Date   HGBA1C 7.4 (H) 09/21/2018     Assessment/Plan 1. DM type 2 with diabetic peripheral neuropathy ([place]) -started on januvia and reports improvement in blood sugars. Continues on metformin twice daily with [place]. Encouraged dietary compliance, routine foot care/monitoring and to keep up with diabetic eye exams through ophthalmology  -follow up A1c  2. Hyperlipidemia LDL goal <70 -LDL goal <70, will follow up labs, encouraged dietary compliance and modifications. Continues on simvastatin 10 mg daily.  - CMP with eGFR([facility]); Future - Lipid panel; Future  3. Gastroesophageal reflux disease without esophagitis Controlled. Uses omeprazole PRN  4. Essential hypertension -generally controlled in office, does not take blood pressure at home but plans to start. Continue dash diet with irbesartan,  - CMP with eGFR([facility]); Future  5. Chronic low back pain without sciatica, unspecified back pain laterality -ongoing, tries to drink lots of water and stay active which generally controls pain. Tylenol helps as needed.   6. Need for hepatitis C screening test - Hepatitis C antibody; Future  Plans to come for labs within the next week Next appt: 4 month follow up [place]. Harle Battiest  [facility] [phone]    Virtual Visit via Telephone Note  I connected with@ on 12/26/18 at 10:00 AM EST by telephone and verified that I am speaking with the correct person using two identifiers.  Location: Patient: home Provider: office   I discussed the limitations, risks, security and privacy concerns of performing an evaluation and  management service by telephone and the availability of in person appointments. I also discussed with the patient that there may be a patient responsible charge related to this service. The patient expressed understanding and agreed to proceed.   I discussed the assessment and treatment plan with the patient. The patient was provided an opportunity to ask questions and all were answered. The patient agreed with the plan and demonstrated an understanding of the instructions.   The patient was advised to call back or seek an in-person evaluation if the symptoms worsen or if the condition fails to improve as anticipated.  I provided 21 minutes of non-face-to-face time during this encounter.  Carlos American. Harle Battiest Avs printed and mailed

## 2018-12-26 NOTE — Progress Notes (Signed)
Subjective:   Danny Winters is a 73 y.o. male who presents for Medicare Annual/Subsequent preventive examination.  Review of Systems:   Cardiac Risk Factors include: advanced age (>54mn, >>75women);diabetes mellitus;dyslipidemia;hypertension;male gender;obesity (BMI >30kg/m2)     Objective:    Vitals: There were no vitals taken for this visit.  There is no height or weight on file to calculate BMI.  Advanced Directives 12/26/2018 12/26/2018 12/18/2017 10/08/2017 08/12/2016 11/27/2015 06/19/2015  Does Patient Have a Medical Advance Directive? No No No No No No No  Would patient like information on creating a medical advance directive? No - Patient declined No - Patient declined Yes (MAU/Ambulatory/Procedural Areas - Information given) No - Patient declined Yes (MAU/Ambulatory/Procedural Areas - Information given) No - patient declined information No - patient declined information  Pre-existing out of facility DNR order (yellow form or pink MOST form) - - - - - - -    Tobacco Social History   Tobacco Use  Smoking Status Former Smoker   Packs/day: 1.00   Years: 11.00   Pack years: 11.00   Quit date: 07/01/1973   Years since quitting: 45.5  Smokeless Tobacco Never Used     Counseling given: Not Answered   Clinical Intake:  Pre-visit preparation completed: Yes  Pain : 0-10 Pain Score: 5  Pain Type: Chronic pain Pain Location: Back Pain Orientation: Lower Pain Descriptors / Indicators: Aching, Tender Pain Onset: More than a month ago Pain Frequency: Occasional Pain Relieving Factors: drinking water  Pain Relieving Factors: drinking water  BMI - recorded: 27.96 Nutritional Status: BMI 25 -29 Overweight Nutritional Risks: Other (Comment) Diabetes: No  How often do you need to have someone help you when you read instructions, pamphlets, or other written materials from your doctor or pharmacy?: 1 - Never What is the last grade level you completed in school?: high  school  Interpreter Needed?: No     Past Medical History:  Diagnosis Date   GERD (gastroesophageal reflux disease)    Lumbago    Memory loss    Other and unspecified hyperlipidemia    Type II or unspecified type diabetes mellitus without mention of complication, uncontrolled    Unspecified arthropathy, shoulder region    Unspecified essential hypertension    Unspecified hereditary and idiopathic peripheral neuropathy    Unspecified vitamin D deficiency    Past Surgical History:  Procedure Laterality Date   COLONOSCOPY  2005   Dr JSharlotte AlamoMedoff   COLONOSCOPY W/ POLYPECTOMY  2012   [facility]- sessile polyp in transverse colon 4 mm and another 6 mm   INGUINAL HERNIA REPAIR  2019   KIDNEY STONE SURGERY  2013   Dr MKathie Rhodes  Family History  Problem Relation Age of Onset   Heart disease Mother    Heart attack Father    Cancer Sister        colon   Cancer Brother        colon   Cancer Brother        colon   Cancer Brother        prostate   Cancer Brother        prostate   Cancer Brother        Pancreas   Stroke Other    Social History   Socioeconomic History   Marital status: Divorced    Spouse name: Not on file   Number of children: Not on file   Years of education: Not on file   Highest  education level: Not on file  Occupational History   Not on file  Social Needs   Financial resource strain: Not hard at all   Food insecurity    Worry: Never true    Inability: Never true   Transportation needs    Medical: No    Non-medical: No  Tobacco Use   Smoking status: Former Smoker    Packs/day: 1.00    Years: 11.00    Pack years: 11.00    Quit date: 07/01/1973    Years since quitting: 45.5   Smokeless tobacco: Never Used  Substance and Sexual Activity   Alcohol use: No   Drug use: No   Sexual activity: Not on file  Lifestyle   Physical activity    Days per week: 0 days    Minutes per session: 0 min    Stress: Only a little  Relationships   Social connections    Talks on phone: More than three times a week    Gets together: More than three times a week    Attends religious service: More than 4 times per year    Active member of club or organization: Yes    Attends meetings of clubs or organizations: More than 4 times per year    Relationship status: Divorced  Other Topics Concern   Not on file  Social History Narrative   Not on file    Outpatient Encounter Medications as of 12/26/2018  Medication Sig   ACCU-CHEK SOFTCLIX LANCETS lancets Use to test blood sugar twice daily. Dx: E11.42   Alcohol Swabs (B-D SINGLE USE SWABS REGULAR) PADS USE DAILY AS DIRECTED WHEN CHECKING BLOOD SUGAR   aspirin 81 MG tablet Take 81 mg by mouth daily.   Blood Glucose Monitoring Suppl (ACCU-CHEK AVIVA PLUS) w/Device KIT Use to test blood sugar twice daily. Dx: E11.42   calcium-vitamin D (OSCAL 500/200 D-3) 500-200 MG-UNIT per tablet Take 1 tablet by mouth 2 (two) times daily.   gabapentin (NEURONTIN) 100 MG capsule Take 1 capsule (100 mg total) by mouth at bedtime.   glucose blood (ACCU-CHEK AVIVA PLUS) test strip Use to check blood sugar twice daily. Dx: E11.42   irbesartan (AVAPRO) 75 MG tablet Take 1 tablet (75 mg total) by mouth daily. Future refills should go to mail order   metFORMIN (GLUCOPHAGE) 1000 MG tablet TAKE 1 TABLET BY MOUTH TWICE A DAY FOR BLOOD SUGAR   omeprazole (PRILOSEC OTC) 20 MG tablet Take 20 mg by mouth daily.   simvastatin (ZOCOR) 10 MG tablet TAKE 1 TABLET (10 MG TOTAL) BY MOUTH DAILY AT 6 PM.   sitaGLIPtin (JANUVIA) 100 MG tablet Take 1 tablet (100 mg total) by mouth daily.   [DISCONTINUED] sitaGLIPtin (JANUVIA) 100 MG tablet Take 1 tablet (100 mg total) by mouth daily.   No facility-administered encounter medications on file as of 12/26/2018.     Activities of Daily Living In your present state of health, do you have any difficulty performing the following  activities: 12/26/2018  Hearing? N  Vision? N  Difficulty concentrating or making decisions? N  Walking or climbing stairs? N  Dressing or bathing? N  Doing errands, shopping? N  Preparing Food and eating ? N  Using the Toilet? N  In the past six months, have you accidently leaked urine? Y  Do you have problems with loss of bowel control? N  Managing your Medications? N  Managing your Finances? N  Housekeeping or managing your Housekeeping? N  Some recent data  might be hidden    Patient Care Team: Lauree Chandler, NP as PCP - General (Geriatric Medicine)   Assessment:   This is a routine wellness examination for Isay.  Exercise Activities and Dietary recommendations Current Exercise Habits: The patient does not participate in regular exercise at present  Goals     Exercise 2x per week (30 min per time)     Pt will walk more       Fall Risk Fall Risk  12/26/2018 05/15/2018 03/07/2018 03/01/2018 12/18/2017  Falls in the past year? 0 0 0 0 0  Comment - - - - -  Number falls in past yr: 0 0 0 0 -  Comment - - - - -  Injury with Fall? 0 0 0 0 -   Is the patient's home free of loose throw rugs in walkways, pet beds, electrical cords, etc?   yes      Grab bars in the bathroom? no      Handrails on the stairs?   no stairs      Adequate lighting?   yes  Timed Get Up and Go Performed: na  Depression Screen PHQ 2/9 Scores 12/26/2018 12/18/2017 12/18/2017 08/12/2016  PHQ - 2 Score 0 0 0 0    Cognitive Function MMSE - Mini Mental State Exam 12/18/2017 08/12/2016 07/17/2013  Orientation to time '4 5 5  ' Orientation to Place '4 5 5  ' Registration '3 3 3  ' Attention/ Calculation '5 5 4  ' Recall 2 0 1  Language- name 2 objects '2 2 2  ' Language- repeat '1 1 1  ' Language- follow 3 step command '3 2 3  ' Language- read & follow direction '1 1 1  ' Write a sentence '1 1 1  ' Copy design '1 1 1  ' Total score '27 26 27     ' 6CIT Screen 12/26/2018  What Year? 0 points  What month? 0 points  What  time? 0 points  Count back from 20 0 points  Months in reverse 0 points  Repeat phrase 4 points  Total Score 4    Immunization History  Administered Date(s) Administered   Fluad Quad(high Dose 65+) 09/21/2018   Influenza, High Dose Seasonal PF 12/18/2017   Influenza,inj,Quad PF,6+ Mos 11/13/2012, 11/20/2013, 01/23/2015, 11/27/2015, 04/18/2017   Influenza-Unspecified 11/10/2011   Pneumococcal Conjugate-13 04/08/2014   Pneumococcal Polysaccharide-23 10/01/2008, 11/13/2012   Tdap 01/24/2010    Qualifies for Shingles Vaccine? yes  Screening Tests Health Maintenance  Topic Date Due   FOOT EXAM  12/19/2018   OPHTHALMOLOGY EXAM  01/26/2019   HEMOGLOBIN A1C  03/24/2019   TETANUS/TDAP  01/25/2020   COLONOSCOPY  12/13/2020   INFLUENZA VACCINE  Completed   PNA vac Low Risk Adult  Completed   Hepatitis C Screening  Discontinued   Cancer Screenings: Lung: Low Dose CT Chest recommended if Age 51-80 years, 30 pack-year currently smoking OR have quit w/in 15years. Patient does not qualify. Colorectal: up to date  Additional Screenings:  Hepatitis C Screening:none      Plan:     I have personally reviewed and noted the following in the patients chart:    Medical and social history  Use of alcohol, tobacco or illicit drugs   Current medications and supplements  Functional ability and status  Nutritional status  Physical activity  Advanced directives  List of other physicians  Hospitalizations, surgeries, and ER visits in previous 12 months  Vitals  Screenings to include cognitive, depression, and falls  Referrals and appointments  In addition, I have reviewed and discussed with patient certain preventive protocols, quality metrics, and best practice recommendations. A written personalized care plan for preventive services as well as general preventive health recommendations were provided to patient.     Lauree Chandler, NP  12/26/2018

## 2018-12-26 NOTE — Patient Instructions (Addendum)
Danny Winters , Thank you for taking time to come for your Medicare Wellness Visit. I appreciate your ongoing commitment to your health goals. Please review the following plan we discussed and let me know if I can assist you in the future.   Screening recommendations/referrals: Colonoscopy up to date  Recommended yearly ophthalmology/optometry visit for glaucoma screening and checkup Recommended yearly dental visit for hygiene and checkup  Vaccinations: Influenza vaccine up to date  Pneumococcal vaccine up to date Tdap vaccine up to date Shingles vaccine to get at your local pharmacy     Advanced directives: to complete paperwork and bring back to office to place on file once notarized   Conditions/risks identified: to increase physical activity to help with back pain and over all health.   Next appointment: 1 year  Preventive Care 74 Years and Older, Male Preventive care refers to lifestyle choices and visits with your health care provider that can promote health and wellness. What does preventive care include?  A yearly physical exam. This is also called an annual well check.  Dental exams once or twice a year.  Routine eye exams. Ask your health care provider how often you should have your eyes checked.  Personal lifestyle choices, including:  Daily care of your teeth and gums.  Regular physical activity.  Eating a healthy diet.  Avoiding tobacco and drug use.  Limiting alcohol use.  Practicing safe sex.  Taking low doses of aspirin every day.  Taking vitamin and mineral supplements as recommended by your health care provider. What happens during an annual well check? The services and screenings done by your health care provider during your annual well check will depend on your age, overall health, lifestyle risk factors, and family history of disease. Counseling  Your health care provider may ask you questions about your:  Alcohol use.  Tobacco use.  Drug use.   Emotional well-being.  Home and relationship well-being.  Sexual activity.  Eating habits.  History of falls.  Memory and ability to understand (cognition).  Work and work Astronomer. Screening  You may have the following tests or measurements:  Height, weight, and BMI.  Blood pressure.  Lipid and cholesterol levels. These may be checked every 5 years, or more frequently if you are over 3 years old.  Skin check.  Lung cancer screening. You may have this screening every year starting at age 66 if you have a 30-pack-year history of smoking and currently smoke or have quit within the past 74 years.  Fecal occult blood test (FOBT) of the stool. You may have this test every year starting at age 74.  Flexible sigmoidoscopy or colonoscopy. You may have a sigmoidoscopy every 5 years or a colonoscopy every 10 years starting at age 74.  Prostate cancer screening. Recommendations will vary depending on your family history and other risks.  Hepatitis C blood test.  Hepatitis B blood test.  Sexually transmitted disease (STD) testing.  Diabetes screening. This is done by checking your blood sugar (glucose) after you have not eaten for a while (fasting). You may have this done every 1-3 years.  Abdominal aortic aneurysm (AAA) screening. You may need this if you are a current or former smoker.  Osteoporosis. You may be screened starting at age 74 if you are at high risk. Talk with your health care provider about your test results, treatment options, and if necessary, the need for more tests. Vaccines  Your health care provider may recommend certain vaccines, such as:  Influenza vaccine. This is recommended every year.  Tetanus, diphtheria, and acellular pertussis (Tdap, Td) vaccine. You may need a Td booster every 10 years.  Zoster vaccine. You may need this after age 40.  Pneumococcal 13-valent conjugate (PCV13) vaccine. One dose is recommended after age 74.  Pneumococcal  polysaccharide (PPSV23) vaccine. One dose is recommended after age 74. Talk to your health care provider about which screenings and vaccines you need and how often you need them. This information is not intended to replace advice given to you by your health care provider. Make sure you discuss any questions you have with your health care provider. Document Released: 02/06/2015 Document Revised: 09/30/2015 Document Reviewed: 11/11/2014 Elsevier Interactive Patient Education  2017 [place] Prevention in the Home Falls can cause injuries. They can happen to people of all ages. There are many things you can do to make your home safe and to help prevent falls. What can I do on the outside of my home?  Regularly fix the edges of walkways and driveways and fix any cracks.  Remove anything that might make you trip as you walk through a door, such as a raised step or threshold.  Trim any bushes or trees on the path to your home.  Use bright outdoor lighting.  Clear any walking paths of anything that might make someone trip, such as rocks or tools.  Regularly check to see if handrails are loose or broken. Make sure that both sides of any steps have handrails.  Any raised decks and porches should have guardrails on the edges.  Have any leaves, snow, or ice cleared regularly.  Use sand or salt on walking paths during winter.  Clean up any spills in your garage right away. This includes oil or grease spills. What can I do in the bathroom?  Use night lights.  Install grab bars by the toilet and in the tub and shower. Do not use towel bars as grab bars.  Use non-skid mats or decals in the tub or shower.  If you need to sit down in the shower, use a plastic, non-slip stool.  Keep the floor dry. Clean up any water that spills on the floor as soon as it happens.  Remove soap buildup in the tub or shower regularly.  Attach bath mats securely with double-sided non-slip rug tape.   Do not have throw rugs and other things on the floor that can make you trip. What can I do in the bedroom?  Use night lights.  Make sure that you have a light by your bed that is easy to reach.  Do not use any sheets or blankets that are too big for your bed. They should not hang down onto the floor.  Have a firm chair that has side arms. You can use this for support while you get dressed.  Do not have throw rugs and other things on the floor that can make you trip. What can I do in the kitchen?  Clean up any spills right away.  Avoid walking on wet floors.  Keep items that you use a lot in easy-to-reach places.  If you need to reach something above you, use a strong step stool that has a grab bar.  Keep electrical cords out of the way.  Do not use floor polish or wax that makes floors slippery. If you must use wax, use non-skid floor wax.  Do not have throw rugs and other things on the floor that  can make you trip. What can I do with my stairs?  Do not leave any items on the stairs.  Make sure that there are handrails on both sides of the stairs and use them. Fix handrails that are broken or loose. Make sure that handrails are as long as the stairways.  Check any carpeting to make sure that it is firmly attached to the stairs. Fix any carpet that is loose or worn.  Avoid having throw rugs at the top or bottom of the stairs. If you do have throw rugs, attach them to the floor with carpet tape.  Make sure that you have a light switch at the top of the stairs and the bottom of the stairs. If you do not have them, ask someone to add them for you. What else can I do to help prevent falls?  Wear shoes that:  Do not have high heels.  Have rubber bottoms.  Are comfortable and fit you well.  Are closed at the toe. Do not wear sandals.  If you use a stepladder:  Make sure that it is fully opened. Do not climb a closed stepladder.  Make sure that both sides of the  stepladder are locked into place.  Ask someone to hold it for you, if possible.  Clearly mark and make sure that you can see:  Any grab bars or handrails.  First and last steps.  Where the edge of each step is.  Use tools that help you move around (mobility aids) if they are needed. These include:  Canes.  Walkers.  Scooters.  Crutches.  Turn on the lights when you go into a dark area. Replace any light bulbs as soon as they burn out.  Set up your furniture so you have a clear path. Avoid moving your furniture around.  If any of your floors are uneven, fix them.  If there are any pets around you, be aware of where they are.  Review your medicines with your doctor. Some medicines can make you feel dizzy. This can increase your chance of falling. Ask your doctor what other things that you can do to help prevent falls. This information is not intended to replace advice given to you by your health care provider. Make sure you discuss any questions you have with your health care provider. Document Released: 11/06/2008 Document Revised: 06/18/2015 Document Reviewed: 02/14/2014 Elsevier Interactive Patient Education  2017 [employer].

## 2018-12-27 ENCOUNTER — Ambulatory Visit: Payer: Self-pay | Admitting: Nurse Practitioner

## 2018-12-28 ENCOUNTER — Other Ambulatory Visit: Payer: Medicare HMO

## 2018-12-28 ENCOUNTER — Other Ambulatory Visit: Payer: Self-pay

## 2018-12-28 DIAGNOSIS — E785 Hyperlipidemia, unspecified: Secondary | ICD-10-CM

## 2018-12-28 DIAGNOSIS — Z1159 Encounter for screening for other viral diseases: Secondary | ICD-10-CM | POA: Diagnosis not present

## 2018-12-28 DIAGNOSIS — R7309 Other abnormal glucose: Secondary | ICD-10-CM

## 2018-12-28 DIAGNOSIS — E1142 Type 2 diabetes mellitus with diabetic polyneuropathy: Secondary | ICD-10-CM | POA: Diagnosis not present

## 2018-12-28 DIAGNOSIS — I10 Essential (primary) hypertension: Secondary | ICD-10-CM

## 2018-12-31 LAB — LIPID PANEL
Cholesterol: 125 mg/dL (ref <200)
HDL: 39 mg/dL — ABNORMAL LOW (ref >=40)
LDL Cholesterol (Calc): 73 mg/dL (calc)
Non-HDL Cholesterol (Calc): 86 mg/dL (calc) (ref <130)
Total CHOL/HDL Ratio: 3.2 (calc) (ref <5.0)
Triglycerides: 46 mg/dL (ref <150)

## 2018-12-31 LAB — COMPLETE METABOLIC PANEL WITH GFR
AG Ratio: 1.5 (calc) (ref 1.0–2.5)
ALT: 9 U/L (ref 9–46)
AST: 21 U/L (ref 10–35)
Albumin: 4.3 g/dL (ref 3.6–5.1)
Alkaline phosphatase (APISO): 53 U/L (ref 35–144)
BUN: 18 mg/dL (ref 7–25)
CO2: 22 mmol/L (ref 20–32)
Calcium: 9.6 mg/dL (ref 8.6–10.3)
Chloride: 103 mmol/L (ref 98–110)
Creat: 1.18 mg/dL (ref 0.70–1.18)
GFR, Est African American: 71 mL/min/{1.73_m2} (ref >=60)
GFR, Est Non African American: 61 mL/min/{1.73_m2} (ref >=60)
Globulin: 2.9 g/dL (calc) (ref 1.9–3.7)
Glucose, Bld: 104 mg/dL — ABNORMAL HIGH (ref 65–99)
Potassium: 3.9 mmol/L (ref 3.5–5.3)
Sodium: 139 mmol/L (ref 135–146)
Total Bilirubin: 0.9 mg/dL (ref 0.2–1.2)
Total Protein: 7.2 g/dL (ref 6.1–8.1)

## 2018-12-31 LAB — HEPATITIS C ANTIBODY
Hepatitis C Ab: NONREACTIVE
SIGNAL TO CUT-OFF: 0.02 (ref <1.00)

## 2018-12-31 LAB — HEMOGLOBIN A1C
Hgb A1c MFr Bld: 7 % of total Hgb — ABNORMAL HIGH (ref <5.7)
Mean Plasma Glucose: 154 (calc)
eAG (mmol/L): 8.5 (calc)

## 2019-01-03 ENCOUNTER — Other Ambulatory Visit: Payer: Self-pay | Admitting: Internal Medicine

## 2019-02-15 ENCOUNTER — Other Ambulatory Visit: Payer: Self-pay | Admitting: Nurse Practitioner

## 2019-02-15 DIAGNOSIS — E785 Hyperlipidemia, unspecified: Secondary | ICD-10-CM

## 2019-02-15 NOTE — Telephone Encounter (Signed)
rx sent to pharmacy by e-script  

## 2019-03-22 ENCOUNTER — Ambulatory Visit: Payer: Medicare HMO | Admitting: Nurse Practitioner

## 2019-04-29 ENCOUNTER — Ambulatory Visit: Payer: Medicare HMO | Admitting: Nurse Practitioner

## 2019-04-30 ENCOUNTER — Encounter: Payer: Self-pay | Admitting: Family

## 2019-04-30 ENCOUNTER — Ambulatory Visit (INDEPENDENT_AMBULATORY_CARE_PROVIDER_SITE_OTHER): Payer: Medicare HMO | Admitting: Family

## 2019-04-30 ENCOUNTER — Other Ambulatory Visit: Payer: Self-pay

## 2019-04-30 VITALS — BP 150/90 | HR 74 | Temp 97.5°F | Ht 65.0 in | Wt 175.0 lb

## 2019-04-30 DIAGNOSIS — E1142 Type 2 diabetes mellitus with diabetic polyneuropathy: Secondary | ICD-10-CM

## 2019-04-30 DIAGNOSIS — H6121 Impacted cerumen, right ear: Secondary | ICD-10-CM | POA: Diagnosis not present

## 2019-04-30 DIAGNOSIS — R0981 Nasal congestion: Secondary | ICD-10-CM

## 2019-04-30 DIAGNOSIS — K219 Gastro-esophageal reflux disease without esophagitis: Secondary | ICD-10-CM | POA: Diagnosis not present

## 2019-04-30 DIAGNOSIS — I10 Essential (primary) hypertension: Secondary | ICD-10-CM

## 2019-04-30 DIAGNOSIS — L609 Nail disorder, unspecified: Secondary | ICD-10-CM | POA: Diagnosis not present

## 2019-04-30 DIAGNOSIS — E785 Hyperlipidemia, unspecified: Secondary | ICD-10-CM

## 2019-04-30 DIAGNOSIS — M545 Low back pain: Secondary | ICD-10-CM

## 2019-04-30 DIAGNOSIS — G8929 Other chronic pain: Secondary | ICD-10-CM

## 2019-04-30 MED ORDER — FLUTICASONE PROPIONATE 50 MCG/ACT NA SUSP: 2.0000 | Freq: Every day | NASAL | 6 refills | Status: DC

## 2019-04-30 MED ORDER — ACETAMINOPHEN 500 MG PO TABS: 1000.0000 mg | ORAL_TABLET | Freq: Two times a day (BID) | ORAL | 0 refills | Status: DC

## 2019-04-30 NOTE — Progress Notes (Signed)
Provider: Sherly Brodbeck FNP-C   Lauree Chandler, NP  Patient Care Team: Lauree Chandler, NP as PCP - General (Geriatric Medicine)  Extended Emergency Contact Information Primary Emergency Contact: Mcclurg,Catherine Address: [address], [place] Phone: [phone] Relation: Spouse Secondary Emergency Contact: Farris [place] Phone: [phone] Relation: Daughter  Code Status:  Full Code  Goals of care: Advanced Directive information Advanced Directives 12/26/2018  Does Patient Have a Medical Advance Directive? No  Would patient like information on creating a medical advance directive? No - Patient declined  Pre-existing out of facility DNR order (yellow form or pink MOST form) -     Chief Complaint  Patient presents with  . Medical Management of Chronic Issues    [facility] follow-up    HPI:  Pt is a 74y.o. male seen today for 4 months follow up for medical management of chronic diseases.He denies any acute issues this visit though states has been more stressed due to taking care of X-wife who has dementia.Has daughters trying to arrange for home assistance for her though still feels stressed seeing her condition.He denies feeling depressed.states one of the daughter is a [employer] banker No home blood pressure readings for review.He adds extra salt to food and eats mostly fried foods like chicken.He is trying to add veggies in his diet.on irbesartan 75 mg tablet daily.denies any signs of hypotension.   Hyperlipidemia - latest LDL reviewed 73 with normal cholesterol and TRG.diet as above.States [place] growing up eating fried foods.Walks twice daily around the neigh[place].on simvastatin 10 mg tablet daily.  Type 2 DM - No home blood sugar log brought to visit.Recalls CBg readings in the 80's -130's with few readings in the 170's whenever he eats foods that are fried.He drinks unsweetened tea and  coffee.Not a lot of water.On Metformin 1000 mg tablet twice daily and Januvia 100 mg tablet daily.He is due for annual diabetic eye and foot exam.    Lower back pain - states pain worst sometimes with walking 10/10 on scale sometimes.On Gabapentin 100 mg Capsule daily.He request mobic for refill states helps with the pain though mobic not on current medication list.Discussed prolong use of NSAID's with high risk for GI bleed.He verbalized understanding.   GERD - states symptoms worst with fried foods but Omeprazole 20 mg tablet has been effective. Denies any signs of blood in the stool. Latest H/H on chart review stable.        No fall epiosode.  Past Medical History:  Diagnosis Date  . GERD (gastroesophageal reflux disease)   . Lumbago   . Memory loss   . Other and unspecified hyperlipidemia   . Type II or unspecified type diabetes mellitus without mention of complication, uncontrolled   . Unspecified arthropathy, shoulder region   . Unspecified essential hypertension   . Unspecified hereditary and idiopathic peripheral neuropathy   . Unspecified vitamin D deficiency    Past Surgical History:  Procedure Laterality Date  . COLONOSCOPY  2005   Dr JSharlotte AlamoMedoff  . COLONOSCOPY W/ POLYPECTOMY  2012   [facility]- sessile polyp in transverse colon 4 mm and another 6 mm  . INGUINAL HERNIA REPAIR  2019  . KIDNEY STONE SURGERY  2013   Dr MKathie Rhodes   Allergies  Allergen Reactions  . Lisinopril Itching    Allergies as of 04/30/2019  Reactions   Lisinopril Itching      Medication List       Accurate as of April 30, 2019  9:26 AM. If you have any questions, ask your nurse or doctor.        STOP taking these medications   Accu-Chek Aviva Plus w/Device Kit Stopped by: Nelda Bucks Devyon Keator, NP     TAKE these medications   Accu-Chek Aviva Plus test strip Generic drug: glucose blood USE TO CHECK BLOOD SUGAR TWICE DAILY.   Accu-Chek Softclix Lancets lancets USE TO TEST  BLOOD SUGAR TWICE DAILY.   aspirin 81 MG tablet Take 81 mg by mouth daily.   B-D SINGLE USE SWABS REGULAR Pads USE DAILY AS DIRECTED WHEN CHECKING BLOOD SUGAR   calcium-vitamin D 500-200 MG-UNIT tablet Commonly known as: Oscal 500/200 D-3 Take 1 tablet by mouth 2 (two) times daily.   gabapentin 100 MG capsule Commonly known as: NEURONTIN Take 1 capsule (100 mg total) by mouth at bedtime.   irbesartan 75 MG tablet Commonly known as: AVAPRO Take 1 tablet (75 mg total) by mouth daily. Future refills should go to mail order   metFORMIN 1000 MG tablet Commonly known as: GLUCOPHAGE TAKE 1 TABLET BY MOUTH TWICE A DAY FOR BLOOD SUGAR   omeprazole 20 MG tablet Commonly known as: PRILOSEC OTC Take 20 mg by mouth daily.   simvastatin 10 MG tablet Commonly known as: ZOCOR TAKE 1 TABLET (10 MG TOTAL) BY MOUTH DAILY AT 6 PM.   sitaGLIPtin 100 MG tablet Commonly known as: JANUVIA Take 1 tablet (100 mg total) by mouth daily.       Review of Systems  Constitutional: Negative for appetite change, chills, fatigue and fever.  HENT: Positive for congestion, postnasal drip and rhinorrhea. Negative for hearing loss, sinus pressure, sinus pain, sneezing, sore throat and trouble swallowing.   Eyes: Negative for discharge, redness, itching and visual disturbance.       Cataract saw Ophthalmology Dr.shapiro   Respiratory: Negative for cough, chest tightness, shortness of breath and wheezing.   Cardiovascular: Negative for chest pain, palpitations and leg swelling.  Gastrointestinal: Negative for abdominal distention, abdominal pain, constipation, diarrhea, nausea and vomiting.  Endocrine: Negative for cold intolerance, heat intolerance, polydipsia, polyphagia and polyuria.  Genitourinary: Negative for difficulty urinating, dysuria, flank pain, frequency, hematuria and urgency.  Musculoskeletal: Positive for arthralgias and back pain. Negative for gait problem, joint swelling, myalgias, neck  pain and neck stiffness.  Skin: Negative for color change, pallor and rash.  Neurological: Negative for dizziness, speech difficulty, weakness, light-headedness and headaches.  Hematological: Does not bruise/bleed easily.  Psychiatric/Behavioral: Negative for agitation, confusion and sleep disturbance. The patient is not nervous/anxious.     Immunization History  Administered Date(s) Administered  . Fluad Quad(high Dose 65+) 09/21/2018  . Influenza, High Dose Seasonal PF 12/18/2017  . Influenza,inj,Quad PF,6+ Mos 11/13/2012, 11/20/2013, 01/23/2015, 11/27/2015, 04/18/2017  . Influenza-Unspecified 11/10/2011  . Pneumococcal Conjugate-13 04/08/2014  . Pneumococcal Polysaccharide-23 10/01/2008, 11/13/2012  . Tdap 01/24/2010   Pertinent  Health Maintenance Due  Topic Date Due  . FOOT EXAM  12/19/2018  . OPHTHALMOLOGY EXAM  01/26/2019  . HEMOGLOBIN A1C  06/28/2019  . INFLUENZA VACCINE  08/25/2019  . COLONOSCOPY  12/13/2020  . PNA vac Low Risk Adult  Completed   Fall Risk  04/30/2019 12/26/2018 05/15/2018 03/07/2018 03/01/2018  Falls in the past year? 0 0 0 0 0  Comment - - - - -  Number falls in past yr: 0 0  0 0 0  Comment - - - - -  Injury with Fall? 0 0 0 0 0    Vitals:   04/30/19 0920  BP: (!) 150/90  Pulse: 74  Temp: (!) 97.5 F (36.4 C)  TempSrc: Oral  SpO2: 98%  Weight: 175 lb (79.4 kg)  Height: '5\' 5"'  (1.651 m)   Body mass index is 29.12 kg/m. Physical Exam Vitals reviewed.  Constitutional:      General: He is not in acute distress.    Appearance: He is overweight.  HENT:     Head: Normocephalic.     Right Ear: There is impacted cerumen.     Left Ear: Tympanic membrane, ear canal and external ear normal. There is no impacted cerumen.     Ears:     Comments: Right ear cerumen impaction lavaged with warm water and hydrogen peroxide.Moderates amounts of cerumen obtained.patient tolerated procedure well.No instrument used.TM clear no signs of infections noted.    Nose:  Congestion and rhinorrhea present. No nasal deformity, septal deviation or nasal tenderness.     Right Turbinates: Swollen. Not pale.     Left Turbinates: Swollen. Not pale.     Right Sinus: No maxillary sinus tenderness or frontal sinus tenderness.     Left Sinus: No maxillary sinus tenderness or frontal sinus tenderness.     Mouth/Throat:     Mouth: Mucous membranes are moist.     Pharynx: Oropharynx is clear. No oropharyngeal exudate or posterior oropharyngeal erythema.  Eyes:     General: No scleral icterus.       Right eye: No discharge.        Left eye: No discharge.     Extraocular Movements: Extraocular movements intact.     Conjunctiva/sclera: Conjunctivae normal.     Pupils: Pupils are equal, round, and reactive to light.  Neck:     Vascular: No carotid bruit.  Cardiovascular:     Rate and Rhythm: Normal rate and regular rhythm.     Pulses: Normal pulses.     Heart sounds: Normal heart sounds. No murmur. No friction rub. No gallop.   Pulmonary:     Effort: Pulmonary effort is normal. No respiratory distress.     Breath sounds: Normal breath sounds. No wheezing, rhonchi or rales.  Chest:     Chest wall: No tenderness.  Abdominal:     General: Bowel sounds are normal. There is no distension.     Palpations: Abdomen is soft. There is no mass.     Tenderness: There is no abdominal tenderness. There is no right CVA tenderness, left CVA tenderness, guarding or rebound.  Musculoskeletal:        General: No swelling. Normal range of motion.     Cervical back: Normal range of motion. No rigidity or tenderness.     Lumbar back: Tenderness present. No swelling, edema or spasms. Normal range of motion. Negative right straight leg raise test and negative left straight leg raise test. No scoliosis.       Back:     Right lower leg: No edema.     Left lower leg: No edema.       Feet:  Feet:     Right foot:     Skin integrity: Skin integrity normal.     Left foot:     Skin  integrity: Skin integrity normal.  Lymphadenopathy:     Cervical: No cervical adenopathy.  Skin:    General: Skin is warm and dry.  Coloration: Skin is not pale.     Findings: No bruising, erythema or rash.  Neurological:     Mental Status: He is alert and oriented to person, place, and time.     Cranial Nerves: No cranial nerve deficit.     Sensory: No sensory deficit.     Motor: No weakness.     Coordination: Coordination normal.     Gait: Gait normal.  Psychiatric:        Mood and Affect: Mood normal.        Behavior: Behavior normal.        Thought Content: Thought content normal.        Judgment: Judgment normal.     Labs reviewed: Recent Labs    05/18/18 0925 09/21/18 1115 12/28/18 0812  NA 141 138 139  K 4.1 3.9 3.9  CL 105 104 103  CO2 '30 26 22  ' GLUCOSE 131* 118* 104*  BUN '15 20 18  ' CREATININE 1.13 1.09 1.18  CALCIUM 9.3 9.5 9.6   Recent Labs    05/18/18 0925 12/28/18 0812  AST 18 21  ALT 11 9  BILITOT 0.8 0.9  PROT 7.0 7.2   Recent Labs    05/18/18 0925 09/21/18 1115  WBC 3.6* 4.6  NEUTROABS 1,850 2,571  HGB 14.5 14.6  HCT 43.1 43.6  MCV 85.7 86.7  PLT 204 183   Lab Results  Component Value Date   TSH 1.72 05/18/2018   Lab Results  Component Value Date   HGBA1C 7.0 (H) 12/28/2018   Lab Results  Component Value Date   CHOL 125 12/28/2018   HDL 39 (L) 12/28/2018   LDLCALC 73 12/28/2018   TRIG 46 12/28/2018   CHOLHDL 3.2 12/28/2018    Significant Diagnostic Results in last 30 days:  No results found.  Assessment/Plan  1. Essential hypertension B/p elevated today attributes to increased stress level caring for X-wife with dementia. - Advised to check blood pressure at home and notify provider if > 140/90  - Avoid adding extra salt to diet and eating fried foods. - Increase water intake to 6-8 glasses of water daily. - continue on  irbesartan 75 mg tablet daily.On ASA and Statin for CV event prevention.    - CBC with  Differential/Platelets - CMP with eGFR([facility])  2. DM type 2 with diabetic peripheral neuropathy (HCC) Lab Results  Component Value Date   HGBA1C 7.0 (H) 12/28/2018  No reports of hypoglycemia. - continue on Metformin 1000 mg tablet twice daily and Januvia 100 mg tablet daily - Ambulatory referral to Podiatry for curled toenails and annual diabetic foot exam. - Ambulatory referral to Ophthalmology for annual diabetic eye exam.  - TSH - Hemoglobin A1c  3. Hyperlipidemia LDL goal <70 Not at goal LDL 73  - continue on on simvastatin 10 mg tablet daily - advised low carbohydrates,low saturated fats and high vegetables diet.  - Lipid Panel  4. Gastroesophageal reflux disease without esophagitis Symptoms controlled on omeprazole 20 mg tablet daily. -H/H stable.   - CBC with Differential/Platelets  5. Chronic low back pain without sciatica, unspecified back pain laterality Chronic lower back pain tenderness.Negative SLR.  - Continue on Gabapentin 100 mg capsule daily at bedtime. Request refill for mobic.NSAID's use discussed with patient due to his advance age and high risk for upper GI bleed.verbalized understanding.Will take Tylenol as below. - acetaminophen (TYLENOL) 500 MG tablet; Take 2 tablets (1,000 mg total) by mouth in the morning and at bedtime. Take 500  mg tablet daily at 2 Pm.  Dispense: 30 tablet; Refill: 0  6. Impacted cerumen of right ear Ear lavaged with moderate amounts of cerumen obtained.TM clear no signs of infection.No instrument used to remove cerumen.   7. Nasal congestion Inferior turbinates enlarged/swollen.clear nasal drainage.No sinus tenderness.Reports chronic allergies in the family.  Flonase ordered for nasal congestion and PND.encouraged to take OTC loratadine 10 mg tablet daily as needed for allergies during the spring season.  - fluticasone (FLONASE) 50 MCG/ACT nasal spray; Place 2 sprays into both nostrils daily.  Dispense: 16 g; Refill: 6  - additional  nasal congestion information provided on AVS.  Family/ staff Communication: Reviewed plan of care with patient verbalized understanding.   Labs/tests ordered:  - CBC with Differential/Platelets - CMP with eGFR([facility]) - Lipid Panel - TSH - Hemoglobin A1c  Next Appointment : 6 months for medical management of chronic issuses.   Sandrea Hughs, NP

## 2019-04-30 NOTE — Patient Instructions (Addendum)
-   use Flonase nasal spray 2 sprays in each nares daily for nasal congestion and drainage.  - Lab work done today will call you with results.  - Advised to check blood pressure at home and notify provider if > 140/90  - Avoid adding extra salt to diet and eating fried foods. - Increase water intake to 6-8 glasses of water daily.  Postnasal Drip Postnasal drip is the feeling of mucus going down the back of your throat. Mucus is a slimy substance that moistens and cleans your nose and throat, as well as the air pockets in face bones near your forehead and cheeks (sinuses). Small amounts of mucus pass from your nose and sinuses down the back of your throat all the time. This is normal. When you produce too much mucus or the mucus gets too thick, you can feel it. Some common causes of postnasal drip include:  Having more mucus because of: ? A cold or the flu. ? Allergies. ? Cold air. ? Certain medicines.  Having more mucus that is thicker because of: ? A sinus or nasal infection. ? Dry air. ? A food allergy. Follow these instructions at home: Relieving discomfort   Gargle with a salt-water mixture 3-4 times a day or as needed. To make a salt-water mixture, completely dissolve -1 tsp of salt in 1 cup of warm water.  If the air in your home is dry, use a humidifier to add moisture to the air.  Use a saline spray or container (neti pot) to flush out the nose (nasal irrigation). These methods can help clear away mucus and keep the nasal passages moist. General instructions  Take over-the-counter and prescription medicines only as told by your health care provider.  Follow instructions from your health care provider about eating or drinking restrictions. You may need to avoid caffeine.  Avoid things that you know you are allergic to (allergens), like dust, mold, pollen, pets, or certain foods.  Drink enough fluid to keep your urine pale yellow.  Keep all follow-up visits as told by your  health care provider. This is important. Contact a health care provider if:  You have a fever.  You have a sore throat.  You have difficulty swallowing.  You have headache.  You have sinus pain.  You have a cough that does not go away.  The mucus from your nose becomes thick and is green or yellow in color.  You have cold or flu symptoms that last more than 10 days. Summary  Postnasal drip is the feeling of mucus going down the back of your throat.  If your health care provider approves, use nasal irrigation or a nasal spray 2?4 times a day.  Avoid things that you know you are allergic to (allergens), like dust, mold, pollen, pets, or certain foods. This information is not intended to replace advice given to you by your health care provider. Make sure you discuss any questions you have with your health care provider. Document Revised: 05/04/2018 Document Reviewed: 04/25/2016 Elsevier Patient Education  2020 [employer].

## 2019-05-01 ENCOUNTER — Encounter: Payer: Self-pay | Admitting: *Deleted

## 2019-05-01 LAB — COMPLETE METABOLIC PANEL WITH GFR
AG Ratio: 1.4 (calc) (ref 1.0–2.5)
ALT: 13 U/L (ref 9–46)
AST: 18 U/L (ref 10–35)
Albumin: 4.2 g/dL (ref 3.6–5.1)
Alkaline phosphatase (APISO): 53 U/L (ref 35–144)
BUN: 12 mg/dL (ref 7–25)
CO2: 29 mmol/L (ref 20–32)
Calcium: 9.6 mg/dL (ref 8.6–10.3)
Chloride: 102 mmol/L (ref 98–110)
Creat: 1.09 mg/dL (ref 0.70–1.18)
GFR, Est African American: 77 mL/min/{1.73_m2} (ref >=60)
GFR, Est Non African American: 67 mL/min/{1.73_m2} (ref >=60)
Globulin: 3 g/dL (calc) (ref 1.9–3.7)
Glucose, Bld: 125 mg/dL — ABNORMAL HIGH (ref 65–99)
Potassium: 4.3 mmol/L (ref 3.5–5.3)
Sodium: 137 mmol/L (ref 135–146)
Total Bilirubin: 1 mg/dL (ref 0.2–1.2)
Total Protein: 7.2 g/dL (ref 6.1–8.1)

## 2019-05-01 LAB — LIPID PANEL
Cholesterol: 125 mg/dL (ref <200)
HDL: 40 mg/dL (ref >=40)
LDL Cholesterol (Calc): 71 mg/dL (calc)
Non-HDL Cholesterol (Calc): 85 mg/dL (calc) (ref <130)
Total CHOL/HDL Ratio: 3.1 (calc) (ref <5.0)
Triglycerides: 67 mg/dL (ref <150)

## 2019-05-01 LAB — CBC WITH DIFFERENTIAL/PLATELET
Absolute Monocytes: 374 cells/uL (ref 200–950)
Basophils Absolute: 32 cells/uL (ref 0–200)
Basophils Relative: 0.7 %
Eosinophils Absolute: 32 cells/uL (ref 15–500)
Eosinophils Relative: 0.7 %
HCT: 44.5 % (ref 38.5–50.0)
Hemoglobin: 15.1 g/dL (ref 13.2–17.1)
Lymphs Abs: 1179 cells/uL (ref 850–3900)
MCH: 29.4 pg (ref 27.0–33.0)
MCHC: 33.9 g/dL (ref 32.0–36.0)
MCV: 86.6 fL (ref 80.0–100.0)
MPV: 10.2 fL (ref 7.5–12.5)
Monocytes Relative: 8.3 %
Neutro Abs: 2885 cells/uL (ref 1500–7800)
Neutrophils Relative %: 64.1 %
Platelets: 176 10*3/uL (ref 140–400)
RBC: 5.14 10*6/uL (ref 4.20–5.80)
RDW: 12.8 % (ref 11.0–15.0)
Total Lymphocyte: 26.2 %
WBC: 4.5 10*3/uL (ref 3.8–10.8)

## 2019-05-01 LAB — HEMOGLOBIN A1C
Hgb A1c MFr Bld: 7.2 % of total Hgb — ABNORMAL HIGH (ref <5.7)
Mean Plasma Glucose: 160 (calc)
eAG (mmol/L): 8.9 (calc)

## 2019-05-01 LAB — TSH: TSH: 1.07 mIU/L (ref 0.40–4.50)

## 2019-05-07 DIAGNOSIS — H524 Presbyopia: Secondary | ICD-10-CM | POA: Diagnosis not present

## 2019-05-07 DIAGNOSIS — H5203 Hypermetropia, bilateral: Secondary | ICD-10-CM | POA: Diagnosis not present

## 2019-05-07 DIAGNOSIS — H25813 Combined forms of age-related cataract, bilateral: Secondary | ICD-10-CM | POA: Diagnosis not present

## 2019-05-07 DIAGNOSIS — E1136 Type 2 diabetes mellitus with diabetic cataract: Secondary | ICD-10-CM | POA: Diagnosis not present

## 2019-05-07 DIAGNOSIS — H40013 Open angle with borderline findings, low risk, bilateral: Secondary | ICD-10-CM | POA: Diagnosis not present

## 2019-05-07 LAB — HM DIABETES EYE EXAM

## 2019-05-08 ENCOUNTER — Encounter: Payer: Self-pay | Admitting: Nurse Practitioner

## 2019-05-18 ENCOUNTER — Other Ambulatory Visit: Payer: Self-pay | Admitting: Family

## 2019-05-18 DIAGNOSIS — E1142 Type 2 diabetes mellitus with diabetic polyneuropathy: Secondary | ICD-10-CM

## 2019-05-21 ENCOUNTER — Other Ambulatory Visit: Payer: Self-pay | Admitting: Family

## 2019-05-21 NOTE — Telephone Encounter (Signed)
Received EScribe from pharmacy. Pended Rx and sent to [facility] for approval due to HIGH ALERT Warning.

## 2019-07-18 ENCOUNTER — Other Ambulatory Visit: Payer: Self-pay | Admitting: Internal Medicine

## 2019-07-30 ENCOUNTER — Other Ambulatory Visit: Payer: Self-pay | Admitting: Nurse Practitioner

## 2019-07-30 DIAGNOSIS — E785 Hyperlipidemia, unspecified: Secondary | ICD-10-CM

## 2019-09-09 ENCOUNTER — Other Ambulatory Visit: Payer: Self-pay | Admitting: Family

## 2019-09-13 ENCOUNTER — Other Ambulatory Visit: Payer: Self-pay | Admitting: Family

## 2019-10-02 DIAGNOSIS — Z20828 Contact with and (suspected) exposure to other viral communicable diseases: Secondary | ICD-10-CM | POA: Diagnosis not present

## 2019-10-20 ENCOUNTER — Other Ambulatory Visit: Payer: Self-pay | Admitting: Nurse Practitioner

## 2019-10-30 ENCOUNTER — Ambulatory Visit (INDEPENDENT_AMBULATORY_CARE_PROVIDER_SITE_OTHER): Payer: Medicare HMO | Admitting: Nurse Practitioner

## 2019-10-30 ENCOUNTER — Encounter: Payer: Self-pay | Admitting: Nurse Practitioner

## 2019-10-30 ENCOUNTER — Other Ambulatory Visit: Payer: Self-pay

## 2019-10-30 VITALS — BP 142/90 | HR 99 | Temp 97.3°F | Ht 65.0 in | Wt 165.6 lb

## 2019-10-30 DIAGNOSIS — E785 Hyperlipidemia, unspecified: Secondary | ICD-10-CM | POA: Diagnosis not present

## 2019-10-30 DIAGNOSIS — I10 Essential (primary) hypertension: Secondary | ICD-10-CM | POA: Diagnosis not present

## 2019-10-30 DIAGNOSIS — M5441 Lumbago with sciatica, right side: Secondary | ICD-10-CM | POA: Diagnosis not present

## 2019-10-30 DIAGNOSIS — Z23 Encounter for immunization: Secondary | ICD-10-CM | POA: Diagnosis not present

## 2019-10-30 DIAGNOSIS — M5442 Lumbago with sciatica, left side: Secondary | ICD-10-CM

## 2019-10-30 DIAGNOSIS — E1142 Type 2 diabetes mellitus with diabetic polyneuropathy: Secondary | ICD-10-CM

## 2019-10-30 DIAGNOSIS — K219 Gastro-esophageal reflux disease without esophagitis: Secondary | ICD-10-CM | POA: Diagnosis not present

## 2019-10-30 DIAGNOSIS — G8929 Other chronic pain: Secondary | ICD-10-CM | POA: Diagnosis not present

## 2019-10-30 DIAGNOSIS — E559 Vitamin D deficiency, unspecified: Secondary | ICD-10-CM | POA: Diagnosis not present

## 2019-10-30 MED ORDER — OMEPRAZOLE 20 MG PO CPDR: 20.0000 mg | DELAYED_RELEASE_CAPSULE | Freq: Every day | ORAL | 5 refills | Status: DC

## 2019-10-30 NOTE — Progress Notes (Signed)
Careteam: Patient Care Team: Sharon Seller, NP as PCP - General (Geriatric Medicine) Jethro Bolus, MD as Consulting Physician (Ophthalmology)  PLACE OF SERVICE:  [facility]  Advanced Directive information    Allergies  Allergen Reactions   Lisinopril Itching    Chief Complaint  Patient presents with   Medical Management of Chronic Issues    6 month follow-up. Patient c/o back pain. Flu vaccine today. Refill Omeprazole- [facility]      HPI: Patient is a 73 y.o. male for routine follow up.   Has not taken medication this morning  DM- Blood sugar this morning was 99. No low blood sugars noted.  Has not heard about his podiatrist appt.   GERD- controlled omeprazole daily   Has hx of low back pain- going into hip, taking ibuprofen as needed for pain, old injury. Has not done any exercises. Will have sciatica down both legs occasionally. Tylenol 1000 mg has been effective in the past.    Review of Systems:  Review of Systems  Constitutional: Negative for chills, fever and weight loss.  HENT: Negative for hearing loss and tinnitus.   Respiratory: Negative for cough, sputum production and shortness of breath.   Cardiovascular: Negative for chest pain, palpitations and leg swelling.  Gastrointestinal: Positive for diarrhea and heartburn. Negative for abdominal pain and constipation.  Genitourinary: Negative for dysuria, frequency and urgency.  Musculoskeletal: Positive for back pain. Negative for falls, joint pain and myalgias.  Skin: Negative.   Neurological: Positive for sensory change. Negative for dizziness and headaches.  Psychiatric/Behavioral: Negative for depression and memory loss. The patient does not have insomnia.     Past Medical History:  Diagnosis Date   GERD (gastroesophageal reflux disease)    Lumbago    Memory loss    Other and unspecified hyperlipidemia    Type II or unspecified type diabetes mellitus without mention of complication,  uncontrolled    Unspecified arthropathy, shoulder region    Unspecified essential hypertension    Unspecified hereditary and idiopathic peripheral neuropathy    Unspecified vitamin D deficiency    Past Surgical History:  Procedure Laterality Date   COLONOSCOPY  2005   Dr Worthy Flank Medoff   COLONOSCOPY W/ POLYPECTOMY  2012   [facility]- sessile polyp in transverse colon 4 mm and another 6 mm   INGUINAL HERNIA REPAIR  2019   KIDNEY STONE SURGERY  2013   Dr Ihor Gully   Social History:   reports that he quit smoking about 46 years ago. His smoking use included cigarettes. He has a 11.00 pack-year smoking history. He has never used smokeless tobacco. He reports that he does not drink alcohol and does not use drugs.  Family History  Problem Relation Age of Onset   Heart disease Mother    Heart attack Father    Cancer Sister        colon   Cancer Brother        colon   Cancer Brother        colon   Cancer Brother        prostate   Cancer Brother        prostate   Cancer Brother        Pancreas   Stroke Other     Medications: Patient's Medications  New Prescriptions   No medications on file  Previous Medications   ACCU-CHEK AVIVA PLUS TEST STRIP    USE TO CHECK BLOOD SUGAR TWICE DAILY.  ACCU-CHEK SOFTCLIX LANCETS LANCETS    USE TO TEST BLOOD SUGAR TWICE DAILY.   ALCOHOL SWABS (B-D SINGLE USE SWABS REGULAR) PADS    USE DAILY AS DIRECTED WHEN CHECKING BLOOD SUGAR   ASPIRIN 81 MG TABLET    Take 81 mg by mouth daily.   CALCIUM-VITAMIN D (OSCAL 500/200 D-3) 500-200 MG-UNIT PER TABLET    Take 1 tablet by mouth 2 (two) times daily.   FLUTICASONE (FLONASE) 50 MCG/ACT NASAL SPRAY    Place 2 sprays into both nostrils daily.   GABAPENTIN (NEURONTIN) 100 MG CAPSULE    Take 1 capsule (100 mg total) by mouth at bedtime.   IRBESARTAN (AVAPRO) 75 MG TABLET    Take 1 tablet (75 mg total) by mouth daily.   JANUVIA 100 MG TABLET    TAKE 1 TABLET BY MOUTH EVERY DAY    METFORMIN (GLUCOPHAGE) 1000 MG TABLET    TAKE 1 TABLET BY MOUTH TWICE A DAY FOR BLOOD SUGAR   OMEPRAZOLE (PRILOSEC) 20 MG CAPSULE    TAKE 1 CAPSULE BY MOUTH DAILY. TAKE DAILY X 8 WEEKS THEN TAKE ONE BY MOUTH AS NEEDED   SIMVASTATIN (ZOCOR) 10 MG TABLET    TAKE 1 TABLET (10 MG TOTAL) BY MOUTH DAILY AT 6 PM.  Modified Medications   No medications on file  Discontinued Medications   ACETAMINOPHEN (TYLENOL) 500 MG TABLET    Take 2 tablets (1,000 mg total) by mouth in the morning and at bedtime. Take 500 mg tablet daily at 2 Pm.   OMEPRAZOLE (PRILOSEC OTC) 20 MG TABLET    Take 20 mg by mouth daily.    Physical Exam:  Vitals:   10/30/19 1007  BP: (!) 142/90  Pulse: 99  Temp: (!) 97.3 F (36.3 C)  TempSrc: Temporal  SpO2: (!) 75%  Weight: 165 lb 9.6 oz (75.1 kg)  Height: 5\' 5"  (1.651 m)   Body mass index is 27.56 kg/m. Wt Readings from Last 3 Encounters:  10/30/19 165 lb 9.6 oz (75.1 kg)  04/30/19 175 lb (79.4 kg)  09/21/18 168 lb (76.2 kg)    Physical Exam Constitutional:      General: He is not in acute distress.    Appearance: He is well-developed. He is not diaphoretic.  HENT:     Head: Normocephalic and atraumatic.     Mouth/Throat:     Pharynx: No oropharyngeal exudate.  Eyes:     Conjunctiva/sclera: Conjunctivae normal.     Pupils: Pupils are equal, round, and reactive to light.  Cardiovascular:     Rate and Rhythm: Normal rate and regular rhythm.     Heart sounds: Normal heart sounds.  Pulmonary:     Effort: Pulmonary effort is normal.     Breath sounds: Normal breath sounds.  Abdominal:     General: Bowel sounds are normal.     Palpations: Abdomen is soft.  Musculoskeletal:        General: No tenderness.     Cervical back: Normal range of motion and neck supple.     Right lower leg: No edema.     Left lower leg: No edema.  Skin:    General: Skin is warm and dry.  Neurological:     Mental Status: He is alert and oriented to person, place, and time.    Psychiatric:        Mood and Affect: Mood normal.        Behavior: Behavior normal.    Labs reviewed: Basic Metabolic  Panel: Recent Labs    12/28/18 0812 04/30/19 1042  NA 139 137  K 3.9 4.3  CL 103 102  CO2 22 29  GLUCOSE 104* 125*  BUN 18 12  CREATININE 1.18 1.09  CALCIUM 9.6 9.6  TSH  --  1.07   Liver Function Tests: Recent Labs    12/28/18 0812 04/30/19 1042  AST 21 18  ALT 9 13  BILITOT 0.9 1.0  PROT 7.2 7.2   No results for input(s): LIPASE, AMYLASE in the last 8760 hours. No results for input(s): AMMONIA in the last 8760 hours. CBC: Recent Labs    04/30/19 1042  WBC 4.5  NEUTROABS 2,885  HGB 15.1  HCT 44.5  MCV 86.6  PLT 176   Lipid Panel: Recent Labs    12/28/18 0812 04/30/19 1042  CHOL 125 125  HDL 39* 40  LDLCALC 73 71  TRIG 46 67  CHOLHDL 3.2 3.1   TSH: Recent Labs    04/30/19 1042  TSH 1.07   A1C: Lab Results  Component Value Date   HGBA1C 7.2 (H) 04/30/2019     Assessment/Plan 1. Need for influenza vaccination - Flu Vaccine QUAD High Dose(Fluad)  2. DM type 2 with diabetic peripheral neuropathy (HCC) -reports dietary modifications. Encouraged dietary compliance, routine foot care/monitoring and to keep up with diabetic eye exams through ophthalmology  -continues on metformin 1000 mg BID - Hemoglobin A1c - COMPLETE METABOLIC PANEL WITH GFR  3. Essential hypertension -elevated today but has not had medication, does not have a blood pressure cuff at home.  -educated to take blood pressure medication prior to OV. Encouraged low sodium diet.  -plans to follow up in office for blood pressure check with taking medication.  - CBC with Differential/Platelet - COMPLETE METABOLIC PANEL WITH GFR  4. Chronic bilateral low back pain with bilateral sciatica -acute on chronic pain. Reports worsening pain from old injury. To use heat TID ~20 min, muscle rub after heat -tylenol 1000 mg TID as needed for pain. - Ambulatory referral  to Physical Therapy  5. Gastroesophageal reflux disease without esophagitis -continue on omeprazole 20 mg daily, refill provided.   6. Hyperlipidemia LDL goal <70 -at goal at [facility] stime, continues on simvastatin 10 mg daily with dietary modifications.   7. Vitamin D deficiency -continues on supplement.  - Vitamin D, 25-hydroxy    Next appt: 11/06/2019 for blood pressure check 6 months for routine follow up Oiva Dibari K. Biagio Borg  [facility] [phone]

## 2019-10-30 NOTE — Patient Instructions (Addendum)
  To call podiatrist for appt: [facility]) Address: [address], [place], [place] [address] Phone: [phone]  To use heating pad to lower back 2-3 times a day Then apply muscle rub (aspercream, bengay, biofreeze, icyhot) AFTER heat PT referral has been made Safe and effective to use tylenol 1000 mg by mouth every 8 hours as needed for pain    1 week blood pressure check-- make sure you take your medication before your appt so we can see what your blood pressure is with your medication.   6 months follow up for routine follow up

## 2019-10-31 LAB — COMPLETE METABOLIC PANEL WITH GFR
AG Ratio: 1.5 (calc) (ref 1.0–2.5)
ALT: 9 U/L (ref 9–46)
AST: 18 U/L (ref 10–35)
Albumin: 4.3 g/dL (ref 3.6–5.1)
Alkaline phosphatase (APISO): 47 U/L (ref 35–144)
BUN: 11 mg/dL (ref 7–25)
CO2: 27 mmol/L (ref 20–32)
Calcium: 9.7 mg/dL (ref 8.6–10.3)
Chloride: 104 mmol/L (ref 98–110)
Creat: 1.17 mg/dL (ref 0.70–1.18)
GFR, Est African American: 71 mL/min/{1.73_m2} (ref >=60)
GFR, Est Non African American: 61 mL/min/{1.73_m2} (ref >=60)
Globulin: 2.8 g/dL (calc) (ref 1.9–3.7)
Glucose, Bld: 116 mg/dL — ABNORMAL HIGH (ref 65–99)
Potassium: 4.2 mmol/L (ref 3.5–5.3)
Sodium: 138 mmol/L (ref 135–146)
Total Bilirubin: 1 mg/dL (ref 0.2–1.2)
Total Protein: 7.1 g/dL (ref 6.1–8.1)

## 2019-10-31 LAB — CBC WITH DIFFERENTIAL/PLATELET
Absolute Monocytes: 326 cells/uL (ref 200–950)
Basophils Absolute: 20 cells/uL (ref 0–200)
Basophils Relative: 0.6 %
Eosinophils Absolute: 51 cells/uL (ref 15–500)
Eosinophils Relative: 1.5 %
HCT: 42.5 % (ref 38.5–50.0)
Hemoglobin: 14.5 g/dL (ref 13.2–17.1)
Lymphs Abs: 1183 cells/uL (ref 850–3900)
MCH: 29.7 pg (ref 27.0–33.0)
MCHC: 34.1 g/dL (ref 32.0–36.0)
MCV: 87.1 fL (ref 80.0–100.0)
MPV: 10.2 fL (ref 7.5–12.5)
Monocytes Relative: 9.6 %
Neutro Abs: 1819 cells/uL (ref 1500–7800)
Neutrophils Relative %: 53.5 %
Platelets: 168 10*3/uL (ref 140–400)
RBC: 4.88 10*6/uL (ref 4.20–5.80)
RDW: 12.9 % (ref 11.0–15.0)
Total Lymphocyte: 34.8 %
WBC: 3.4 10*3/uL — ABNORMAL LOW (ref 3.8–10.8)

## 2019-10-31 LAB — VITAMIN D 25 HYDROXY (VIT D DEFICIENCY, FRACTURES): Vit D, 25-Hydroxy: 17 ng/mL — ABNORMAL LOW (ref 30–100)

## 2019-10-31 LAB — HEMOGLOBIN A1C
Hgb A1c MFr Bld: 6.8 % of total Hgb — ABNORMAL HIGH (ref <5.7)
Mean Plasma Glucose: 148 (calc)
eAG (mmol/L): 8.2 (calc)

## 2019-11-02 ENCOUNTER — Other Ambulatory Visit: Payer: Self-pay | Admitting: Nurse Practitioner

## 2019-11-02 DIAGNOSIS — E1142 Type 2 diabetes mellitus with diabetic polyneuropathy: Secondary | ICD-10-CM

## 2019-11-06 ENCOUNTER — Ambulatory Visit: Payer: Medicare HMO | Admitting: Nurse Practitioner

## 2019-11-13 ENCOUNTER — Other Ambulatory Visit: Payer: Self-pay | Admitting: Nurse Practitioner

## 2019-11-18 ENCOUNTER — Ambulatory Visit: Payer: Medicare HMO | Attending: Nurse Practitioner | Admitting: Physical Therapy

## 2019-12-04 ENCOUNTER — Other Ambulatory Visit: Payer: Self-pay | Admitting: Nurse Practitioner

## 2019-12-30 ENCOUNTER — Telehealth: Payer: Self-pay

## 2019-12-30 ENCOUNTER — Encounter: Payer: Self-pay | Admitting: Nurse Practitioner

## 2019-12-30 ENCOUNTER — Other Ambulatory Visit: Payer: Self-pay

## 2019-12-30 ENCOUNTER — Ambulatory Visit (INDEPENDENT_AMBULATORY_CARE_PROVIDER_SITE_OTHER): Payer: Medicare HMO | Admitting: Nurse Practitioner

## 2019-12-30 DIAGNOSIS — Z0000 Encounter for general adult medical examination without abnormal findings: Secondary | ICD-10-CM

## 2019-12-30 NOTE — Patient Instructions (Signed)
[name] , Thank you for taking time to come for your Medicare Wellness Visit. I appreciate your ongoing commitment to your health goals. Please review the following plan we discussed and let me know if I can assist you in the future.   Screening recommendations/referrals: Colonoscopy up to date, due 2022 Recommended yearly ophthalmology/optometry visit for glaucoma screening and checkup Recommended yearly dental visit for hygiene and checkup  Vaccinations: Influenza vaccine up to date Pneumococcal vaccine up to date Tdap vaccine up to date Shingles vaccine RECOMMENDED, to get at your local phamacy   Advanced directives: recommended to complete and bring back to office to place on file. To look over MOST form and we can complete at your next OV  Conditions/risks identified: memory loss, cardiovascular disease, complications associated with diabetes  Next appointment: 1 year for AWV  Preventive Care 65 Years and Older, Male Preventive care refers to lifestyle choices and visits with your health care provider that can promote health and wellness. What does preventive care include?  A yearly physical exam. This is also called an annual well check.  Dental exams once or twice a year.  Routine eye exams. Ask your health care provider how often you should have your eyes checked.  Personal lifestyle choices, including:  Daily care of your teeth and gums.  Regular physical activity.  Eating a healthy diet.  Avoiding tobacco and drug use.  Limiting alcohol use.  Practicing safe sex.  Taking low doses of aspirin every day.  Taking vitamin and mineral supplements as recommended by your health care provider. What happens during an annual well check? The services and screenings done by your health care provider during your annual well check will depend on your age, overall health, lifestyle risk factors, and family history of disease. Counseling  Your health care provider may  ask you questions about your:  Alcohol use.  Tobacco use.  Drug use.  Emotional well-being.  Home and relationship well-being.  Sexual activity.  Eating habits.  History of falls.  Memory and ability to understand (cognition).  Work and work Astronomer. Screening  You may have the following tests or measurements:  Height, weight, and BMI.  Blood pressure.  Lipid and cholesterol levels. These may be checked every 5 years, or more frequently if you are over 47 years old.  Skin check.  Lung cancer screening. You may have this screening every year starting at age 26 if you have a 30-pack-year history of smoking and currently smoke or have quit within the past 15 years.  Fecal occult blood test (FOBT) of the stool. You may have this test every year starting at age 3.  Flexible sigmoidoscopy or colonoscopy. You may have a sigmoidoscopy every 5 years or a colonoscopy every 10 years starting at age 56.  Prostate cancer screening. Recommendations will vary depending on your family history and other risks.  Hepatitis C blood test.  Hepatitis B blood test.  Sexually transmitted disease (STD) testing.  Diabetes screening. This is done by checking your blood sugar (glucose) after you have not eaten for a while (fasting). You may have this done every 1-3 years.  Abdominal aortic aneurysm (AAA) screening. You may need this if you are a current or former smoker.  Osteoporosis. You may be screened starting at age 35 if you are at high risk. Talk with your health care provider about your test results, treatment options, and if necessary, the need for more tests. Vaccines  Your health care provider may  recommend certain vaccines, such as:  Influenza vaccine. This is recommended every year.  Tetanus, diphtheria, and acellular pertussis (Tdap, Td) vaccine. You may need a Td booster every 10 years.  Zoster vaccine. You may need this after age 13.  Pneumococcal 13-valent  conjugate (PCV13) vaccine. One dose is recommended after age 47.  Pneumococcal polysaccharide (PPSV23) vaccine. One dose is recommended after age 22. Talk to your health care provider about which screenings and vaccines you need and how often you need them. This information is not intended to replace advice given to you by your health care provider. Make sure you discuss any questions you have with your health care provider. Document Released: 02/06/2015 Document Revised: 09/30/2015 Document Reviewed: 11/11/2014 Elsevier Interactive Patient Education  2017 [place] Prevention in the Home Falls can cause injuries. They can happen to people of all ages. There are many things you can do to make your home safe and to help prevent falls. What can I do on the outside of my home?  Regularly fix the edges of walkways and driveways and fix any cracks.  Remove anything that might make you trip as you walk through a door, such as a raised step or threshold.  Trim any bushes or trees on the path to your home.  Use bright outdoor lighting.  Clear any walking paths of anything that might make someone trip, such as rocks or tools.  Regularly check to see if handrails are loose or broken. Make sure that both sides of any steps have handrails.  Any raised decks and porches should have guardrails on the edges.  Have any leaves, snow, or ice cleared regularly.  Use sand or salt on walking paths during winter.  Clean up any spills in your garage right away. This includes oil or grease spills. What can I do in the bathroom?  Use night lights.  Install grab bars by the toilet and in the tub and shower. Do not use towel bars as grab bars.  Use non-skid mats or decals in the tub or shower.  If you need to sit down in the shower, use a plastic, non-slip stool.  Keep the floor dry. Clean up any water that spills on the floor as soon as it happens.  Remove soap buildup in the tub or  shower regularly.  Attach bath mats securely with double-sided non-slip rug tape.  Do not have throw rugs and other things on the floor that can make you trip. What can I do in the bedroom?  Use night lights.  Make sure that you have a light by your bed that is easy to reach.  Do not use any sheets or blankets that are too big for your bed. They should not hang down onto the floor.  Have a firm chair that has side arms. You can use this for support while you get dressed.  Do not have throw rugs and other things on the floor that can make you trip. What can I do in the kitchen?  Clean up any spills right away.  Avoid walking on wet floors.  Keep items that you use a lot in easy-to-reach places.  If you need to reach something above you, use a strong step stool that has a grab bar.  Keep electrical cords out of the way.  Do not use floor polish or wax that makes floors slippery. If you must use wax, use non-skid floor wax.  Do not have throw rugs and  other things on the floor that can make you trip. What can I do with my stairs?  Do not leave any items on the stairs.  Make sure that there are handrails on both sides of the stairs and use them. Fix handrails that are broken or loose. Make sure that handrails are as long as the stairways.  Check any carpeting to make sure that it is firmly attached to the stairs. Fix any carpet that is loose or worn.  Avoid having throw rugs at the top or bottom of the stairs. If you do have throw rugs, attach them to the floor with carpet tape.  Make sure that you have a light switch at the top of the stairs and the bottom of the stairs. If you do not have them, ask someone to add them for you. What else can I do to help prevent falls?  Wear shoes that:  Do not have high heels.  Have rubber bottoms.  Are comfortable and fit you well.  Are closed at the toe. Do not wear sandals.  If you use a stepladder:  Make sure that it is fully  opened. Do not climb a closed stepladder.  Make sure that both sides of the stepladder are locked into place.  Ask someone to hold it for you, if possible.  Clearly mark and make sure that you can see:  Any grab bars or handrails.  First and last steps.  Where the edge of each step is.  Use tools that help you move around (mobility aids) if they are needed. These include:  Canes.  Walkers.  Scooters.  Crutches.  Turn on the lights when you go into a dark area. Replace any light bulbs as soon as they burn out.  Set up your furniture so you have a clear path. Avoid moving your furniture around.  If any of your floors are uneven, fix them.  If there are any pets around you, be aware of where they are.  Review your medicines with your doctor. Some medicines can make you feel dizzy. This can increase your chance of falling. Ask your doctor what other things that you can do to help prevent falls. This information is not intended to replace advice given to you by your health care provider. Make sure you discuss any questions you have with your health care provider. Document Released: 11/06/2008 Document Revised: 06/18/2015 Document Reviewed: 02/14/2014 Elsevier Interactive Patient Education  2017 [employer].

## 2019-12-30 NOTE — Progress Notes (Signed)
Subjective:   Danny Winters is a 74 y.o. male who presents for Medicare Annual/Subsequent preventive examination.  Review of Systems     Cardiac Risk Factors include: advanced age (>12men, >33 women);dyslipidemia;diabetes mellitus;hypertension;male gender;family history of premature cardiovascular disease     Objective:    There were no vitals filed for this visit. There is no height or weight on file to calculate BMI.  Advanced Directives 12/30/2019 12/26/2018 12/26/2018 12/18/2017 10/08/2017 08/12/2016 11/27/2015  Does Patient Have a Medical Advance Directive? No No No No No No No  Would patient like information on creating a medical advance directive? Yes (ED - Information included in AVS) No - Patient declined No - Patient declined Yes (MAU/Ambulatory/Procedural Areas - Information given) No - Patient declined Yes (MAU/Ambulatory/Procedural Areas - Information given) No - patient declined information  Pre-existing out of facility DNR order (yellow form or pink MOST form) - - - - - - -    Current Medications (verified) Outpatient Encounter Medications as of 12/30/2019  Medication Sig  . ACCU-CHEK AVIVA PLUS test strip USE TO CHECK BLOOD SUGAR TWICE DAILY.  Marland Kitchen Accu-Chek Softclix Lancets lancets USE TO TEST BLOOD SUGAR TWICE DAILY.  Marland Kitchen Alcohol Swabs (B-D SINGLE USE SWABS REGULAR) PADS USE DAILY AS DIRECTED WHEN CHECKING BLOOD SUGAR  . aspirin 81 MG tablet Take 81 mg by mouth daily.  . calcium-vitamin D (OSCAL 500/200 D-3) 500-200 MG-UNIT per tablet Take 1 tablet by mouth 2 (two) times daily.  . Cholecalciferol (VITAMIN D3) 50 MCG (2000 UT) TABS Take 1 tablet by mouth daily. To start after a 12-week course of 50,000 units weekly completed.  . fluticasone (FLONASE) 50 MCG/ACT nasal spray Place 2 sprays into both nostrils daily.  Marland Kitchen gabapentin (NEURONTIN) 100 MG capsule Take 1 capsule (100 mg total) by mouth at bedtime.  . irbesartan (AVAPRO) 75 MG tablet Take 1 tablet (75 mg total) by mouth  daily.  Marland Kitchen JANUVIA 100 MG tablet TAKE 1 TABLET BY MOUTH EVERY DAY  . metFORMIN (GLUCOPHAGE) 1000 MG tablet TAKE 1 TABLET BY MOUTH TWICE A DAY FOR BLOOD SUGAR  . omeprazole (PRILOSEC) 20 MG capsule Take 1 capsule (20 mg total) by mouth daily.  . simvastatin (ZOCOR) 10 MG tablet TAKE 1 TABLET (10 MG TOTAL) BY MOUTH DAILY AT 6 PM.  . Vitamin D, Ergocalciferol, (DRISDOL) 1.25 MG (50000 UNIT) CAPS capsule Take 50,000 Units by mouth every 7 (seven) days. To take for 12 weeks and then switch to 2000 units daily.   No facility-administered encounter medications on file as of 12/30/2019.    Allergies (verified) Lisinopril   History: Past Medical History:  Diagnosis Date  . GERD (gastroesophageal reflux disease)   . Lumbago   . Memory loss   . Other and unspecified hyperlipidemia   . Type II or unspecified type diabetes mellitus without mention of complication, uncontrolled   . Unspecified arthropathy, shoulder region   . Unspecified essential hypertension   . Unspecified hereditary and idiopathic peripheral neuropathy   . Unspecified vitamin D deficiency    Past Surgical History:  Procedure Laterality Date  . COLONOSCOPY  2005   Dr Worthy Flank Medoff  . COLONOSCOPY W/ POLYPECTOMY  2012   [facility]- sessile polyp in transverse colon 4 mm and another 6 mm  . INGUINAL HERNIA REPAIR  2019  . KIDNEY STONE SURGERY  2013   Dr Ihor Gully   Family History  Problem Relation Age of Onset  . Heart disease Mother   . Heart  attack Father   . Cancer Sister        colon  . Cancer Brother        colon  . Cancer Brother        colon  . Cancer Brother        prostate  . Cancer Brother        prostate  . Cancer Brother        Pancreas  . Stroke Other    Social History   Socioeconomic History  . Marital status: Divorced    Spouse name: Not on file  . Number of children: Not on file  . Years of education: Not on file  . Highest education level: Not on file  Occupational History  .  Not on file  Tobacco Use  . Smoking status: Former Smoker    Packs/day: 1.00    Years: 11.00    Pack years: 11.00    Types: Cigarettes    Quit date: 07/01/1973    Years since quitting: 46.5  . Smokeless tobacco: Never Used  Vaping Use  . Vaping Use: Never used  Substance and Sexual Activity  . Alcohol use: No  . Drug use: No  . Sexual activity: Not on file  Other Topics Concern  . Not on file  Social History Narrative  . Not on file   Social Determinants of Health   Financial Resource Strain:   . Difficulty of Paying Living Expenses: Not on file  Food Insecurity:   . Worried About Programme researcher, broadcasting/film/video in the Last Year: Not on file  . Ran Out of Food in the Last Year: Not on file  Transportation Needs:   . Lack of Transportation (Medical): Not on file  . Lack of Transportation (Non-Medical): Not on file  Physical Activity:   . Days of Exercise per Week: Not on file  . Minutes of Exercise per Session: Not on file  Stress:   . Feeling of Stress : Not on file  Social Connections:   . Frequency of Communication with Friends and Family: Not on file  . Frequency of Social Gatherings with Friends and Family: Not on file  . Attends Religious Services: Not on file  . Active Member of Clubs or Organizations: Not on file  . Attends Banker Meetings: Not on file  . Marital Status: Not on file    Tobacco Counseling Counseling given: Not Answered   Clinical Intake:     Pain : No/denies pain     BMI - recorded: 27 Nutritional Status: BMI 25 -29 Overweight Diabetes: Yes  How often do you need to have someone help you when you read instructions, pamphlets, or other written materials from your doctor or pharmacy?: 1 - Never  Diabetic? yes         Activities of Daily Living In your present state of health, do you have any difficulty performing the following activities: 12/30/2019  Hearing? N  Vision? N  Difficulty concentrating or making decisions? Y    Walking or climbing stairs? N  Dressing or bathing? N  Doing errands, shopping? N  Preparing Food and eating ? N  Using the Toilet? N  In the past six months, have you accidently leaked urine? Y  Do you have problems with loss of bowel control? N  Managing your Medications? N  Managing your Finances? N  Housekeeping or managing your Housekeeping? N  Some recent data might be hidden    Patient Care  Team: Sharon Seller, NP as PCP - General (Geriatric Medicine) Jethro Bolus, MD as Consulting Physician (Ophthalmology)  Indicate any recent Medical Services you may have received from other than [facility] providers in the past year (date may be approximate).     Assessment:   This is a routine wellness examination for Danny Winters.  Hearing/Vision screen  Hearing Screening   125Hz  250Hz  500Hz  1000Hz  2000Hz  3000Hz  4000Hz  6000Hz  8000Hz   Right ear:           Left ear:           Comments: Patient has no hearing problems.  Vision Screening Comments: Patient has no vision problems. Patient had recent eye exam. Patient wears reading glasses. Sees Dr.  Dietary issues and exercise activities discussed: Current Exercise Habits: The patient does not participate in regular exercise at present  Goals    . Exercise 2x per week (30 min per time)     Pt will walk more      Depression Screen PHQ 2/9 Scores 12/30/2019 10/30/2019 04/30/2019 12/26/2018 12/18/2017 12/18/2017 08/12/2016  PHQ - 2 Score 0 0 0 0 0 0 0    Fall Risk Fall Risk  12/30/2019 10/30/2019 04/30/2019 12/26/2018 05/15/2018  Falls in the past year? 0 0 0 0 0  Comment - - - - -  Number falls in past yr: 0 0 0 0 0  Comment - - - - -  Injury with Fall? 0 0 0 0 0    FALL RISK PREVENTION PERTAINING TO THE HOME:  Any stairs in or around the home? Yes  If so, are there any without handrails? No  Home free of loose throw rugs in walkways, pet beds, electrical cords, etc? Yes  Adequate lighting in your home to reduce risk of falls?  Yes   ASSISTIVE DEVICES UTILIZED TO PREVENT FALLS:  Life alert? yes Use of a cane, walker or w/c? No  Grab bars in the bathroom? Yes  Shower chair or bench in shower? No  Elevated toilet seat or a handicapped toilet? No   TIMED UP AND GO:  Was the test performed? No .    Cognitive Function: MMSE - Mini Mental State Exam 12/18/2017 08/12/2016 07/17/2013  Orientation to time 4 5 5   Orientation to Place 4 5 5   Registration 3 3 3   Attention/ Calculation 5 5 4   Recall 2 0 1  Language- name 2 objects 2 2 2   Language- repeat 1 1 1   Language- follow 3 step command 3 2 3   Language- read & follow direction 1 1 1   Write a sentence 1 1 1   Copy design 1 1 1   Total score 27 26 27      6CIT Screen 12/30/2019 12/26/2018  What Year? 0 points 0 points  What month? 0 points 0 points  What time? 0 points 0 points  Count back from 20 0 points 0 points  Months in reverse 4 points 0 points  Repeat phrase 10 points 4 points  Total Score 14 4    Immunizations Immunization History  Administered Date(s) Administered  . Fluad Quad(high Dose 65+) 09/21/2018, 10/30/2019  . Influenza, High Dose Seasonal PF 12/18/2017  . Influenza,inj,Quad PF,6+ Mos 11/13/2012, 11/20/2013, 01/23/2015, 11/27/2015, 04/18/2017  . Influenza-Unspecified 11/10/2011  . PFIZER SARS-COV-2 Vaccination 08/20/2019, 09/10/2019  . Pneumococcal Conjugate-13 04/08/2014  . Pneumococcal Polysaccharide-23 10/01/2008, 11/13/2012  . Tdap 01/24/2010    TDAP status: Up to date  Flu Vaccine status: Up to date  Pneumococcal vaccine  status: Up to date  Covid-19 vaccine status: Completed vaccines  Qualifies for Shingles Vaccine? No   Zostavax completed Yes   Shingrix Completed?: Yes  Screening Tests Health Maintenance  Topic Date Due  . TETANUS/TDAP  01/25/2020  . HEMOGLOBIN A1C  04/29/2020  . OPHTHALMOLOGY EXAM  05/06/2020  . FOOT EXAM  10/29/2020  . COLONOSCOPY  12/13/2020  . INFLUENZA VACCINE  Completed  . COVID-19  Vaccine  Completed  . PNA vac Low Risk Adult  Completed  . Hepatitis C Screening  Discontinued    Health Maintenance  There are no preventive care reminders to display for this patient.  Colorectal cancer screening: Type of screening: Colonoscopy. Completed 10. Repeat every 10 years  Lung Cancer Screening: (Low Dose CT Chest recommended if Age 45-80 years, 30 pack-year currently smoking OR have quit w/in 15years.) does not qualify.   Lung Cancer Screening Referral: na  Additional Screening:  Hepatitis C Screening: does qualify; Completed 2020  Vision Screening: Recommended annual ophthalmology exams for early detection of glaucoma and other disorders of the eye. Is the patient up to date with their annual eye exam?  Yes  Who is the provider or what is the name of the office in which the patient attends annual eye exams? Dr Nile RiggsShapiro If pt is not established with a provider, would they like to be referred to a provider to establish care? No .   Dental Screening: Recommended annual dental exams for proper oral hygiene  Community Resource Referral / Chronic Care Management: CRR required this visit?  Yes   CCM required this visit?  Yes      Plan:     I have personally reviewed and noted the following in the patient's chart:   . Medical and social history . Use of alcohol, tobacco or illicit drugs  . Current medications and supplements . Functional ability and status . Nutritional status . Physical activity . Advanced directives . List of other physicians . Hospitalizations, surgeries, and ER visits in previous 12 months . Vitals . Screenings to include cognitive, depression, and falls . Referrals and appointments  In addition, I have reviewed and discussed with patient certain preventive protocols, quality metrics, and best practice recommendations. A written personalized care plan for preventive services as well as general preventive health recommendations were provided  to patient.     Sharon SellerJessica K Hila Bolding, NP   12/30/2019    Virtual Visit via Telephone Note  I connected with@ on 12/30/19 at 10:00 AM EST by telephone and verified that I am speaking with the correct person using two identifiers.  Location: Patient: home Provider: [facility]   I discussed the limitations, risks, security and privacy concerns of performing an evaluation and management service by telephone and the availability of in person appointments. I also discussed with the patient that there may be a patient responsible charge related to this service. The patient expressed understanding and agreed to proceed.   I discussed the assessment and treatment plan with the patient. The patient was provided an opportunity to ask questions and all were answered. The patient agreed with the plan and demonstrated an understanding of the instructions.   The patient was advised to call back or seek an in-person evaluation if the symptoms worsen or if the condition fails to improve as anticipated.  I provided 18 minutes of non-face-to-face time during this encounter.  Janene HarveyJessica K. Biagio BorgEubanks, AGNP Avs printed and mailed

## 2019-12-30 NOTE — Progress Notes (Signed)
This service is provided via telemedicine  No vital signs collected/recorded due to the encounter was a telemedicine visit.   Location of patient (ex: home, work):  [place]  Patient consents to a telephone visit: Yes, see encounter dated 12/30/2019  Location of the provider (ex: office, home):  [facility]  Name of any referring provider:  N/A  Names of all persons participating in the telemedicine service and their role in the encounter:  Abbey Chatters, Nurse Practitioner, Elveria Royals, CMA, and patient.   Time spent on call:  8 minutes with medical assistant

## 2019-12-30 NOTE — Telephone Encounter (Signed)
[name], hoopes are scheduled for a virtual visit with your provider today.    Just as we do with appointments in the office, we must obtain your consent to participate.  Your consent will be active for this visit and any virtual visit you may have with one of our providers in the next 365 days.    If you have a MyChart account, I can also send a copy of this consent to you electronically.  All virtual visits are billed to your insurance company just like a traditional visit in the office.  As this is a virtual visit, video technology does not allow for your provider to perform a traditional examination.  This may limit your provider's ability to fully assess your condition.  If your provider identifies any concerns that need to be evaluated in person or the need to arrange testing such as labs, EKG, etc, we will make arrangements to do so.    Although advances in technology are sophisticated, we cannot ensure that it will always work on either your end or our end.  If the connection with a video visit is poor, we may have to switch to a telephone visit.  With either a video or telephone visit, we are not always able to ensure that we have a secure connection.   I need to obtain your verbal consent now.   Are you willing to proceed with your visit today?   Danny Winters has provided verbal consent on 12/30/2019 for a virtual visit (video or telephone).   Elveria Royals, CMA 12/30/2019  10:15 AM

## 2020-01-10 ENCOUNTER — Other Ambulatory Visit: Payer: Self-pay | Admitting: Nurse Practitioner

## 2020-01-16 ENCOUNTER — Other Ambulatory Visit: Payer: Self-pay | Admitting: Nurse Practitioner

## 2020-02-24 ENCOUNTER — Encounter: Payer: Self-pay | Admitting: Family

## 2020-02-24 ENCOUNTER — Other Ambulatory Visit: Payer: Self-pay

## 2020-02-24 ENCOUNTER — Ambulatory Visit (INDEPENDENT_AMBULATORY_CARE_PROVIDER_SITE_OTHER): Payer: Medicare HMO | Admitting: Family

## 2020-02-24 VITALS — BP 128/68 | HR 86 | Temp 97.7°F | Resp 16 | Ht 65.0 in | Wt 165.2 lb

## 2020-02-24 DIAGNOSIS — R42 Dizziness and giddiness: Secondary | ICD-10-CM | POA: Diagnosis not present

## 2020-02-24 NOTE — Patient Instructions (Addendum)
- Drink 6-8 glasses of water daily  - Notify provider if symptoms worsen or fail to improve   Dizziness Dizziness is a common problem. It makes you feel unsteady or light-headed. You may feel like you are about to pass out (faint). Dizziness can lead to getting hurt if you stumble or fall. Dizziness can be caused by many things, including:  Medicines.  Not having enough water in your body (dehydration).  Illness. Follow these instructions at home: Eating and drinking  Drink enough fluid to keep your pee (urine) clear or pale yellow. This helps to keep you from getting dehydrated. Try to drink more clear fluids, such as water.  Do not drink alcohol.  Limit how much caffeine you drink or eat, if your doctor tells you to do that.  Limit how much salt (sodium) you drink or eat, if your doctor tells you to do that.   Activity  Avoid making quick movements. ? When you stand up from sitting in a chair, steady yourself until you feel okay. ? In the morning, first sit up on the side of the bed. When you feel okay, stand slowly while you hold onto something. Do this until you know that your balance is fine.  If you need to stand in one place for a long time, move your legs often. Tighten and relax the muscles in your legs while you are standing.  Do not drive or use heavy machinery if you feel dizzy.  Avoid bending down if you feel dizzy. Place items in your home so you can reach them easily without leaning over.   Lifestyle  Do not use any products that contain nicotine or tobacco, such as cigarettes and e-cigarettes. If you need help quitting, ask your doctor.  Try to lower your stress level. You can do this by using methods such as yoga or meditation. Talk with your doctor if you need help. General instructions  Watch your dizziness for any changes.  Take over-the-counter and prescription medicines only as told by your doctor. Talk with your doctor if you think that you are dizzy  because of a medicine that you are taking.  Tell a friend or a family member that you are feeling dizzy. If he or she notices any changes in your behavior, have this person call your doctor.  Keep all follow-up visits as told by your doctor. This is important. Contact a doctor if:  Your dizziness does not go away.  Your dizziness or light-headedness gets worse.  You feel sick to your stomach (nauseous).  You have trouble hearing.  You have new symptoms.  You are unsteady on your feet.  You feel like the room is spinning. Get help right away if:  You throw up (vomit) or have watery poop (diarrhea), and you cannot eat or drink anything.  You have trouble: ? Talking. ? Walking. ? Swallowing. ? Using your arms, hands, or legs.  You feel generally weak.  You are not thinking clearly, or you have trouble forming sentences. A friend or family member may notice this.  You have: ? Chest pain. ? Pain in your belly (abdomen). ? Shortness of breath. ? Sweating.  Your vision changes.  You are bleeding.  You have a very bad headache.  You have neck pain or a stiff neck.  You have a fever. These symptoms may be an emergency. Do not wait to see if the symptoms will go away. Get medical help right away. Call your local emergency  services (911 in the [place] Do not drive yourself to the hospital. Summary  Dizziness makes you feel unsteady or light-headed. You may feel like you are about to pass out (faint).  Drink enough fluid to keep your pee (urine) clear or pale yellow. Do not drink alcohol.  Avoid making quick movements if you feel dizzy.  Watch your dizziness for any changes. This information is not intended to replace advice given to you by your health care provider. Make sure you discuss any questions you have with your health care provider. Document Revised: 10/02/2019 Document Reviewed: 01/28/2016 Elsevier Patient Education  2021 [employer].

## 2020-02-24 NOTE — Progress Notes (Signed)
Provider:   FNP-C  Lauree Chandler, NP  Patient Care Team: Lauree Chandler, NP as PCP - General (Geriatric Medicine) Rutherford Guys, MD as Consulting Physician (Ophthalmology)  Extended Emergency Contact Information Primary Emergency Contact: Whisonant,Catherine Address: [address], [place] Phone: [phone] Relation: Spouse Secondary Emergency Contact: Farris [place] Phone: [phone] Relation: Daughter  Code Status: Full Code  Goals of care: Advanced Directive information Advanced Directives 02/24/2020  Does Patient Have a Medical Advance Directive? No  Would patient like information on creating a medical advance directive? No - Patient declined  Pre-existing out of facility DNR order (yellow form or pink MOST form) -     Chief Complaint  Patient presents with  . Acute Visit    Complains of dizziness x 1 week.    HPI:  Pt is a 75 y.o. male seen today for an acute visit for evaluation of dizziness x 1 week.dizziness occurs when he lies down and close his eye or turn a certain way.The TV sometimes seems to be moving from side to side.unable to watch the TV during those moments.Dizziness is intermittent. He does eats three meals.Not skipping meals. He drinks unsweetened tea two cups per day but does not drink water " I need to get better" daughter keeps reminding him to drink more water.   He was seen by Ophthalmology and was told has glaucoma.He got some reading glasses but makes him crazy.  CBG at home before breakfast was 93  postprandial running in the 90's -130. He denies any headache,fatigue,nausea,vomiting,fatigue,palpitation or chest pain.    Past Medical History:  Diagnosis Date  . GERD (gastroesophageal reflux disease)   . Lumbago   . Memory loss   . Other and unspecified hyperlipidemia   . Type II or unspecified type diabetes mellitus without mention of  complication, uncontrolled   . Unspecified arthropathy, shoulder region   . Unspecified essential hypertension   . Unspecified hereditary and idiopathic peripheral neuropathy   . Unspecified vitamin D deficiency    Past Surgical History:  Procedure Laterality Date  . COLONOSCOPY  2005   Dr Sharlotte Alamo Medoff  . COLONOSCOPY W/ POLYPECTOMY  2012   [facility]- sessile polyp in transverse colon 4 mm and another 6 mm  . INGUINAL HERNIA REPAIR  2019  . KIDNEY STONE SURGERY  2013   Dr Kathie Rhodes    Allergies  Allergen Reactions  . Lisinopril Itching    Outpatient Encounter Medications as of 02/24/2020  Medication Sig  . ACCU-CHEK AVIVA PLUS test strip USE TO CHECK BLOOD SUGAR TWICE DAILY.  Marland Kitchen Accu-Chek Softclix Lancets lancets USE TO TEST BLOOD SUGAR TWICE DAILY.  Marland Kitchen Alcohol Swabs (B-D SINGLE USE SWABS REGULAR) PADS USE DAILY AS DIRECTED WHEN CHECKING BLOOD SUGAR  . aspirin 81 MG tablet Take 81 mg by mouth daily.  . calcium-vitamin D (OSCAL 500/200 D-3) 500-200 MG-UNIT per tablet Take 1 tablet by mouth 2 (two) times daily.  . Cholecalciferol (VITAMIN D3) 50 MCG (2000 UT) TABS Take 1 tablet by mouth daily. To start after a 12-week course of 50,000 units weekly completed.  . fluticasone (FLONASE) 50 MCG/ACT nasal spray Place 2 sprays into both nostrils daily.  Marland Kitchen gabapentin (NEURONTIN) 100 MG capsule Take 1 capsule (100 mg total) by mouth at bedtime.  . irbesartan (AVAPRO) 75 MG tablet Take 1 tablet (75 mg total) by mouth daily.  Marland Kitchen  JANUVIA 100 MG tablet TAKE 1 TABLET BY MOUTH EVERY DAY  . metFORMIN (GLUCOPHAGE) 1000 MG tablet TAKE 1 TABLET BY MOUTH TWICE A DAY FOR BLOOD SUGAR  . omeprazole (PRILOSEC) 20 MG capsule Take 1 capsule (20 mg total) by mouth daily.  . simvastatin (ZOCOR) 10 MG tablet TAKE 1 TABLET (10 MG TOTAL) BY MOUTH DAILY AT 6 PM.  . Vitamin D, Ergocalciferol, (DRISDOL) 1.25 MG (50000 UNIT) CAPS capsule Take 50,000 Units by mouth every 7 (seven) days. To take for 12 weeks  and then switch to 2000 units daily.   No facility-administered encounter medications on file as of 02/24/2020.    Review of Systems  Constitutional: Negative for appetite change, chills, fatigue and fever.  HENT: Negative for congestion, hearing loss, rhinorrhea, sinus pressure, sinus pain, sneezing and sore throat.   Eyes: Positive for visual disturbance. Negative for discharge, redness and itching.       Follows up with Opthalmology   Respiratory: Negative for cough, chest tightness, shortness of breath and wheezing.   Cardiovascular: Negative for chest pain, palpitations and leg swelling.  Endocrine: Negative for cold intolerance, heat intolerance, polydipsia, polyphagia and polyuria.  Genitourinary: Negative for difficulty urinating, dysuria, flank pain, frequency and urgency.  Musculoskeletal: Negative for arthralgias, gait problem and myalgias.  Skin: Negative for color change and pallor.  Neurological: Positive for dizziness. Negative for speech difficulty, weakness, light-headedness, numbness and headaches.       Dizziness per HPI   Psychiatric/Behavioral: Negative for agitation, behavioral problems and sleep disturbance.    Immunization History  Administered Date(s) Administered  . Fluad Quad(high Dose 65+) 09/21/2018, 10/30/2019  . Influenza, High Dose Seasonal PF 12/18/2017  . Influenza,inj,Quad PF,6+ Mos 11/13/2012, 11/20/2013, 01/23/2015, 11/27/2015, 04/18/2017  . Influenza-Unspecified 11/10/2011  . PFIZER(Purple Top)SARS-COV-2 Vaccination 08/20/2019, 09/10/2019  . Pneumococcal Conjugate-13 04/08/2014  . Pneumococcal Polysaccharide-23 10/01/2008, 11/13/2012  . Tdap 01/24/2010   Pertinent  Health Maintenance Due  Topic Date Due  . HEMOGLOBIN A1C  04/29/2020  . OPHTHALMOLOGY EXAM  05/06/2020  . FOOT EXAM  10/29/2020  . COLONOSCOPY (Pts 45-74yr Insurance coverage will need to be confirmed)  12/13/2020  . INFLUENZA VACCINE  Completed  . PNA vac Low Risk Adult   Completed   Fall Risk  02/24/2020 12/30/2019 10/30/2019 04/30/2019 12/26/2018  Falls in the past year? 0 0 0 0 0  Comment - - - - -  Number falls in past yr: 0 0 0 0 0  Comment - - - - -  Injury with Fall? 0 0 0 0 0   Functional Status Survey:    Vitals:   02/24/20 1412  BP: 128/68  Pulse: 86  Resp: 16  Temp: 97.7 F (36.5 C)  SpO2: 98%  Weight: 165 lb 3.2 oz (74.9 kg)  Height: 5' 5" (1.651 m)   Body mass index is 27.49 kg/m. Physical Exam Vitals reviewed.  Constitutional:      General: He is not in acute distress.    Appearance: He is overweight. He is not ill-appearing.  HENT:     Head: Normocephalic.     Right Ear: Tympanic membrane, ear canal and external ear normal. There is no impacted cerumen.     Left Ear: Tympanic membrane, ear canal and external ear normal. There is no impacted cerumen.     Nose: Nose normal. No congestion or rhinorrhea.     Mouth/Throat:     Mouth: Mucous membranes are moist.     Pharynx: Oropharynx is clear. No  oropharyngeal exudate or posterior oropharyngeal erythema.  Eyes:     General: No scleral icterus.       Right eye: No discharge.        Left eye: No discharge.     Extraocular Movements: Extraocular movements intact.     Conjunctiva/sclera: Conjunctivae normal.     Pupils: Pupils are equal, round, and reactive to light.  Neck:     Vascular: No carotid bruit.  Cardiovascular:     Rate and Rhythm: Normal rate and regular rhythm.     Pulses: Normal pulses.     Heart sounds: Normal heart sounds. No murmur heard. No friction rub. No gallop.   Pulmonary:     Effort: Pulmonary effort is normal. No respiratory distress.     Breath sounds: Normal breath sounds. No wheezing, rhonchi or rales.  Chest:     Chest wall: No tenderness.  Abdominal:     General: Bowel sounds are normal. There is no distension.     Palpations: Abdomen is soft. There is no mass.     Tenderness: There is no abdominal tenderness. There is no right CVA tenderness,  left CVA tenderness, guarding or rebound.  Musculoskeletal:        General: No swelling or tenderness. Normal range of motion.     Cervical back: Normal range of motion. No rigidity or tenderness.     Right lower leg: No edema.     Left lower leg: No edema.  Lymphadenopathy:     Cervical: No cervical adenopathy.  Skin:    General: Skin is warm and dry.     Coloration: Skin is not pale.     Findings: No bruising, erythema or rash.  Neurological:     Mental Status: He is oriented to person, place, and time.     Cranial Nerves: No cranial nerve deficit.     Sensory: No sensory deficit.     Motor: No weakness.     Coordination: Coordination normal.     Gait: Gait normal.  Psychiatric:        Mood and Affect: Mood normal.        Behavior: Behavior normal.        Thought Content: Thought content normal.        Judgment: Judgment normal.     Labs reviewed: Recent Labs    04/30/19 1042 10/30/19 1036  NA 137 138  K 4.3 4.2  CL 102 104  CO2 29 27  GLUCOSE 125* 116*  BUN 12 11  CREATININE 1.09 1.17  CALCIUM 9.6 9.7   Recent Labs    04/30/19 1042 10/30/19 1036  AST 18 18  ALT 13 9  BILITOT 1.0 1.0  PROT 7.2 7.1   Recent Labs    04/30/19 1042 10/30/19 1036  WBC 4.5 3.4*  NEUTROABS 2,885 1,819  HGB 15.1 14.5  HCT 44.5 42.5  MCV 86.6 87.1  PLT 176 168   Lab Results  Component Value Date   TSH 1.07 04/30/2019   Lab Results  Component Value Date   HGBA1C 6.8 (H) 10/30/2019   Lab Results  Component Value Date   CHOL 125 04/30/2019   HDL 40 04/30/2019   LDLCALC 71 04/30/2019   TRIG 67 04/30/2019   CHOLHDL 3.1 04/30/2019    Significant Diagnostic Results in last 30 days:  No results found.  Assessment/Plan   Dizziness Unclear etiology.No orthostatics.neuro exam negative. - Advised to increase water intake to 6-8 glasses of water daily  -  Notify provider if symptoms worsen or fail to improve  - will rule out anemia,infectious and metabolic etiology -  Additional educational information provided on AVS - CBC with Differential/Platelet - BMP with eGFR([facility])  Family/ staff Communication: Reviewed plan of care with patient  Labs/tests ordered:   - CBC with Differential/Platelet - BMP with eGFR([facility])  Next Appointment: As needed if symptoms worsen or fail to improve.   Sandrea Hughs, NP

## 2020-02-25 LAB — CBC WITH DIFFERENTIAL/PLATELET
Absolute Monocytes: 406 cells/uL (ref 200–950)
Basophils Absolute: 21 cells/uL (ref 0–200)
Basophils Relative: 0.6 %
Eosinophils Absolute: 39 cells/uL (ref 15–500)
Eosinophils Relative: 1.1 %
HCT: 44.4 % (ref 38.5–50.0)
Hemoglobin: 15 g/dL (ref 13.2–17.1)
Lymphs Abs: 1393 cells/uL (ref 850–3900)
MCH: 29.4 pg (ref 27.0–33.0)
MCHC: 33.8 g/dL (ref 32.0–36.0)
MCV: 87.1 fL (ref 80.0–100.0)
MPV: 10.3 fL (ref 7.5–12.5)
Monocytes Relative: 11.6 %
Neutro Abs: 1642 cells/uL (ref 1500–7800)
Neutrophils Relative %: 46.9 %
Platelets: 173 10*3/uL (ref 140–400)
RBC: 5.1 10*6/uL (ref 4.20–5.80)
RDW: 13.1 % (ref 11.0–15.0)
Total Lymphocyte: 39.8 %
WBC: 3.5 10*3/uL — ABNORMAL LOW (ref 3.8–10.8)

## 2020-02-25 LAB — BASIC METABOLIC PANEL WITH GFR
BUN: 13 mg/dL (ref 7–25)
CO2: 29 mmol/L (ref 20–32)
Calcium: 9.7 mg/dL (ref 8.6–10.3)
Chloride: 100 mmol/L (ref 98–110)
Creat: 1.12 mg/dL (ref 0.70–1.18)
GFR, Est African American: 74 mL/min/{1.73_m2} (ref >=60)
GFR, Est Non African American: 64 mL/min/{1.73_m2} (ref >=60)
Glucose, Bld: 84 mg/dL (ref 65–139)
Potassium: 4.4 mmol/L (ref 3.5–5.3)
Sodium: 136 mmol/L (ref 135–146)

## 2020-03-09 ENCOUNTER — Other Ambulatory Visit: Payer: Self-pay | Admitting: Family

## 2020-03-10 ENCOUNTER — Other Ambulatory Visit: Payer: Self-pay

## 2020-03-10 ENCOUNTER — Ambulatory Visit
Admission: EM | Admit: 2020-03-10 | Discharge: 2020-03-10 | Disposition: A | Discharge status: Home / Self Care | Payer: Medicare HMO | Attending: Emergency Medicine | Admitting: Emergency Medicine

## 2020-03-10 ENCOUNTER — Encounter: Payer: Self-pay | Admitting: Emergency Medicine

## 2020-03-10 DIAGNOSIS — R42 Dizziness and giddiness: Secondary | ICD-10-CM | POA: Diagnosis not present

## 2020-03-10 DIAGNOSIS — S46912A Strain of unspecified muscle, fascia and tendon at shoulder and upper arm level, left arm, initial encounter: Secondary | ICD-10-CM | POA: Diagnosis not present

## 2020-03-10 MED ORDER — NAPROXEN 375 MG PO TABS: 375.0000 mg | ORAL_TABLET | Freq: Two times a day (BID) | ORAL | 0 refills | Status: DC

## 2020-03-10 MED ORDER — TIZANIDINE HCL 2 MG PO TABS: 2.0000 mg | ORAL_TABLET | Freq: Four times a day (QID) | ORAL | 0 refills | Status: DC | PRN

## 2020-03-10 MED ORDER — MECLIZINE HCL 12.5 MG PO TABS: 12.5000 mg | ORAL_TABLET | Freq: Three times a day (TID) | ORAL | 0 refills | Status: DC | PRN

## 2020-03-10 NOTE — Discharge Instructions (Signed)
Dizziness-drink plenty of water, try meclizine 1 to 2 tablets as needed for dizziness, may cause drowsiness Try to perform Epley maneuver or logroll maneuver at home-YouTube this for video Follow-up with primary care persisting Go to emergency room if symptoms worsening, developing one-sided weakness, difficulty speaking, confusion, one-sided numbness tingling  Shoulder pain-Naprosyn twice daily, supplement with tizanidine at home/bedtime which is a muscle relaxer Alternate ice and heat Gentle stretching Follow-up if not improving or worsening
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
## 2020-03-14 DIAGNOSIS — Z1212 Encounter for screening for malignant neoplasm of rectum: Secondary | ICD-10-CM

## 2020-03-14 DIAGNOSIS — Z1211 Encounter for screening for malignant neoplasm of colon: Secondary | ICD-10-CM

## 2020-03-17 ENCOUNTER — Ambulatory Visit: Payer: Self-pay | Admitting: Orthopedic Surgery

## 2020-03-18 ENCOUNTER — Other Ambulatory Visit: Payer: Self-pay

## 2020-03-18 ENCOUNTER — Encounter: Payer: Self-pay | Admitting: Family

## 2020-03-18 ENCOUNTER — Ambulatory Visit (INDEPENDENT_AMBULATORY_CARE_PROVIDER_SITE_OTHER): Payer: Medicare HMO | Admitting: Family

## 2020-03-18 VITALS — BP 128/80 | HR 98 | Temp 98.7°F | Resp 16 | Ht 65.0 in | Wt 160.0 lb

## 2020-03-18 DIAGNOSIS — M25512 Pain in left shoulder: Secondary | ICD-10-CM | POA: Diagnosis not present

## 2020-03-18 DIAGNOSIS — R413 Other amnesia: Secondary | ICD-10-CM | POA: Diagnosis not present

## 2020-03-18 DIAGNOSIS — R42 Dizziness and giddiness: Secondary | ICD-10-CM

## 2020-03-18 LAB — CBC WITH DIFFERENTIAL/PLATELET
Absolute Monocytes: 477 cells/uL (ref 200–950)
Basophils Absolute: 21 cells/uL (ref 0–200)
Basophils Relative: 0.4 %
Eosinophils Absolute: 37 cells/uL (ref 15–500)
Eosinophils Relative: 0.7 %
HCT: 46.6 % (ref 38.5–50.0)
Hemoglobin: 16.1 g/dL (ref 13.2–17.1)
Lymphs Abs: 1500 cells/uL (ref 850–3900)
MCH: 29.9 pg (ref 27.0–33.0)
MCHC: 34.5 g/dL (ref 32.0–36.0)
MCV: 86.6 fL (ref 80.0–100.0)
MPV: 10.2 fL (ref 7.5–12.5)
Monocytes Relative: 9 %
Neutro Abs: 3265 cells/uL (ref 1500–7800)
Neutrophils Relative %: 61.6 %
Platelets: 196 10*3/uL (ref 140–400)
RBC: 5.38 10*6/uL (ref 4.20–5.80)
RDW: 12.9 % (ref 11.0–15.0)
Total Lymphocyte: 28.3 %
WBC: 5.3 10*3/uL (ref 3.8–10.8)

## 2020-03-18 MED ORDER — DICLOFENAC SODIUM 1 % EX GEL: 2.0000 g | Freq: Four times a day (QID) | CUTANEOUS | 3 refills | Status: DC
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
## 2020-03-18 NOTE — Patient Instructions (Addendum)
-   referral order to outpatient Physical Therapy for left shoulder pain and dizziness.Rehab will call you for appointment  - Referral also ordered for Neurologist for dizziness and memory loss  - Order place for Orthopedic specialist for left shoulder pain

## 2020-03-21 ENCOUNTER — Other Ambulatory Visit: Payer: Self-pay

## 2020-03-21 ENCOUNTER — Ambulatory Visit (HOSPITAL_COMMUNITY)
Admission: EM | Admit: 2020-03-21 | Discharge: 2020-03-21 | Disposition: A | Discharge status: Home / Self Care | Payer: Medicare HMO | Attending: Emergency Medicine | Admitting: Emergency Medicine

## 2020-03-21 ENCOUNTER — Encounter (HOSPITAL_COMMUNITY): Payer: Self-pay | Admitting: *Deleted

## 2020-03-21 DIAGNOSIS — R42 Dizziness and giddiness: Secondary | ICD-10-CM | POA: Diagnosis not present

## 2020-03-21 DIAGNOSIS — R0781 Pleurodynia: Secondary | ICD-10-CM | POA: Diagnosis not present

## 2020-03-21 HISTORY — DX: Personal history of other diseases of the musculoskeletal system and connective tissue: Z87.39

## 2020-03-21 MED ORDER — MECLIZINE HCL 12.5 MG PO TABS: 12.5000 mg | ORAL_TABLET | Freq: Three times a day (TID) | ORAL | 0 refills | Status: DC | PRN
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
## 2020-03-21 NOTE — Discharge Instructions (Signed)
Only take the meclizine as needed for dizziness.  Follow-up with your primary care provider next week to discuss if you need to remain on this medication.    Take Tylenol or ibuprofen as needed for discomfort.
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
## 2020-03-23 ENCOUNTER — Telehealth: Payer: Self-pay | Admitting: Neurology

## 2020-03-23 ENCOUNTER — Encounter: Payer: Self-pay | Admitting: Nurse Practitioner

## 2020-03-23 ENCOUNTER — Ambulatory Visit: Payer: Medicare HMO | Admitting: Neurology

## 2020-03-23 ENCOUNTER — Encounter: Payer: Self-pay | Admitting: Neurology

## 2020-03-23 VITALS — Ht 69.0 in | Wt 159.0 lb

## 2020-03-23 DIAGNOSIS — R413 Other amnesia: Secondary | ICD-10-CM | POA: Diagnosis not present

## 2020-03-23 DIAGNOSIS — R011 Cardiac murmur, unspecified: Secondary | ICD-10-CM

## 2020-03-23 DIAGNOSIS — Z8249 Family history of ischemic heart disease and other diseases of the circulatory system: Secondary | ICD-10-CM | POA: Diagnosis not present

## 2020-03-23 DIAGNOSIS — H81399 Other peripheral vertigo, unspecified ear: Secondary | ICD-10-CM

## 2020-03-23 DIAGNOSIS — R42 Dizziness and giddiness: Secondary | ICD-10-CM

## 2020-03-23 DIAGNOSIS — E559 Vitamin D deficiency, unspecified: Secondary | ICD-10-CM | POA: Diagnosis not present

## 2020-03-23 DIAGNOSIS — Z818 Family history of other mental and behavioral disorders: Secondary | ICD-10-CM
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
## 2020-03-23 NOTE — Telephone Encounter (Signed)
[employer] pending uplaoded notes  

## 2020-03-23 NOTE — Patient Instructions (Signed)
Unfortunately, dizziness is a very common complaint but is often not due to a primary neurological reason or single underlying medical problem. Often, there a combination of factors, that result in dizziness. This includes blood pressure fluctuations, medication side effects, blood sugar fluctuations, stress, vertigo, poor sleep with sleep deprivation, dehydration, and electrolyte disturbance or other metabolic and endocrinological reasons, meaning hormone related problems such as thyroid dysfunction. We will investigate things further with a brain MRI and MR angiogram of the neck. We will call you with the test results.  We may consider a sleep study.    I noticed a systolic heart murmur.  Please discuss with your primary care nurse practitioner, Abbey Chatters, or your referring nurse practitioner, Babs Sciara, a referral to a cardiologist for further evaluation.  You also have complaints of memory loss: memory loss or changes in cognitive function can have many reasons and does not always mean you have dementia. Conditions that can contribute to subjective or objective memory loss include: depression, stress, poor sleep from insomnia or sleep apnea, dehydration, fluctuation in blood sugar values, thyroid or electrolyte dysfunction and certain vitamin deficiencies. Dementia can be caused by stroke, brain atherosclerosis or brain vascular disease due to vascular risk factors (smoking, high blood pressure, high cholesterol, obesity and uncontrolled diabetes), certain degenerative brain disorders (including Parkinson's disease and Multiple sclerosis) and by Alzheimer's disease or other, more rare and sometimes hereditary causes. We will do some additional testing: blood work (which we will do today) and we will do a brain scan, as mentioned.

## 2020-03-23 NOTE — Telephone Encounter (Signed)
Message forwarded to Eubanks, Jessica K, NP  

## 2020-03-24 LAB — RHEUMATOID FACTOR: Rheumatoid fact SerPl-aCnc: <10 IU/mL (ref <14.0)

## 2020-03-24 LAB — COMPREHENSIVE METABOLIC PANEL
ALT: 7 IU/L (ref 0–44)
AST: 20 IU/L (ref 0–40)
Albumin/Globulin Ratio: 1.6 (ref 1.2–2.2)
Albumin: 4.4 g/dL (ref 3.7–4.7)
Alkaline Phosphatase: 58 IU/L (ref 44–121)
BUN/Creatinine Ratio: 16 (ref 10–24)
BUN: 18 mg/dL (ref 8–27)
Bilirubin Total: 0.9 mg/dL (ref 0.0–1.2)
CO2: 24 mmol/L (ref 20–29)
Calcium: 9.7 mg/dL (ref 8.6–10.2)
Chloride: 100 mmol/L (ref 96–106)
Creatinine, Ser: 1.1 mg/dL (ref 0.76–1.27)
Globulin, Total: 2.7 g/dL (ref 1.5–4.5)
Glucose: 100 mg/dL — ABNORMAL HIGH (ref 65–99)
Potassium: 4.2 mmol/L (ref 3.5–5.2)
Sodium: 138 mmol/L (ref 134–144)
Total Protein: 7.1 g/dL (ref 6.0–8.5)
eGFR: 70 mL/min/{1.73_m2} (ref >59)

## 2020-03-24 LAB — B12 AND FOLATE PANEL
Folate: 14 ng/mL (ref >3.0)
Vitamin B-12: 207 pg/mL — ABNORMAL LOW (ref 232–1245)

## 2020-03-24 LAB — RPR: RPR Ser Ql: NONREACTIVE

## 2020-03-24 LAB — C-REACTIVE PROTEIN: CRP: <1 mg/L (ref 0–10)

## 2020-03-24 LAB — VITAMIN D 25 HYDROXY (VIT D DEFICIENCY, FRACTURES): Vit D, 25-Hydroxy: 9.5 ng/mL — ABNORMAL LOW (ref 30.0–100.0)

## 2020-03-24 LAB — TSH: TSH: 1.37 u[IU]/mL (ref 0.450–4.500)

## 2020-03-24 LAB — SEDIMENTATION RATE: Sed Rate: 2 mm/hr (ref 0–30)

## 2020-03-24 LAB — ANA W/REFLEX: Anti Nuclear Antibody (ANA): NEGATIVE

## 2020-03-24 LAB — HGB A1C W/O EAG: Hgb A1c MFr Bld: 7.1 % — ABNORMAL HIGH (ref 4.8–5.6)

## 2020-03-24 NOTE — Progress Notes (Signed)
Please call patient and advise him that his vitamin D level was low at 9.5.  I recommend he start an over-the-counter vitamin D at 2000 units/day for now and follow-up with primary care as soon as possible to discuss vitamin D supplementation including prescription vitamin D.  In addition, his vitamin B12 was low and I recommend that he start an oral vitamin B12 supplement, typically 1000 mcg daily for now but close follow-up with primary care is recommended and he will need to discuss prescription vitamin B12 including injections for now.  His A1c which is a marker for diabetes was elevated at 7.1, we typically recommend a hemoglobin A1c goal of less than 7.0 for diabetics.  Other test results including autoimmune marker called ANA, inflammatory markers, including CRP and sedimentation rate, and his thyroid screening test called TSH were all normal.  Again, I would recommend making a follow-up appointment soon as possible with the primary care.

## 2020-03-26 ENCOUNTER — Other Ambulatory Visit: Payer: Self-pay

## 2020-03-26 ENCOUNTER — Other Ambulatory Visit: Payer: Self-pay | Admitting: Orthopaedic Surgery

## 2020-03-26 ENCOUNTER — Ambulatory Visit (INDEPENDENT_AMBULATORY_CARE_PROVIDER_SITE_OTHER): Payer: Medicare HMO

## 2020-03-26 ENCOUNTER — Encounter: Payer: Self-pay | Admitting: Orthopaedic Surgery

## 2020-03-26 ENCOUNTER — Ambulatory Visit (INDEPENDENT_AMBULATORY_CARE_PROVIDER_SITE_OTHER): Payer: Medicare HMO | Admitting: Orthopaedic Surgery

## 2020-03-26 ENCOUNTER — Encounter: Payer: Self-pay | Admitting: Nurse Practitioner

## 2020-03-26 VITALS — Ht 69.0 in | Wt 160.0 lb

## 2020-03-26 DIAGNOSIS — G8929 Other chronic pain: Secondary | ICD-10-CM

## 2020-03-26 DIAGNOSIS — M542 Cervicalgia: Secondary | ICD-10-CM

## 2020-03-26 DIAGNOSIS — M545 Low back pain, unspecified: Secondary | ICD-10-CM

## 2020-03-26 MED ORDER — IBUPROFEN 800 MG PO TABS: 400.0000 mg | ORAL_TABLET | Freq: Three times a day (TID) | ORAL | 2 refills | Status: DC | PRN

## 2020-03-26 MED ORDER — MECLIZINE HCL 12.5 MG PO TABS: 12.5000 mg | ORAL_TABLET | Freq: Three times a day (TID) | ORAL | 1 refills | Status: DC | PRN
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
## 2020-03-27 ENCOUNTER — Telehealth: Payer: Medicare HMO | Admitting: Nurse Practitioner

## 2020-03-30 ENCOUNTER — Encounter: Payer: Self-pay | Admitting: Nurse Practitioner

## 2020-03-30 NOTE — Telephone Encounter (Signed)
Danny Winters: [phone] (exp. 03/23/20 to 04/22/20) order sent to GI. They will reach out to the patient to schedule.

## 2020-03-31 ENCOUNTER — Other Ambulatory Visit: Payer: Self-pay

## 2020-03-31 ENCOUNTER — Telehealth (INDEPENDENT_AMBULATORY_CARE_PROVIDER_SITE_OTHER): Payer: Medicare HMO | Admitting: Nurse Practitioner

## 2020-03-31 DIAGNOSIS — R413 Other amnesia: Secondary | ICD-10-CM

## 2020-03-31 DIAGNOSIS — M542 Cervicalgia: Secondary | ICD-10-CM | POA: Diagnosis not present

## 2020-03-31 DIAGNOSIS — E1142 Type 2 diabetes mellitus with diabetic polyneuropathy: Secondary | ICD-10-CM

## 2020-03-31 DIAGNOSIS — R42 Dizziness and giddiness: Secondary | ICD-10-CM | POA: Diagnosis not present

## 2020-03-31 DIAGNOSIS — R011 Cardiac murmur, unspecified: Secondary | ICD-10-CM | POA: Diagnosis not present

## 2020-03-31 DIAGNOSIS — E559 Vitamin D deficiency, unspecified: Secondary | ICD-10-CM

## 2020-03-31 DIAGNOSIS — E538 Deficiency of other specified B group vitamins: Secondary | ICD-10-CM | POA: Diagnosis not present

## 2020-03-31 MED ORDER — VITAMIN D (ERGOCALCIFEROL) 1.25 MG (50000 UNIT) PO CAPS: 50000.0000 [IU] | ORAL_CAPSULE | ORAL | 1 refills | Status: DC

## 2020-03-31 NOTE — Progress Notes (Signed)
Careteam: Patient Care Team: Sharon Seller, NP as PCP - General (Geriatric Medicine) Jethro Bolus, MD as Consulting Physician (Ophthalmology)  Advanced Directive information    Allergies  Allergen Reactions  . Lisinopril Itching    Chief Complaint  Patient presents with  . Acute Visit    Would like to discuss labs and symptom management.Patient's daughter Clydie Braun, says neurologist found heart murmur and needs cardiologist referral.  . Best Practice Recommendations    Tetanus/Tdap and COVID booster      HPI: Patient is a 75 y.o. male connecting via virtual visit. Reports hard for him to get out to appts due to ongoing dizziness.  He was recently referred to neurology for work-up.  Labs reviewed   The neurologist gave him the eply maneuver but he has not done it yet. Waiting on PT referral. (will order today)  Vit D level low at 9.5- he has started 2000 units OTC that his daughter got.   Vit B12- does not want to get injection- has started on B12 oral supplement.   DM- A1c at 7.1, he is not taking [place] consistently  Taking metformin 1000 mg by mouth twice daily.   Plan to have MRI of neck and Brain  Heart murmur heard during neurologist follow up.  Having persistent neck pain- saw orthopedics, neuro planning on doing MRI of the brain  Review of Systems:  Review of Systems  Constitutional: Negative for chills, fever and weight loss.  HENT: Negative for tinnitus.   Respiratory: Negative for cough, sputum production and shortness of breath.   Cardiovascular: Negative for chest pain, palpitations and leg swelling.  Gastrointestinal: Negative for abdominal pain, constipation, diarrhea and heartburn.  Genitourinary: Negative for dysuria, frequency and urgency.  Musculoskeletal: Positive for neck pain. Negative for back pain, falls, joint pain and myalgias.  Skin: Negative.   Neurological: Positive for dizziness. Negative for headaches.  Psychiatric/Behavioral:  Positive for memory loss. Negative for depression. The patient does not have insomnia.     Past Medical History:  Diagnosis Date  . GERD (gastroesophageal reflux disease)   . H/O degenerative disc disease   . Lumbago   . Memory loss   . Other and unspecified hyperlipidemia   . Type II or unspecified type diabetes mellitus without mention of complication, uncontrolled   . Unspecified arthropathy, shoulder region   . Unspecified essential hypertension   . Unspecified hereditary and idiopathic peripheral neuropathy   . Unspecified vitamin D deficiency    Past Surgical History:  Procedure Laterality Date  . COLONOSCOPY  2005   Dr Worthy Flank Medoff  . COLONOSCOPY W/ POLYPECTOMY  2012   [facility]- sessile polyp in transverse colon 4 mm and another 6 mm  . INGUINAL HERNIA REPAIR  2019  . KIDNEY STONE SURGERY  2013   Dr Ihor Gully   Social History:   reports that he quit smoking about 46 years ago. His smoking use included cigarettes. He has a 11.00 pack-year smoking history. He has never used smokeless tobacco. He reports previous alcohol use. He reports that he does not use drugs.  Family History  Problem Relation Age of Onset  . Heart disease Mother   . Heart attack Father   . Cancer Sister        colon  . Cancer Brother        colon  . Cancer Brother        colon  . Cancer Brother  prostate  . Cancer Brother        prostate  . Cancer Brother        Pancreas  . Stroke Other     Medications: Patient's Medications  New Prescriptions   No medications on file  Previous Medications   ACCU-CHEK AVIVA PLUS TEST STRIP    USE TO CHECK BLOOD SUGAR TWICE DAILY.   ACCU-CHEK SOFTCLIX LANCETS LANCETS    USE TO TEST BLOOD SUGAR TWICE DAILY.   ALCOHOL SWABS (B-D SINGLE USE SWABS REGULAR) PADS    USE DAILY AS DIRECTED WHEN CHECKING BLOOD SUGAR   ASPIRIN 81 MG TABLET    Take 81 mg by mouth daily.   CHOLECALCIFEROL (VITAMIN D3) 50 MCG (2000 UT) TABS    Take 1 tablet by  mouth daily. To start after a 12-week course of 50,000 units weekly completed.   DICLOFENAC SODIUM (VOLTAREN) 1 % GEL    Apply 2 g topically 4 (four) times daily.   FLUTICASONE (FLONASE) 50 MCG/ACT NASAL SPRAY    Place 2 sprays into both nostrils daily.   GABAPENTIN (NEURONTIN) 100 MG CAPSULE    Take 1 capsule (100 mg total) by mouth at bedtime.   IBUPROFEN (ADVIL) 800 MG TABLET    Take 0.5-1 tablets (400-800 mg total) by mouth every 8 (eight) hours as needed.   IRBESARTAN (AVAPRO) 75 MG TABLET    TAKE 1 TABLET BY MOUTH EVERY DAY   JANUVIA 100 MG TABLET    TAKE 1 TABLET BY MOUTH EVERY DAY   MECLIZINE (ANTIVERT) 12.5 MG TABLET    Take 1-2 tablets (12.5-25 mg total) by mouth 3 (three) times daily as needed for dizziness.   METFORMIN (GLUCOPHAGE) 1000 MG TABLET    TAKE 1 TABLET BY MOUTH TWICE A DAY FOR BLOOD SUGAR   OMEPRAZOLE (PRILOSEC) 20 MG CAPSULE    Take 1 capsule (20 mg total) by mouth daily.   SIMVASTATIN (ZOCOR) 10 MG TABLET    TAKE 1 TABLET (10 MG TOTAL) BY MOUTH DAILY AT 6 PM.   TIZANIDINE (ZANAFLEX) 2 MG TABLET    Take 1-2 tablets (2-4 mg total) by mouth every 6 (six) hours as needed for muscle spasms.   VITAMIN C (ASCORBIC ACID) 500 MG TABLET    Take 500 mg by mouth daily.   VITAMIN D, ERGOCALCIFEROL, (DRISDOL) 1.25 MG (50000 UNIT) CAPS CAPSULE    Take 50,000 Units by mouth every 7 (seven) days. To take for 12 weeks and then switch to 2000 units daily.  Modified Medications   No medications on file  Discontinued Medications   CALCIUM-VITAMIN D (OSCAL 500/200 D-3) 500-200 MG-UNIT PER TABLET    Take 1 tablet by mouth 2 (two) times daily.   NAPROXEN (NAPROSYN) 375 MG TABLET    Take 1 tablet (375 mg total) by mouth 2 (two) times daily.    Physical Exam:  There were no vitals filed for this visit. There is no height or weight on file to calculate BMI. Wt Readings from Last 3 Encounters:  03/26/20 160 lb (72.6 kg)  03/23/20 159 lb (72.1 kg)  03/18/20 160 lb (72.6 kg)      Labs  reviewed: Basic Metabolic Panel: Recent Labs    04/30/19 1042 10/30/19 1036 02/24/20 1458 03/23/20 1114  NA 137 138 136 138  K 4.3 4.2 4.4 4.2  CL 102 104 100 100  CO2 29 27 29 24   GLUCOSE 125* 116* 84 100*  BUN 12 11 13 18   CREATININE 1.09  1.17 1.12 1.10  CALCIUM 9.6 9.7 9.7 9.7  TSH 1.07  --   --  1.370   Liver Function Tests: Recent Labs    04/30/19 1042 10/30/19 1036 03/23/20 1114  AST 18 18 20   ALT 13 9 7   ALKPHOS  --   --  58  BILITOT 1.0 1.0 0.9  PROT 7.2 7.1 7.1  ALBUMIN  --   --  4.4   No results for input(s): LIPASE, AMYLASE in the last 8760 hours. No results for input(s): AMMONIA in the last 8760 hours. CBC: Recent Labs    10/30/19 1036 02/24/20 1458 03/18/20 1206  WBC 3.4* 3.5* 5.3  NEUTROABS 1,819 1,642 3,265  HGB 14.5 15.0 16.1  HCT 42.5 44.4 46.6  MCV 87.1 87.1 86.6  PLT 168 173 196   Lipid Panel: Recent Labs    04/30/19 1042  CHOL 125  HDL 40  LDLCALC 71  TRIG 67  CHOLHDL 3.1   TSH: Recent Labs    04/30/19 1042 03/23/20 1114  TSH 1.07 1.370   A1C: Lab Results  Component Value Date   HGBA1C 7.1 (H) 03/23/2020     Assessment/Plan 1. Vitamin D deficiency - Vitamin D, Ergocalciferol, (DRISDOL) 1.25 MG (50000 UNIT) CAPS capsule; Take 1 capsule (50,000 Units total) by mouth every 7 (seven) days. Dispense: 12 capsule; Refill: 1  2. Dizziness -ongoing, was referred to neurology for further evaluation, MRI pending, continues on meclizine PRN.  - Ambulatory referral to Physical Therapy for vestibular rehab.   3. Murmur, cardiac - ECHOCARDIOGRAM COMPLETE; Future  4. Neck pain MRI of neck ordered by neurology for further evaluation. Also saw ortho. Recommended to stop advil due to GI and renal side effects and use short course naproxen 1 tablet BID for 5-7 days with food. To use heat TID with muscle rub after.  5. Memory loss or impairment Ongoing and family noticing worsening of symptoms. MRI ordered for evaluation. Also noted  to have b12 def and currently on supplement.  6. DM type 2 with diabetic peripheral neuropathy (HCC) Not taking januvia routinely. Will start back taking daily with metformin 1000 mg BID.  Continue to monitor blood sugars and monitor for hypoglycemia. Continue proper diet with 3 meals a day.   7. B12 deficiency -continues on b12 1000 mcg daily   Next appt: 04/29/2020 as scheduled.  03/25/2020. 06/29/2020  [facility] [phone]    Virtual Visit via [facility]   I connected with patient on 03/31/20 at 10:30 AM EST by video and verified that I am speaking with the correct person using two identifiers.  Location: Patient: home Provider: [place]   I discussed the limitations, risks, security and privacy concerns of performing an evaluation and management service by telephone and the availability of in person appointments. I also discussed with the patient that there may be a patient responsible charge related to this service. The patient expressed understanding and agreed to proceed.   I discussed the assessment and treatment plan with the patient. The patient was provided an opportunity to ask questions and all were answered. The patient agreed with the plan and demonstrated an understanding of the instructions.   The patient was advised to call back or seek an in-person evaluation if the symptoms worsen or if the condition fails to improve as anticipated.  I provided 31 minutes of non-face-to-face time during this encounter.  Earleen Reaper. 05/31/20 Avs printed and mailed

## 2020-03-31 NOTE — Progress Notes (Signed)
This service is provided via telemedicine  No vital signs collected/recorded due to the encounter was a telemedicine visit.   Location of patient (ex: home, work):  Home  Patient consents to a telephone visit:  Yes, see encounter dated 12/30/2019  Location of the provider (ex: office, home): [facility]  Name of any referring provider:  N/A  Names of all persons participating in the telemedicine service and their role in the encounter: Abbey Chatters, Nurse Practitioner, Elveria Royals, CMA, patient and daughter, Clydie Braun.  Time spent on call:7 minutes with medical assistant

## 2020-04-06 ENCOUNTER — Ambulatory Visit: Payer: Medicare HMO | Attending: Nurse Practitioner

## 2020-04-06 ENCOUNTER — Other Ambulatory Visit: Payer: Self-pay

## 2020-04-06 DIAGNOSIS — M542 Cervicalgia: Secondary | ICD-10-CM | POA: Diagnosis not present

## 2020-04-06 DIAGNOSIS — R2681 Unsteadiness on feet: Secondary | ICD-10-CM | POA: Insufficient documentation

## 2020-04-06 DIAGNOSIS — R42 Dizziness and giddiness: Secondary | ICD-10-CM | POA: Diagnosis not present

## 2020-04-06 DIAGNOSIS — R293 Abnormal posture: Secondary | ICD-10-CM | POA: Insufficient documentation
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
## 2020-04-08 ENCOUNTER — Encounter: Payer: Self-pay | Admitting: Neurology

## 2020-04-08 ENCOUNTER — Encounter: Payer: Self-pay | Admitting: Nurse Practitioner

## 2020-04-14 ENCOUNTER — Ambulatory Visit: Payer: Medicare HMO | Admitting: Physical Therapy

## 2020-04-14 ENCOUNTER — Encounter: Payer: Self-pay | Admitting: Physical Therapy

## 2020-04-14 ENCOUNTER — Other Ambulatory Visit: Payer: Self-pay

## 2020-04-14 DIAGNOSIS — M542 Cervicalgia: Secondary | ICD-10-CM

## 2020-04-14 DIAGNOSIS — R42 Dizziness and giddiness: Secondary | ICD-10-CM | POA: Diagnosis not present

## 2020-04-14 DIAGNOSIS — R293 Abnormal posture: Secondary | ICD-10-CM | POA: Diagnosis not present

## 2020-04-14 DIAGNOSIS — R2681 Unsteadiness on feet: Secondary | ICD-10-CM | POA: Diagnosis not present

## 2020-04-14 NOTE — Therapy (Signed)
[facility] [address] [place], [place], [address] Phone: [phone]   Fax:  [phone]  Physical Therapy Treatment  Patient Details  Name: Danny Winters MRN: [id number] Date of Birth: 11-05-1945 Referring Provider (PT): Abbey Chatters, NP   Encounter Date: 04/14/2020   PT End of Session - 04/14/20 0955    Visit Number 2    Number of Visits 13    Date for PT Re-Evaluation 06/05/20   POC for 6 weeks, Cert for 60 days   Authorization Type [employer] (10th Visit PN)    Progress Note Due on Visit 10    PT Start Time 0853    PT Stop Time 0934    PT Time Calculation (min) 41 min    Activity Tolerance Patient tolerated treatment well    Behavior During Therapy [facility] for tasks assessed/performed           Past Medical History:  Diagnosis Date  . GERD (gastroesophageal reflux disease)   . H/O degenerative disc disease   . Lumbago   . Memory loss   . Other and unspecified hyperlipidemia   . Type II or unspecified type diabetes mellitus without mention of complication, uncontrolled   . Unspecified arthropathy, shoulder region   . Unspecified essential hypertension   . Unspecified hereditary and idiopathic peripheral neuropathy   . Unspecified vitamin D deficiency     Past Surgical History:  Procedure Laterality Date  . COLONOSCOPY  2005   Dr Worthy Flank Medoff  . COLONOSCOPY W/ POLYPECTOMY  2012   [facility]- sessile polyp in transverse colon 4 mm and another 6 mm  . INGUINAL HERNIA REPAIR  2019  . KIDNEY STONE SURGERY  2013   Dr Ihor Gully    There were no vitals filed for this visit.   Subjective Assessment - 04/14/20 0858    Subjective Wasn't able to eat breakfast this morning; asking for crackers to prevent low CBG.  No questions or issues since evaluation.  Pt feels like neck, low back and shoulders are getting worse.  Sometimes he feels like the dizziness is getting worse, especially when  riding in the car.  Feels slightly dizziness after riding in the car.    Patient is accompained by: Family member   Daughter   Pertinent History GERD, Memory Loss, Lumbago, DM Type 2, HTN, HLD, Peripheral Neuropathy    Limitations Walking    Diagnostic tests Cervical X-Ray: Multilevel mild degenerative disc disease. Lumbar X-ray: Advanced degenerative disc disease at L5-S1. Patient scheduled to have MRI.    Patient Stated Goals resolve dizziness    Currently in Pain? Yes    Pain Location Neck    Pain Orientation Left;Right    Pain Descriptors / Indicators Discomfort              [facility] PT Assessment - 04/14/20 0903      ROM / Strength   AROM / PROM / Strength AROM      AROM   Overall AROM  Deficits    AROM Assessment Site Cervical    Cervical Flexion 65   slight dizziness   Cervical Extension 40   pain down RUE, dizziness   Cervical - Right Side Bend 20   pain   Cervical - Left Side Bend 10   pain on R side   Cervical - Right Rotation 25   pain   Cervical - Left Rotation 45  Vestibular Assessment - 04/14/20 0915      Positional Testing   Sidelying Test Sidelying Right;Sidelying Left      Dix-Hallpike Right   Dix-Hallpike Right Duration >30 seconds    Dix-Hallpike Right Symptoms No nystagmus      Dix-Hallpike Left   Dix-Hallpike Left Duration >30 seconds    Dix-Hallpike Left Symptoms No nystagmus      Sidelying Right   Sidelying Right Duration >30 seconds    Sidelying Right Symptoms No nystagmus      Sidelying Left   Sidelying Left Duration >30 seconds    Sidelying Left Symptoms No nystagmus      Horizontal Canal Right   Horizontal Canal Right Duration 0    Horizontal Canal Right Symptoms Normal      Horizontal Canal Left   Horizontal Canal Left Duration 0    Horizontal Canal Left Symptoms Normal      Positional Sensitivities   Sit to Supine No dizziness    Supine to Left Side Mild dizziness    Supine to Right Side Mild dizziness     Supine to Sitting Mild dizziness    Right Hallpike Mild dizziness    Up from Right Hallpike Mild dizziness    Up from Left Hallpike Mild dizziness    Nose to Right Knee No dizziness    Right Knee to Sitting No dizziness    Nose to Left Knee No dizziness    Left Knee to Sitting No dizziness    Head Turning x 5 Lightheadedness    Head Nodding x 5 No dizziness    Pivot Right in Standing Mild dizziness    Pivot Left in Standing Lightheadedness    Rolling Right Mild dizziness   from R side > supine   Rolling Left Mild dizziness   From L side to supine   Positional Sensitivities Comments increased RUE radicular symptoms when performing R hallpike                            PT Education - 04/14/20 0953    Education Details ongoing clinical findings, unable to determine BPPV at this time - will re-assess when neck demonstrates increased ROM and decreased radicular pain; educated on dry needling as option - will discuss further after MRI, educated pt on extra sensitive nervous system after traumatic event and how that can drive symptoms of pain and dizziness.    Person(s) Educated Patient;Child(ren)    Methods Explanation    Comprehension Verbalized understanding            PT Short Term Goals - 04/14/20 0959      PT SHORT TERM GOAL #1   Title Patient will be independent with initial HEP for Vestibular and ROM/Strengthening (ALL STGs Due: 04/27/20)    Baseline no HEP established    Time 3    Period Weeks    Status New    Target Date 04/27/20      PT SHORT TERM GOAL #2   Title Further vestibular (DVA/MSQ/Convergence) and cervical involvement to be assesed and LTG to be set as appropriate.    Time 3    Period Weeks    Status Achieved             PT Long Term Goals - 04/14/20 0959      PT LONG TERM GOAL #1   Title Patient will be independent with Final HEP for Vestibular/ROM/Strengthening (ALL LTGs Due: 05/18/20)  Baseline no HEP established    Time 6     Period Weeks    Status New      PT LONG TERM GOAL #2   Title Pt will demonstrate 10 degree improvement in pain free cervical spine extension, bilat lateral flexion and rotation to R.    Baseline see flowsheets    Time 6    Period Weeks    Status Revised      PT LONG TERM GOAL #3   Title Pt will report 0/5 for all movements on MSQ.    Time 6    Period Weeks    Status New      PT LONG TERM GOAL #4   Title Patient will report 50% improvement in dizziness and radiculopathy symptoms with bed mobility and functional activities    Time 6    Period Weeks    Status New                 Plan - 04/14/20 0955    Clinical Impression Statement Continued to perform assessment of cervical and vestibular impairments.  Pt does present with significant cervical ROM limitations and radicular symptoms.  Unable to fully determine if pt presents with positional vertigo due to cervical ROM limitations and radicular symptoms.  Pt also presents with visual motion sensitivity.  PT to begin to address cervical spine ROM and habituation next session.  Will continue to discuss dry needling after MRI.    Personal Factors and Comorbidities Age;Comorbidity 3+;Time since onset of injury/illness/exacerbation    Comorbidities Advanced degenerative disc disease at L5-S1.    Examination-Activity Limitations Locomotion Level;Reach Overhead    Examination-Participation Restrictions Driving;Cleaning;Interpersonal Relationship    Stability/Clinical Decision Making Evolving/Moderate complexity    Rehab Potential Good    PT Frequency 2x / week    PT Duration 6 weeks    PT Treatment/Interventions ADLs/Self Care Home Management;Canalith Repostioning;Electrical Stimulation;Cryotherapy;Moist Heat;Traction;Gait training;Stair training;Functional mobility training;Therapeutic activities;Therapeutic exercise;Balance training;Neuromuscular re-education;Patient/family education;Manual techniques;Passive range of motion;Dry  needling;Vestibular;Spinal Manipulations;Joint Manipulations    PT Next Visit Plan Joint Position Error. Assess M-CTSIB for Balance. Initiate HEP focused on Cervical ROM, habituation.  Cervical and thoracic manual therapy.  Revisit dry needling possibiltiy after MRI.  Once neck ROM improves, recheck for BPPV if dizziness continues.    Consulted and Agree with Plan of Care Patient;Family member/caregiver    Family Member Consulted Daughter           Patient will benefit from skilled therapeutic intervention in order to improve the following deficits and impairments:  Decreased range of motion,Increased muscle spasms,Dizziness,Decreased activity tolerance,Pain,Postural dysfunction,Impaired sensation,Decreased strength,Impaired flexibility,Hypomobility  Visit Diagnosis: Cervicalgia  Dizziness and giddiness  Unsteadiness on feet     Problem List Patient Active Problem List   Diagnosis Date Noted  . Essential hypertension 01/23/2015  . Advanced care planning/counseling discussion 04/08/2014  . Memory loss or impairment 11/20/2013  . Mild cognitive impairment 07/17/2013  . Smoking history 07/17/2013  . Vitamin D deficiency 11/13/2012  . Diabetic neuropathy (HCC) 11/13/2012  . DM type 2 with diabetic peripheral neuropathy (HCC) 11/13/2012  . Benign hypertensive heart disease without heart failure 11/13/2012  . Hyperlipidemia LDL goal <100 11/13/2012    Dierdre Highman, PT, DPT 04/14/20    10:02 AM    [facility] [address] [place], [place], [address] Phone: [phone]   Fax:  [phone]  Name: Layten Aiken MRN: [id number] Date of Birth: 1945-10-09

## 2020-04-17 ENCOUNTER — Encounter: Payer: Self-pay | Admitting: Physical Therapy

## 2020-04-17 ENCOUNTER — Other Ambulatory Visit: Payer: Self-pay

## 2020-04-17 ENCOUNTER — Ambulatory Visit: Payer: Medicare HMO | Admitting: Physical Therapy

## 2020-04-17 DIAGNOSIS — R2681 Unsteadiness on feet: Secondary | ICD-10-CM | POA: Diagnosis not present

## 2020-04-17 DIAGNOSIS — M542 Cervicalgia: Secondary | ICD-10-CM | POA: Diagnosis not present

## 2020-04-17 DIAGNOSIS — R42 Dizziness and giddiness: Secondary | ICD-10-CM

## 2020-04-17 DIAGNOSIS — R293 Abnormal posture: Secondary | ICD-10-CM | POA: Diagnosis not present

## 2020-04-17 NOTE — Therapy (Signed)
[facility] [address] [place], [place], [address] Phone: [phone]   Fax:  [phone]  Physical Therapy Treatment  Patient Details  Name: Danny Winters MRN: [id number] Date of Birth: 05/08/1945 Referring Provider (PT): Abbey Chatters, NP   Encounter Date: 04/17/2020   PT End of Session - 04/17/20 1049    Visit Number 3    Number of Visits 13    Date for PT Re-Evaluation 06/05/20   POC for 6 weeks, Cert for 60 days   Authorization Type [employer] (10th Visit PN) - approved for 13 visits from 04/06/20 - 06/05/20    Progress Note Due on Visit 10    PT Start Time 0807    PT Stop Time 0850    PT Time Calculation (min) 43 min    Activity Tolerance Patient tolerated treatment well    Behavior During Therapy [facility] for tasks assessed/performed           Past Medical History:  Diagnosis Date  . GERD (gastroesophageal reflux disease)   . H/O degenerative disc disease   . Lumbago   . Memory loss   . Other and unspecified hyperlipidemia   . Type II or unspecified type diabetes mellitus without mention of complication, uncontrolled   . Unspecified arthropathy, shoulder region   . Unspecified essential hypertension   . Unspecified hereditary and idiopathic peripheral neuropathy   . Unspecified vitamin D deficiency     Past Surgical History:  Procedure Laterality Date  . COLONOSCOPY  2005   Dr Worthy Flank Medoff  . COLONOSCOPY W/ POLYPECTOMY  2012   [facility]- sessile polyp in transverse colon 4 mm and another 6 mm  . INGUINAL HERNIA REPAIR  2019  . KIDNEY STONE SURGERY  2013   Dr Ihor Gully    There were no vitals filed for this visit.   Subjective Assessment - 04/17/20 0810    Subjective Feeling better but still having some dizziness when riding in the car.  Neck is still stiff.    Patient is accompained by: Family member   Daughter   Pertinent History GERD, Memory Loss, Lumbago, DM Type 2, HTN,  HLD, Peripheral Neuropathy    Limitations Walking    Diagnostic tests Cervical X-Ray: Multilevel mild degenerative disc disease. Lumbar X-ray: Advanced degenerative disc disease at L5-S1. Patient scheduled to have MRI.    Patient Stated Goals resolve dizziness    Currently in Pain? Yes                             [facility] Adult PT Treatment/Exercise - 04/17/20 0812      Therapeutic Activites    Therapeutic Activities Other Therapeutic Activities    Other Therapeutic Activities plan to discuss trigger point dry needling again after pt receives MRI results.      Exercises   Exercises Neck      Manual Therapy   Manual Therapy Joint mobilization;Soft tissue mobilization;Passive ROM;Manual Traction;Neural Stretch    Manual therapy comments Performed in supine with gradual increase in amount of pressure to patient tolerance.    Joint Mobilization First performed segmental assessment of cervical spine mobility into extension, rotation and lateral flexion.  More hypomobile on L but more tender to palpation on R side.  Performed grade I unilateral PA mobilizations to upper, mid and lower cervical spine to facilitate tolerance to mobilizations and begin to introduce small range of segmental mobility.  Soft tissue mobilization STM to right upper trapezius with manual trigger point release and elongation of upper trap progressively increasing amount of pressure    Passive ROM Performed PROM into cervical rotation to L and R with patient in supine with slight traction and gradual increase in ROM; after STM to upper trap performed PROM to R upper trap in supine x 2 sets x 60 seconds with deep breathing and progressive shoulder depression on R    Manual Traction Performed in supine combined with suboccipital release with graded pressure to improve patient tolerance; performed for >5 minutes prior to starting mobilizations.  Pt reported intermittent lightheadedness but resolved quickly     Neural Stretch Performed R median nerve neural flossing with head laterally flexed to R with therapist guiding patient through RUE movement gently x 10 reps in supine.                    PT Short Term Goals - 04/14/20 0959      PT SHORT TERM GOAL #1   Title Patient will be independent with initial HEP for Vestibular and ROM/Strengthening (ALL STGs Due: 04/27/20)    Baseline no HEP established    Time 3    Period Weeks    Status New    Target Date 04/27/20      PT SHORT TERM GOAL #2   Title Further vestibular (DVA/MSQ/Convergence) and cervical involvement to be assesed and LTG to be set as appropriate.    Time 3    Period Weeks    Status Achieved             PT Long Term Goals - 04/14/20 0959      PT LONG TERM GOAL #1   Title Patient will be independent with Final HEP for Vestibular/ROM/Strengthening (ALL LTGs Due: 05/18/20)    Baseline no HEP established    Time 6    Period Weeks    Status New      PT LONG TERM GOAL #2   Title Pt will demonstrate 10 degree improvement in pain free cervical spine extension, bilat lateral flexion and rotation to R.    Baseline see flowsheets    Time 6    Period Weeks    Status Revised      PT LONG TERM GOAL #3   Title Pt will report 0/5 for all movements on MSQ.    Time 6    Period Weeks    Status New      PT LONG TERM GOAL #4   Title Patient will report 50% improvement in dizziness and radiculopathy symptoms with bed mobility and functional activities    Time 6    Period Weeks    Status New                 Plan - 04/17/20 1051    Clinical Impression Statement Initiated gentle and graded manual therapy of cervical spine to decrease mm tension, increase tolerance to manual therapy, and improve spinal mobility with decreased pain.  Pt reported slightly decreased radicular symptoms down RUE, demonstrated improvements in cervical rotation and decrease in dizziness by end of session.  Will continue to progress towards  LTG.    Personal Factors and Comorbidities Age;Comorbidity 3+;Time since onset of injury/illness/exacerbation    Comorbidities Advanced degenerative disc disease at L5-S1.    Examination-Activity Limitations Locomotion Level;Reach Overhead    Examination-Participation Restrictions Driving;Cleaning;Interpersonal Relationship    Stability/Clinical Decision Making Evolving/Moderate complexity  Rehab Potential Good    PT Frequency 2x / week    PT Duration 6 weeks    PT Treatment/Interventions ADLs/Self Care Home Management;Canalith Repostioning;Electrical Stimulation;Cryotherapy;Moist Heat;Traction;Gait training;Stair training;Functional mobility training;Therapeutic activities;Therapeutic exercise;Balance training;Neuromuscular re-education;Patient/family education;Manual techniques;Passive range of motion;Dry needling;Vestibular;Spinal Manipulations;Joint Manipulations    PT Next Visit Plan Joint Position Error. Assess M-CTSIB for Balance. Initiate HEP focused on Cervical ROM, habituation.  Cervical and thoracic manual therapy.  Revisit dry needling possibiltiy after MRI.  Once neck ROM improves, recheck for BPPV if dizziness continues.    Consulted and Agree with Plan of Care Patient;Family member/caregiver    Family Member Consulted Daughter           Patient will benefit from skilled therapeutic intervention in order to improve the following deficits and impairments:  Decreased range of motion,Increased muscle spasms,Dizziness,Decreased activity tolerance,Pain,Postural dysfunction,Impaired sensation,Decreased strength,Impaired flexibility,Hypomobility  Visit Diagnosis: Cervicalgia  Dizziness and giddiness     Problem List Patient Active Problem List   Diagnosis Date Noted  . Essential hypertension 01/23/2015  . Advanced care planning/counseling discussion 04/08/2014  . Memory loss or impairment 11/20/2013  . Mild cognitive impairment 07/17/2013  . Smoking history 07/17/2013  .  Vitamin D deficiency 11/13/2012  . Diabetic neuropathy (HCC) 11/13/2012  . DM type 2 with diabetic peripheral neuropathy (HCC) 11/13/2012  . Benign hypertensive heart disease without heart failure 11/13/2012  . Hyperlipidemia LDL goal <100 11/13/2012    Dierdre Highman, PT, DPT 04/17/20    10:53 AM    [place] [facility] [address] [place], [place], [address] Phone: [phone]   Fax:  [phone]  Name: Danny Winters MRN: [id number] Date of Birth: 1945/06/14

## 2020-04-19 ENCOUNTER — Ambulatory Visit
Admission: RE | Admit: 2020-04-19 | Discharge: 2020-04-19 | Disposition: A | Discharge status: Home / Self Care | Payer: Medicare HMO | Source: Ambulatory Visit | Attending: Neurology | Admitting: Neurology

## 2020-04-19 ENCOUNTER — Other Ambulatory Visit: Payer: Self-pay

## 2020-04-19 DIAGNOSIS — R413 Other amnesia: Secondary | ICD-10-CM

## 2020-04-19 DIAGNOSIS — R011 Cardiac murmur, unspecified: Secondary | ICD-10-CM

## 2020-04-19 DIAGNOSIS — Z8249 Family history of ischemic heart disease and other diseases of the circulatory system: Secondary | ICD-10-CM

## 2020-04-19 DIAGNOSIS — Z818 Family history of other mental and behavioral disorders: Secondary | ICD-10-CM

## 2020-04-19 DIAGNOSIS — H81399 Other peripheral vertigo, unspecified ear: Secondary | ICD-10-CM

## 2020-04-19 DIAGNOSIS — R42 Dizziness and giddiness: Secondary | ICD-10-CM

## 2020-04-19 MED ORDER — GADOBENATE DIMEGLUMINE 529 MG/ML IV SOLN: 17.0000 mL | Freq: Once | INTRAVENOUS | Status: DC | PRN

## 2020-04-19 MED ORDER — GADOBENATE DIMEGLUMINE 529 MG/ML IV SOLN
17.0000 mL | Freq: Once | INTRAVENOUS | Status: AC | PRN
  Administered 2020-04-19: 17 mL via INTRAVENOUS

## 2020-04-20 ENCOUNTER — Ambulatory Visit: Payer: Medicare HMO

## 2020-04-20 DIAGNOSIS — R42 Dizziness and giddiness: Secondary | ICD-10-CM | POA: Diagnosis not present

## 2020-04-20 DIAGNOSIS — M542 Cervicalgia: Secondary | ICD-10-CM | POA: Diagnosis not present

## 2020-04-20 DIAGNOSIS — R293 Abnormal posture: Secondary | ICD-10-CM | POA: Diagnosis not present

## 2020-04-20 DIAGNOSIS — R2681 Unsteadiness on feet: Secondary | ICD-10-CM

## 2020-04-20 NOTE — Therapy (Signed)
[facility] [address] [place], [place], [address] Phone: [phone]   Fax:  [phone]  Physical Therapy Treatment  Patient Details  Name: Danny Winters MRN: [id number] Date of Birth: 06/16/1945 Referring Provider (PT): Abbey Chatters, NP   Encounter Date: 04/20/2020   PT End of Session - 04/20/20 0828    Visit Number 4    Number of Visits 13    Date for PT Re-Evaluation 06/05/20   POC for 6 weeks, Cert for 60 days   Authorization Type [employer] (10th Visit PN) - approved for 13 visits from 04/06/20 - 06/05/20    Progress Note Due on Visit 10    PT Start Time 0829    PT Stop Time 0915    PT Time Calculation (min) 46 min    Activity Tolerance Patient tolerated treatment well    Behavior During Therapy [facility] for tasks assessed/performed           Past Medical History:  Diagnosis Date  . GERD (gastroesophageal reflux disease)   . H/O degenerative disc disease   . Lumbago   . Memory loss   . Other and unspecified hyperlipidemia   . Type II or unspecified type diabetes mellitus without mention of complication, uncontrolled   . Unspecified arthropathy, shoulder region   . Unspecified essential hypertension   . Unspecified hereditary and idiopathic peripheral neuropathy   . Unspecified vitamin D deficiency     Past Surgical History:  Procedure Laterality Date  . COLONOSCOPY  2005   Dr Worthy Flank Medoff  . COLONOSCOPY W/ POLYPECTOMY  2012   [facility]- sessile polyp in transverse colon 4 mm and another 6 mm  . INGUINAL HERNIA REPAIR  2019  . KIDNEY STONE SURGERY  2013   Dr Ihor Gully    There were no vitals filed for this visit.   Subjective Assessment - 04/20/20 0831    Subjective Patient reports he starts to feel better but continues to come back after a day or two. Had MRI yesterday but has not got the results yet.    Patient is accompained by: Family member   Daughter   Pertinent History  GERD, Memory Loss, Lumbago, DM Type 2, HTN, HLD, Peripheral Neuropathy    Limitations Walking    Diagnostic tests Cervical X-Ray: Multilevel mild degenerative disc disease. Lumbar X-ray: Advanced degenerative disc disease at L5-S1. Patient scheduled to have MRI.    Patient Stated Goals resolve dizziness    Currently in Pain? Yes    Pain Score 8     Pain Location Neck    Pain Orientation Left;Right    Pain Descriptors / Indicators Discomfort    Pain Type Chronic pain    Pain Radiating Towards down into the RUE              [facility] Adult PT Treatment/Exercise - 04/20/20 0001      Self-Care   Self-Care Heat/Ice Application    Heat/Ice Application Patient requesting advice about tens/gel for pain. PT educating to try heat application as this allow for improvements in pain as well as reduced tension. Patient and daughter verbalize understanding and plan to purhcase heating pad.      Neuro Re-ed    Neuro Re-ed Details  Cmpleted M-CTSIB, patient able to hold situation 1-3 for full 30 seconds, situation 4 for 20 seconds. Increased sway noted with situation 2 and 4.      Exercises   Exercises Neck  Neck Exercises: Seated   Other Seated Exercise Completed upper trap stretch 2 x 30 seconds each direction, increased stretch noted on R. Verbal/tactile cues for proper completion.    Other Seated Exercise Completed cervical rotation stretch with towel, 5 x 15 seconds each direction, increased cues required for proper completion.      Manual Therapy   Soft tissue mobilization STM to left and right upper trapezius with manual trigger point release and elongation of upper trap progressively stretching into patients tolerance. Completed suboocipital release with graded pressure to patient tolerance x 4 minutes with gentle traction.    Passive ROM Performed PROM into cervical rotation to L and R, as well as sidebending L and R with patient in supine with gentle traction x 3 minutes, working toward  improvements in ROM.          Completed all of the following and established initial HEP focused on cervical ROM/Balance. See Medbridge program for details.  Access Code: [email or web address] URL: [email or web address] Date: 04/20/2020 Prepared by: Jethro Bastos  Exercises Romberg Stance Eyes Closed on Foam Pad - 1 x daily - 5 x weekly - 1 sets - 3 reps - 30 hold Seated Assisted Cervical Rotation with Towel - 1 x daily - 5 x weekly - 1 sets - 10 reps - 15 hold Seated Upper Trapezius Stretch - 1 x daily - 5 x weekly - 1 sets - 3 reps - 30 hold     PT Education - 04/20/20 0919    Education Details application of heat for pain/tension; initial HEP focused on cervical ROM/Balance    Person(s) Educated Patient;Child(ren)    Methods Explanation;Demonstration;Handout    Comprehension Verbalized understanding;Returned demonstration;Verbal cues required;Tactile cues required;Need further instruction            PT Short Term Goals - 04/14/20 0959      PT SHORT TERM GOAL #1   Title Patient will be independent with initial HEP for Vestibular and ROM/Strengthening (ALL STGs Due: 04/27/20)    Baseline no HEP established    Time 3    Period Weeks    Status New    Target Date 04/27/20      PT SHORT TERM GOAL #2   Title Further vestibular (DVA/MSQ/Convergence) and cervical involvement to be assesed and LTG to be set as appropriate.    Time 3    Period Weeks    Status Achieved             PT Long Term Goals - 04/14/20 0959      PT LONG TERM GOAL #1   Title Patient will be independent with Final HEP for Vestibular/ROM/Strengthening (ALL LTGs Due: 05/18/20)    Baseline no HEP established    Time 6    Period Weeks    Status New      PT LONG TERM GOAL #2   Title Pt will demonstrate 10 degree improvement in pain free cervical spine extension, bilat lateral flexion and rotation to R.    Baseline see flowsheets    Time 6    Period Weeks    Status Revised      PT LONG TERM  GOAL #3   Title Pt will report 0/5 for all movements on MSQ.    Time 6    Period Weeks    Status New      PT LONG TERM GOAL #4   Title Patient will report 50% improvement in dizziness and radiculopathy symptoms with bed mobility and functional  activities    Time 6    Period Weeks    Status New                 Plan - 04/20/20 8469    Clinical Impression Statement Completed M-CTSIB with patient demonstrating increased challenge with vision removed on complaint surface demo decreased vestibular input. Rest of session spent establishing initial HEP focused on cervical ROM/stretching and balance with patient toleraitng well. Continued manual therapy focused on ROM and improved tolerance with reduced pain noted. Will continue per POC and progress toward all LTGs.    Personal Factors and Comorbidities Age;Comorbidity 3+;Time since onset of injury/illness/exacerbation    Comorbidities Advanced degenerative disc disease at L5-S1.    Examination-Activity Limitations Locomotion Level;Reach Overhead    Examination-Participation Restrictions Driving;Cleaning;Interpersonal Relationship    Stability/Clinical Decision Making Evolving/Moderate complexity    Rehab Potential Good    PT Frequency 2x / week    PT Duration 6 weeks    PT Treatment/Interventions ADLs/Self Care Home Management;Canalith Repostioning;Electrical Stimulation;Cryotherapy;Moist Heat;Traction;Gait training;Stair training;Functional mobility training;Therapeutic activities;Therapeutic exercise;Balance training;Neuromuscular re-education;Patient/family education;Manual techniques;Passive range of motion;Dry needling;Vestibular;Spinal Manipulations;Joint Manipulations    PT Next Visit Plan Joint Position Error. How was exercises? continue exercises focused on cervical ROM/habituation.  Cervical and thoracic manual therapy.  Revisit dry needling possibiltiy after MRI.  Once neck ROM improves, recheck for BPPV if dizziness continues.     Consulted and Agree with Plan of Care Patient;Family member/caregiver    Family Member Consulted Daughter           Patient will benefit from skilled therapeutic intervention in order to improve the following deficits and impairments:  Decreased range of motion,Increased muscle spasms,Dizziness,Decreased activity tolerance,Pain,Postural dysfunction,Impaired sensation,Decreased strength,Impaired flexibility,Hypomobility  Visit Diagnosis: Cervicalgia  Dizziness and giddiness  Unsteadiness on feet  Abnormal posture     Problem List Patient Active Problem List   Diagnosis Date Noted  . Essential hypertension 01/23/2015  . Advanced care planning/counseling discussion 04/08/2014  . Memory loss or impairment 11/20/2013  . Mild cognitive impairment 07/17/2013  . Smoking history 07/17/2013  . Vitamin D deficiency 11/13/2012  . Diabetic neuropathy (HCC) 11/13/2012  . DM type 2 with diabetic peripheral neuropathy (HCC) 11/13/2012  . Benign hypertensive heart disease without heart failure 11/13/2012  . Hyperlipidemia LDL goal <100 11/13/2012    Tempie Donning, PT, DPT 04/20/2020, 9:26 AM  [facility] [address] [place], [place], [address] Phone: [phone]   Fax:  [phone]  Name: Danny Winters MRN: [id number] Date of Birth: Feb 17, 1945

## 2020-04-20 NOTE — Patient Instructions (Signed)
Access Code: [email or web address] URL: [email or web address] Date: 04/20/2020 Prepared by: Jethro Bastos  Exercises Romberg Stance Eyes Closed on Foam Pad - 1 x daily - 5 x weekly - 1 sets - 3 reps - 30 hold Seated Assisted Cervical Rotation with Towel - 1 x daily - 5 x weekly - 1 sets - 10 reps - 15 hold Seated Upper Trapezius Stretch - 1 x daily - 5 x weekly - 1 sets - 3 reps - 30 hold

## 2020-04-20 NOTE — Progress Notes (Signed)
Please call patient and advise him that his brain MRI with and without contrast showed no acute findings and chronic, age-appropriate findings.  Normal contrast uptake also.  His MR angiogram of the neck with and without contrast showed no large blood vessel stenosis, meaning blockage and was reported as normal.  He can follow-up as scheduled.

## 2020-04-21 ENCOUNTER — Encounter: Payer: Self-pay | Admitting: Nurse Practitioner

## 2020-04-21 MED ORDER — MECLIZINE HCL 12.5 MG PO TABS: 12.5000 mg | ORAL_TABLET | Freq: Three times a day (TID) | ORAL | 1 refills | Status: DC | PRN

## 2020-04-21 NOTE — Telephone Encounter (Signed)
Message routed to Sharon Seller, NP to review and reply to patient.

## 2020-04-22 ENCOUNTER — Encounter: Payer: Self-pay | Admitting: Nurse Practitioner

## 2020-04-22 ENCOUNTER — Telehealth: Payer: Self-pay

## 2020-04-22 NOTE — Telephone Encounter (Signed)
I tried calling patient and his daughter, Natalia Leatherwood, to go over MRI/MRA results. There was no answer at either number and voicemail is not set up.

## 2020-04-22 NOTE — Telephone Encounter (Signed)
-----   Message from Huston Foley, MD sent at 04/20/2020  4:10 PM EDT ----- Please call patient and advise him that his brain MRI with and without contrast showed no acute findings and chronic, age-appropriate findings.  Normal contrast uptake also.  His MR angiogram of the neck with and without contrast showed no large blood vessel stenosis, meaning blockage and was reported as normal.  He can follow-up as scheduled.

## 2020-04-23 ENCOUNTER — Telehealth: Payer: Self-pay

## 2020-04-23 NOTE — Telephone Encounter (Signed)
Message sent to Eubanks, Jessica K, NP to review and reply to patient  

## 2020-04-23 NOTE — Telephone Encounter (Signed)
I called the pt's daughter Duwayne Heck) and advised of results. She verbalized understanding and will advise the pt of the result.

## 2020-04-23 NOTE — Telephone Encounter (Signed)
-----   Message from Saima Athar, MD sent at 04/20/2020  4:10 PM EDT ----- Please call patient and advise him that his brain MRI with and without contrast showed no acute findings and chronic, age-appropriate findings.  Normal contrast uptake also.  His MR angiogram of the neck with and without contrast showed no large blood vessel stenosis, meaning blockage and was reported as normal.  He can follow-up as scheduled. 

## 2020-04-24 ENCOUNTER — Ambulatory Visit: Payer: Medicare HMO | Attending: Nurse Practitioner

## 2020-04-24 ENCOUNTER — Other Ambulatory Visit: Payer: Self-pay

## 2020-04-24 DIAGNOSIS — M542 Cervicalgia: Secondary | ICD-10-CM | POA: Diagnosis not present

## 2020-04-24 DIAGNOSIS — R2681 Unsteadiness on feet: Secondary | ICD-10-CM | POA: Insufficient documentation

## 2020-04-24 DIAGNOSIS — R293 Abnormal posture: Secondary | ICD-10-CM | POA: Insufficient documentation

## 2020-04-24 DIAGNOSIS — R42 Dizziness and giddiness: Secondary | ICD-10-CM | POA: Diagnosis not present

## 2020-04-24 NOTE — Therapy (Signed)
[facility] [address] [place], [place], [address] Phone: [phone]   Fax:  [phone]  Physical Therapy Treatment  Patient Details  Name: Danny Winters MRN: [id number] Date of Birth: August 06, 1945 Referring Provider (PT): Abbey Chatters, NP   Encounter Date: 04/24/2020   PT End of Session - 04/24/20 0940    Visit Number 5    Number of Visits 13    Date for PT Re-Evaluation 06/05/20   POC for 6 weeks, Cert for 60 days   Authorization Type [employer] (10th Visit PN) - approved for 13 visits from 04/06/20 - 06/05/20    Progress Note Due on Visit 10    PT Start Time [phone]   patient arrived late   PT Stop Time 1015    PT Time Calculation (min) 36 min    Activity Tolerance Patient tolerated treatment well    Behavior During Therapy [facility] for tasks assessed/performed           Past Medical History:  Diagnosis Date  . GERD (gastroesophageal reflux disease)   . H/O degenerative disc disease   . Lumbago   . Memory loss   . Other and unspecified hyperlipidemia   . Type II or unspecified type diabetes mellitus without mention of complication, uncontrolled   . Unspecified arthropathy, shoulder region   . Unspecified essential hypertension   . Unspecified hereditary and idiopathic peripheral neuropathy   . Unspecified vitamin D deficiency     Past Surgical History:  Procedure Laterality Date  . COLONOSCOPY  2005   Dr Worthy Flank Medoff  . COLONOSCOPY W/ POLYPECTOMY  2012   [facility]- sessile polyp in transverse colon 4 mm and another 6 mm  . INGUINAL HERNIA REPAIR  2019  . KIDNEY STONE SURGERY  2013   Dr Ihor Gully    There were no vitals filed for this visit.   Subjective Assessment - 04/24/20 0941    Subjective Patient and daughter reports that MRI come back normal, just age related changes. Patient reports feels like he has an ear infection, is going to be seen by PCP. reports increased dizziness this  morning, took a meclizine. Reports currently dizziness is 9/10.    Patient is accompained by: Family member   Daughter   Pertinent History GERD, Memory Loss, Lumbago, DM Type 2, HTN, HLD, Peripheral Neuropathy    Limitations Walking    Diagnostic tests Cervical X-Ray: Multilevel mild degenerative disc disease. Lumbar X-ray: Advanced degenerative disc disease at L5-S1. Patient scheduled to have MRI.    Patient Stated Goals resolve dizziness    Currently in Pain? Yes    Pain Score 4     Pain Location Neck    Pain Orientation Left;Right    Pain Descriptors / Indicators Discomfort;Sore    Pain Type Chronic pain              [facility] Adult PT Treatment/Exercise - 04/24/20 0001      Transfers   Transfers Sit to Stand;Stand to Sit    Sit to Stand 5: Supervision    Stand to Sit 5: Supervision      Ambulation/Gait   Ambulation/Gait Yes    Ambulation/Gait Assistance 5: Supervision    Ambulation/Gait Assistance Details into/out of therapy session    Ambulation Distance (Feet) --   clinic distance   Assistive device None    Gait Pattern Step-through pattern    Ambulation Surface Level;Indoor      Self-Care   Self-Care Other  Self-Care Comments    Heat/Ice Application Patient/daughter reporting increased anxiety over last few days due to symptoms. PT educating on techniques for improved relaxation. Patient reports that he enjoys music, PT educating on laying supine with music playing and engaging in relaxation techniques to promote reduced symptoms and tension. PT educating to complete 10-15 minutes at a time as needed, may also add in heating pad to further reduce neck tension.      Exercises   Exercises Neck;Other Exercises    Other Exercises  PT educating and completion self mobilization with tennis ball, with PT demonstrating and patient returning demonstration to show proper completion for self management of tension outside of therapy session. Pt verbalized understanding, PT provided  tennis ball for patient to allow for completion at home.      Neck Exercises: Seated   Other Seated Exercise Completed scapular retraction x 5 reps, with tactile/verbal cues for technique.      Manual Therapy   Manual Therapy Joint mobilization;Soft tissue mobilization;Passive ROM    Joint Mobilization with patient prone: completed grade 1 and 2 AP unilateral and central mobilization to lower cervical and thoracic spine,  8 x bouts of 15-20 seconds for reduction in pain and improved tolerance for manual therapy. Patient having increased tenderness noted on R side, overall hypomobile throughout thoracic spine.    Soft tissue mobilization Compelted STM to B rhomboids and B Upper trap, completed STM x 5 minutes with PT providing pressure to patient's tolerance on trigger points noted in rhomboids for    Passive ROM completed PROM into lateral flexion B, and cervical rotation B with PT providing stabilizatoin to shoulder on ipsilateral side to promote improved ROM and stretch. Completed x 2 minutes                  PT Education - 04/24/20 1152    Education Details see self care    Person(s) Educated Patient;Child(ren)    Methods Explanation    Comprehension Verbalized understanding            PT Short Term Goals - 04/14/20 0959      PT SHORT TERM GOAL #1   Title Patient will be independent with initial HEP for Vestibular and ROM/Strengthening (ALL STGs Due: 04/27/20)    Baseline no HEP established    Time 3    Period Weeks    Status New    Target Date 04/27/20      PT SHORT TERM GOAL #2   Title Further vestibular (DVA/MSQ/Convergence) and cervical involvement to be assesed and LTG to be set as appropriate.    Time 3    Period Weeks    Status Achieved             PT Long Term Goals - 04/14/20 0959      PT LONG TERM GOAL #1   Title Patient will be independent with Final HEP for Vestibular/ROM/Strengthening (ALL LTGs Due: 05/18/20)    Baseline no HEP established    Time  6    Period Weeks    Status New      PT LONG TERM GOAL #2   Title Pt will demonstrate 10 degree improvement in pain free cervical spine extension, bilat lateral flexion and rotation to R.    Baseline see flowsheets    Time 6    Period Weeks    Status Revised      PT LONG TERM GOAL #3   Title Pt will report 0/5 for  all movements on MSQ.    Time 6    Period Weeks    Status New      PT LONG TERM GOAL #4   Title Patient will report 50% improvement in dizziness and radiculopathy symptoms with bed mobility and functional activities    Time 6    Period Weeks    Status New                 Plan - 04/24/20 1153    Clinical Impression Statement Due to increased anxiety reported recently, PT spending time to educate patient on engaging and completing activites for improved relaxation to improve symptoms and reduce tension. Continued manual therapy to cervical/thoracic region to promote reduce tension and improved mobility. PT educating and demonstrating on use of tennis ball to promote reduced tension. Will continue to progress toward all LTGs.    Personal Factors and Comorbidities Age;Comorbidity 3+;Time since onset of injury/illness/exacerbation    Comorbidities Advanced degenerative disc disease at L5-S1.    Examination-Activity Limitations Locomotion Level;Reach Overhead    Examination-Participation Restrictions Driving;Cleaning;Interpersonal Relationship    Stability/Clinical Decision Making Evolving/Moderate complexity    Rehab Potential Good    PT Frequency 2x / week    PT Duration 6 weeks    PT Treatment/Interventions ADLs/Self Care Home Management;Canalith Repostioning;Electrical Stimulation;Cryotherapy;Moist Heat;Traction;Gait training;Stair training;Functional mobility training;Therapeutic activities;Therapeutic exercise;Balance training;Neuromuscular re-education;Patient/family education;Manual techniques;Passive range of motion;Dry needling;Vestibular;Spinal  Manipulations;Joint Manipulations    PT Next Visit Plan continue exercises focused on cervical ROM/habituation.  Cervical and thoracic manual therapy.  Wants to try dry needling.  Once neck ROM improves, recheck for BPPV if dizziness continues.    Consulted and Agree with Plan of Care Patient;Family member/caregiver    Family Member Consulted Daughter           Patient will benefit from skilled therapeutic intervention in order to improve the following deficits and impairments:  Decreased range of motion,Increased muscle spasms,Dizziness,Decreased activity tolerance,Pain,Postural dysfunction,Impaired sensation,Decreased strength,Impaired flexibility,Hypomobility  Visit Diagnosis: Cervicalgia  Dizziness and giddiness  Unsteadiness on feet  Abnormal posture     Problem List Patient Active Problem List   Diagnosis Date Noted  . Essential hypertension 01/23/2015  . Advanced care planning/counseling discussion 04/08/2014  . Memory loss or impairment 11/20/2013  . Mild cognitive impairment 07/17/2013  . Smoking history 07/17/2013  . Vitamin D deficiency 11/13/2012  . Diabetic neuropathy (HCC) 11/13/2012  . DM type 2 with diabetic peripheral neuropathy (HCC) 11/13/2012  . Benign hypertensive heart disease without heart failure 11/13/2012  . Hyperlipidemia LDL goal <100 11/13/2012    Tempie Donning, PT, DPT 04/24/2020, 12:03 PM  [facility] [address] [place], [place], [address] Phone: [phone]   Fax:  [phone]  Name: Danny Winters MRN: [id number] Date of Birth: 13-Mar-1945

## 2020-04-26 ENCOUNTER — Other Ambulatory Visit: Payer: Self-pay | Admitting: Nurse Practitioner

## 2020-04-26 DIAGNOSIS — E785 Hyperlipidemia, unspecified: Secondary | ICD-10-CM

## 2020-04-27 ENCOUNTER — Ambulatory Visit: Payer: Medicare HMO

## 2020-04-29 ENCOUNTER — Other Ambulatory Visit: Payer: Self-pay | Admitting: Nurse Practitioner

## 2020-04-29 ENCOUNTER — Encounter: Payer: Self-pay | Admitting: Nurse Practitioner

## 2020-04-29 ENCOUNTER — Ambulatory Visit (INDEPENDENT_AMBULATORY_CARE_PROVIDER_SITE_OTHER): Payer: Medicare HMO | Admitting: Nurse Practitioner

## 2020-04-29 ENCOUNTER — Other Ambulatory Visit: Payer: Self-pay

## 2020-04-29 VITALS — BP 136/82 | HR 80 | Temp 97.1°F | Ht 69.0 in | Wt 157.0 lb

## 2020-04-29 DIAGNOSIS — E538 Deficiency of other specified B group vitamins: Secondary | ICD-10-CM | POA: Diagnosis not present

## 2020-04-29 DIAGNOSIS — E1142 Type 2 diabetes mellitus with diabetic polyneuropathy: Secondary | ICD-10-CM

## 2020-04-29 DIAGNOSIS — R413 Other amnesia: Secondary | ICD-10-CM | POA: Diagnosis not present

## 2020-04-29 DIAGNOSIS — E559 Vitamin D deficiency, unspecified: Secondary | ICD-10-CM

## 2020-04-29 DIAGNOSIS — R011 Cardiac murmur, unspecified: Secondary | ICD-10-CM | POA: Diagnosis not present

## 2020-04-29 DIAGNOSIS — M542 Cervicalgia: Secondary | ICD-10-CM

## 2020-04-29 DIAGNOSIS — R42 Dizziness and giddiness: Secondary | ICD-10-CM

## 2020-04-29 DIAGNOSIS — H6121 Impacted cerumen, right ear: Secondary | ICD-10-CM | POA: Diagnosis not present

## 2020-04-29 DIAGNOSIS — F419 Anxiety disorder, unspecified: Secondary | ICD-10-CM | POA: Diagnosis not present

## 2020-04-29 DIAGNOSIS — I10 Essential (primary) hypertension: Secondary | ICD-10-CM

## 2020-04-29 DIAGNOSIS — E785 Hyperlipidemia, unspecified: Secondary | ICD-10-CM

## 2020-04-29 MED ORDER — SERTRALINE HCL 25 MG PO TABS: 25.0000 mg | ORAL_TABLET | Freq: Every day | ORAL | 3 refills | Status: DC

## 2020-04-29 MED ORDER — SERTRALINE HCL 25 MG PO TABS: 25.0000 mg | ORAL_TABLET | Freq: Every day | ORAL | 0 refills | Status: DC

## 2020-04-29 NOTE — Progress Notes (Signed)
Careteam: Patient Care Team: Sharon Seller, NP as PCP - General (Geriatric Medicine) Jethro Bolus, MD as Consulting Physician (Ophthalmology)  PLACE OF SERVICE:  [facility]  Advanced Directive information Does Patient Have a Medical Advance Directive?: No, Does patient want to make changes to medical advance directive?: Yes (MAU/Ambulatory/Procedural Areas - Information given)  Allergies  Allergen Reactions  . Lisinopril Itching    Chief Complaint  Patient presents with  . Medical Management of Chronic Issues    6 month follow-up and discuss mood. Discuss if patient should be on vit C and meclizine (not effective). Patient c/o ear issues, possible ear infection. Here with Clydie Braun      HPI: Patient is a 75 y.o. male for routine follow up.   Ongoing dizziness- taking meclizine 25 mg daily.   Feels like he is getting an ear infection having roaring in his ears- wax in there. Occasional pain. No drainage.   He is going to PT for back pain and dizziness.  Muscles are tense - want to do dry needling.   b12- taking supplement at this time  Dm- on januvia and metformin  Constipation- bowels not moving like they should.  Eats prune or salad which helps a lot. Eating a lot of fast food and needs to get on vegetables.    Review of Systems:  Review of Systems  Constitutional: Negative for chills, fever and weight loss.  HENT: Positive for ear pain. Negative for ear discharge and tinnitus.   Respiratory: Negative for cough, sputum production and shortness of breath.   Cardiovascular: Negative for chest pain, palpitations and leg swelling.  Gastrointestinal: Negative for abdominal pain, constipation, diarrhea and heartburn.  Genitourinary: Negative for dysuria, frequency and urgency.  Musculoskeletal: Negative for back pain, falls, joint pain and myalgias.  Skin: Negative.   Neurological: Positive for dizziness. Negative for headaches.  Psychiatric/Behavioral: Negative  for depression and memory loss. The patient is nervous/anxious. The patient does not have insomnia.     Past Medical History:  Diagnosis Date  . GERD (gastroesophageal reflux disease)   . H/O degenerative disc disease   . Lumbago   . Memory loss   . Other and unspecified hyperlipidemia   . Type II or unspecified type diabetes mellitus without mention of complication, uncontrolled   . Unspecified arthropathy, shoulder region   . Unspecified essential hypertension   . Unspecified hereditary and idiopathic peripheral neuropathy   . Unspecified vitamin D deficiency    Past Surgical History:  Procedure Laterality Date  . COLONOSCOPY  2005   Dr Worthy Flank Medoff  . COLONOSCOPY W/ POLYPECTOMY  2012   [facility]- sessile polyp in transverse colon 4 mm and another 6 mm  . INGUINAL HERNIA REPAIR  2019  . KIDNEY STONE SURGERY  2013   Dr Ihor Gully   Social History:   reports that he quit smoking about 46 years ago. His smoking use included cigarettes. He has a 11.00 pack-year smoking history. He has never used smokeless tobacco. He reports previous alcohol use. He reports that he does not use drugs.  Family History  Problem Relation Age of Onset  . Heart disease Mother   . Heart attack Father   . Cancer Sister        colon  . Cancer Brother        colon  . Cancer Brother        colon  . Cancer Brother        prostate  .  Cancer Brother        prostate  . Cancer Brother        Pancreas  . Stroke Other     Medications: Patient's Medications  New Prescriptions   No medications on file  Previous Medications   ACCU-CHEK AVIVA PLUS TEST STRIP    USE TO CHECK BLOOD SUGAR TWICE DAILY.   ACCU-CHEK SOFTCLIX LANCETS LANCETS    USE TO TEST BLOOD SUGAR TWICE DAILY.   ALCOHOL SWABS (B-D SINGLE USE SWABS REGULAR) PADS    USE DAILY AS DIRECTED WHEN CHECKING BLOOD SUGAR   ASPIRIN 81 MG TABLET    Take 81 mg by mouth daily.   CHOLECALCIFEROL (VITAMIN D3) 50 MCG (2000 UT) TABS    Take 1  tablet by mouth daily. To start after a 12-week course of 50,000 units weekly completed.   DICLOFENAC SODIUM (VOLTAREN) 1 % GEL    Apply 2 g topically 4 (four) times daily.   FLUTICASONE (FLONASE) 50 MCG/ACT NASAL SPRAY    Place 2 sprays into both nostrils daily.   GABAPENTIN (NEURONTIN) 100 MG CAPSULE    Take 1 capsule (100 mg total) by mouth at bedtime.   IBUPROFEN (ADVIL) 800 MG TABLET    Take 0.5-1 tablets (400-800 mg total) by mouth every 8 (eight) hours as needed.   IRBESARTAN (AVAPRO) 75 MG TABLET    TAKE 1 TABLET BY MOUTH EVERY DAY   JANUVIA 100 MG TABLET    TAKE 1 TABLET BY MOUTH EVERY DAY   MECLIZINE (ANTIVERT) 12.5 MG TABLET    Take 1-2 tablets (12.5-25 mg total) by mouth 3 (three) times daily as needed for dizziness.   METFORMIN (GLUCOPHAGE) 1000 MG TABLET    TAKE 1 TABLET BY MOUTH TWICE A DAY FOR BLOOD SUGAR   OMEPRAZOLE (PRILOSEC) 20 MG CAPSULE    Take 1 capsule (20 mg total) by mouth daily.   SIMVASTATIN (ZOCOR) 10 MG TABLET    TAKE 1 TABLET (10 MG TOTAL) BY MOUTH DAILY AT 6 PM.   TIZANIDINE (ZANAFLEX) 2 MG TABLET    Take 1-2 tablets (2-4 mg total) by mouth every 6 (six) hours as needed for muscle spasms.   VITAMIN C (ASCORBIC ACID) 500 MG TABLET    Take 500 mg by mouth daily.   VITAMIN D, ERGOCALCIFEROL, (DRISDOL) 1.25 MG (50000 UNIT) CAPS CAPSULE    Take 1 capsule (50,000 Units total) by mouth every 7 (seven) days.  Modified Medications   No medications on file  Discontinued Medications   No medications on file    Physical Exam:  Vitals:   04/29/20 0827  BP: 136/82  Pulse: 80  Temp: (!) 97.1 F (36.2 C)  TempSrc: Temporal  SpO2: 99%  Weight: 157 lb (71.2 kg)  Height: 5\' 9"  (1.753 m)   Body mass index is 23.18 kg/m. Wt Readings from Last 3 Encounters:  04/29/20 157 lb (71.2 kg)  03/26/20 160 lb (72.6 kg)  03/23/20 159 lb (72.1 kg)    Physical Exam Constitutional:      General: He is not in acute distress.    Appearance: He is well-developed. He is not  diaphoretic.  HENT:     Head: Normocephalic and atraumatic.     Right Ear: There is impacted cerumen.     Mouth/Throat:     Pharynx: No oropharyngeal exudate.  Eyes:     Conjunctiva/sclera: Conjunctivae normal.     Pupils: Pupils are equal, round, and reactive to light.  Cardiovascular:  Rate and Rhythm: Normal rate and regular rhythm.     Heart sounds: Normal heart sounds.  Pulmonary:     Effort: Pulmonary effort is normal.     Breath sounds: Normal breath sounds.  Abdominal:     General: Bowel sounds are normal.     Palpations: Abdomen is soft.  Musculoskeletal:        General: No tenderness.     Cervical back: Normal range of motion and neck supple.  Skin:    General: Skin is warm and dry.  Neurological:     Mental Status: He is alert and oriented to person, place, and time.  Psychiatric:        Mood and Affect: Mood normal.     Labs reviewed: Basic Metabolic Panel: Recent Labs    04/30/19 1042 10/30/19 1036 02/24/20 1458 03/23/20 1114  NA 137 138 136 138  K 4.3 4.2 4.4 4.2  CL 102 104 100 100  CO2 29 27 29 24   GLUCOSE 125* 116* 84 100*  BUN 12 11 13 18   CREATININE 1.09 1.17 1.12 1.10  CALCIUM 9.6 9.7 9.7 9.7  TSH 1.07  --   --  1.370   Liver Function Tests: Recent Labs    04/30/19 1042 10/30/19 1036 03/23/20 1114  AST 18 18 20   ALT 13 9 7   ALKPHOS  --   --  58  BILITOT 1.0 1.0 0.9  PROT 7.2 7.1 7.1  ALBUMIN  --   --  4.4   No results for input(s): LIPASE, AMYLASE in the last 8760 hours. No results for input(s): AMMONIA in the last 8760 hours. CBC: Recent Labs    10/30/19 1036 02/24/20 1458 03/18/20 1206  WBC 3.4* 3.5* 5.3  NEUTROABS 1,819 1,642 3,265  HGB 14.5 15.0 16.1  HCT 42.5 44.4 46.6  MCV 87.1 87.1 86.6  PLT 168 173 196   Lipid Panel: Recent Labs    04/30/19 1042  CHOL 125  HDL 40  LDLCALC 71  TRIG 67  CHOLHDL 3.1   TSH: Recent Labs    04/30/19 1042 03/23/20 1114  TSH 1.07 1.370   A1C: Lab Results  Component  Value Date   HGBA1C 7.1 (H) 03/23/2020     Assessment/Plan 1. Dizziness Ongoing, PT is helping symptoms. Some days better than others. -continue on meclizine- okay to increase to 25 mg every 8 hours as needed   2. Vitamin D deficiency -continue on vit d supplement.  - Vitamin D, 25-hydroxy; Future  3. Anxiety -worsening anxiety since dizziness has started.  - sertraline (ZOLOFT) 25 MG tablet; Take 1 tablet (25 mg total) by mouth daily.  Dispense: 30 tablet; Refill: 0  4. Murmur, cardiac -echo scheduled for tomorrow. No chest pains, shortness of breath, LE edema noted.   5. Memory loss or impairment -stable, improved symptoms at this time. Reports dizziness was making   6. Neck pain -stable, has been working with PT with good results.   7. DM type 2 with diabetic peripheral neuropathy (HCC) -encouraged compliance with medication management. Daughters helping him with medications. Encouraged dietary compliance, routine foot care/monitoring and to keep up with diabetic eye exams through ophthalmology  - COMPLETE METABOLIC PANEL WITH GFR; Future - Hemoglobin A1c; Future - CBC with Differential/Platelet; Future  8. B12 deficiency -taking supplement at this time.  - Vitamin B12; Future  9. Right ear impacted cerumen -removed via ear lavage, pt tolerated well.   10. Essential hypertension -controlled on current regimen.  11. Hyperlipidemia LDL goal <100 -continue on zocor with dietary modification -LDL at goal last year, will need follow up prior to next OV - Lipid Panel; Future  Next appt: 3 months. Janene Harvey. Biagio Borg  [facility] [phone]

## 2020-04-29 NOTE — Patient Instructions (Addendum)
Increase meclizine 25 mg by mouth daily  To avoid Advil- use tylenol 500 mg 1-2 tablets ever 8 hours as needed for pain  Increase vegetables and water in diet Avoid fried food Make sure you are eating 3 meals a day Constipation, Adult Constipation is when a person has fewer than three bowel movements in a week, has difficulty having a bowel movement, or has stools (feces) that are dry, hard, or larger than normal. Constipation may be caused by an underlying condition. It may become worse with age if a person takes certain medicines and does not take in enough fluids. Follow these instructions at home: Eating and drinking  Eat foods that have a lot of fiber, such as beans, whole grains, and fresh fruits and vegetables.  Limit foods that are low in fiber and high in fat and processed sugars, such as fried or sweet foods. These include french fries, hamburgers, cookies, candies, and soda.  Drink enough fluid to keep your urine pale yellow.   General instructions  Exercise regularly or as told by your health care provider. Try to do 150 minutes of moderate exercise each week.  Use the bathroom when you have the urge to go. Do not hold it in.  Take over-the-counter and prescription medicines only as told by your health care provider. This includes any fiber supplements.  During bowel movements: ? Practice deep breathing while relaxing the lower abdomen. ? Practice pelvic floor relaxation.  Watch your condition for any changes. Let your health care provider know about them.  Keep all follow-up visits as told by your health care provider. This is important. Contact a health care provider if:  You have pain that gets worse.  You have a fever.  You do not have a bowel movement after 4 days.  You vomit.  You are not hungry or you lose weight.  You are bleeding from the opening between the buttocks (anus).  You have thin, pencil-like stools. Get help right away if:  You have a  fever and your symptoms suddenly get worse.  You leak stool or have blood in your stool.  Your abdomen is bloated.  You have severe pain in your abdomen.  You feel dizzy or you faint. Summary  Constipation is when a person has fewer than three bowel movements in a week, has difficulty having a bowel movement, or has stools (feces) that are dry, hard, or larger than normal.  Eat foods that have a lot of fiber, such as beans, whole grains, and fresh fruits and vegetables.  Drink enough fluid to keep your urine pale yellow.  Take over-the-counter and prescription medicines only as told by your health care provider. This includes any fiber supplements. This information is not intended to replace advice given to you by your health care provider. Make sure you discuss any questions you have with your health care provider. Document Revised: 11/28/2018 Document Reviewed: 11/28/2018 Elsevier Patient Education  2021 [employer].

## 2020-04-30 ENCOUNTER — Ambulatory Visit (HOSPITAL_COMMUNITY): Payer: Medicare HMO | Attending: Internal Medicine

## 2020-04-30 DIAGNOSIS — R011 Cardiac murmur, unspecified: Secondary | ICD-10-CM | POA: Diagnosis not present

## 2020-04-30 LAB — ECHOCARDIOGRAM COMPLETE
Area-P 1/2: 2.82 cm2
S' Lateral: 1.8 cm

## 2020-05-01 ENCOUNTER — Other Ambulatory Visit: Payer: Self-pay

## 2020-05-01 ENCOUNTER — Ambulatory Visit: Payer: Medicare HMO

## 2020-05-01 DIAGNOSIS — R2681 Unsteadiness on feet: Secondary | ICD-10-CM | POA: Diagnosis not present

## 2020-05-01 DIAGNOSIS — R42 Dizziness and giddiness: Secondary | ICD-10-CM

## 2020-05-01 DIAGNOSIS — R293 Abnormal posture: Secondary | ICD-10-CM

## 2020-05-01 DIAGNOSIS — M542 Cervicalgia: Secondary | ICD-10-CM

## 2020-05-01 NOTE — Therapy (Signed)
[facility] [address] [place], [place], [address] Phone: [phone]   Fax:  [phone]  Physical Therapy Treatment  Patient Details  Name: Danny Winters MRN: [id number] Date of Birth: 1945/04/06 Referring Provider (PT): Abbey Chatters, NP   Encounter Date: 05/01/2020   PT End of Session - 05/01/20 0934    Visit Number 6    Number of Visits 13    Date for PT Re-Evaluation 06/05/20   POC for 6 weeks, Cert for 60 days   Authorization Type [employer] (10th Visit PN) - approved for 13 visits from 04/06/20 - 06/05/20    Progress Note Due on Visit 10    PT Start Time 0932    PT Stop Time 1015    PT Time Calculation (min) 43 min    Activity Tolerance Patient tolerated treatment well    Behavior During Therapy [facility] for tasks assessed/performed           Past Medical History:  Diagnosis Date  . GERD (gastroesophageal reflux disease)   . H/O degenerative disc disease   . Lumbago   . Memory loss   . Other and unspecified hyperlipidemia   . Type II or unspecified type diabetes mellitus without mention of complication, uncontrolled   . Unspecified arthropathy, shoulder region   . Unspecified essential hypertension   . Unspecified hereditary and idiopathic peripheral neuropathy   . Unspecified vitamin D deficiency     Past Surgical History:  Procedure Laterality Date  . COLONOSCOPY  2005   Dr Worthy Flank Medoff  . COLONOSCOPY W/ POLYPECTOMY  2012   [facility]- sessile polyp in transverse colon 4 mm and another 6 mm  . INGUINAL HERNIA REPAIR  2019  . KIDNEY STONE SURGERY  2013   Dr Ihor Gully    There were no vitals filed for this visit.   Subjective Assessment - 05/01/20 0936    Subjective Patient reports that he feels like the dizziness is driving him crazy, he cant seem to focus. Repots dizziness is high this morning. Did not go to beach due to the symptoms. Patient reports blood sugar was low this  morning, requested crackers/water to help.    Patient is accompained by: Family member   Daughter   Pertinent History GERD, Memory Loss, Lumbago, DM Type 2, HTN, HLD, Peripheral Neuropathy    Limitations Walking    Diagnostic tests Cervical X-Ray: Multilevel mild degenerative disc disease. Lumbar X-ray: Advanced degenerative disc disease at L5-S1. Patient scheduled to have MRI.    Patient Stated Goals resolve dizziness    Currently in Pain? Yes    Pain Score 8    8.5   Pain Location Neck    Pain Orientation Left;Right    Pain Descriptors / Indicators Sore                             [facility] Adult PT Treatment/Exercise - 05/01/20 0001      Transfers   Transfers Sit to Stand;Stand to Sit    Sit to Stand 5: Supervision    Stand to Sit 5: Supervision      Ambulation/Gait   Ambulation/Gait Yes    Ambulation/Gait Assistance 5: Supervision    Ambulation/Gait Assistance Details in/out of therapy session    Ambulation Distance (Feet) --   clinic distances   Assistive device None    Gait Pattern Step-through pattern    Ambulation Surface Level;Indoor  Neuro Re-ed    Neuro Re-ed Details  In corner: standing on airex with feet apart completd horizontal/vertical head turns 2 x 10 reps each. increased dizziness and balance challenge reported with vertical > horizontal. Completed standing wide BOS with eyes closed x 30 seconds, then progressed to narrow BOS 2 x 30 seconds. reports increased dizziness with vision removed and narrow BOS.      Exercises   Exercises Neck    Other Exercises  PT guided patient through deep breathing exercise, with exhale working toward relaxing B shoulders, and PT providing overpressure into movement for improvements in tension.      Neck Exercises: Theraband   Scapula Retraction 10 reps;Limitations    Scapula Retraction Limitations PT provided tactile cues posteriorly for improved retraction.      Manual Therapy   Manual Therapy Soft tissue  mobilization;Passive ROM    Soft tissue mobilization STM to right and left upper trapezius with manual trigger point release and elongation of upper trap progressively increasing amount of pressure to patient's tolerance. STM to B Rhomboids, progressing pressure to tolerance.    Passive ROM completed passive PROM into lateral flexion in bilateral directions, as well as bilateral rotation. PT providing stabilization to shoulder with lateral flexion to promote improved ROM and reduced compensation via hiking shoulder.              PT Short Term Goals - 05/01/20 1219      PT SHORT TERM GOAL #1   Title Patient will be independent with initial HEP for Vestibular and ROM/Strengthening (ALL STGs Due: 04/27/20)    Baseline reports independence    Time 3    Period Weeks    Status Achieved    Target Date 04/27/20      PT SHORT TERM GOAL #2   Title Further vestibular (DVA/MSQ/Convergence) and cervical involvement to be assesed and LTG to be set as appropriate.    Time 3    Period Weeks    Status Achieved             PT Long Term Goals - 04/14/20 0959      PT LONG TERM GOAL #1   Title Patient will be independent with Final HEP for Vestibular/ROM/Strengthening (ALL LTGs Due: 05/18/20)    Baseline no HEP established    Time 6    Period Weeks    Status New      PT LONG TERM GOAL #2   Title Pt will demonstrate 10 degree improvement in pain free cervical spine extension, bilat lateral flexion and rotation to R.    Baseline see flowsheets    Time 6    Period Weeks    Status Revised      PT LONG TERM GOAL #3   Title Pt will report 0/5 for all movements on MSQ.    Time 6    Period Weeks    Status New      PT LONG TERM GOAL #4   Title Patient will report 50% improvement in dizziness and radiculopathy symptoms with bed mobility and functional activities    Time 6    Period Weeks    Status New                 Plan - 05/01/20 1223    Clinical Impression Statement Continues  to report increased anxiety, completed deep breathing with addition of shoulder activity to promote reduced sympoms. Continued exercises and manual therapy focused on improved ROM and reduced muscle  tension. Will continue to progress toward all LTGs.    Personal Factors and Comorbidities Age;Comorbidity 3+;Time since onset of injury/illness/exacerbation    Comorbidities Advanced degenerative disc disease at L5-S1.    Examination-Activity Limitations Locomotion Level;Reach Overhead    Examination-Participation Restrictions Driving;Cleaning;Interpersonal Relationship    Stability/Clinical Decision Making Evolving/Moderate complexity    Rehab Potential Good    PT Frequency 2x / week    PT Duration 6 weeks    PT Treatment/Interventions ADLs/Self Care Home Management;Canalith Repostioning;Electrical Stimulation;Cryotherapy;Moist Heat;Traction;Gait training;Stair training;Functional mobility training;Therapeutic activities;Therapeutic exercise;Balance training;Neuromuscular re-education;Patient/family education;Manual techniques;Passive range of motion;Dry needling;Vestibular;Spinal Manipulations;Joint Manipulations    PT Next Visit Plan Audra to Dry Needle on 4/18. continue exercises focused on cervical ROM/habituation.  Cervical and thoracic manual therapy.  Wants to try dry needling.  Once neck ROM improves, recheck for BPPV if dizziness continues.    Consulted and Agree with Plan of Care Patient;Family member/caregiver    Family Member Consulted Daughter           Patient will benefit from skilled therapeutic intervention in order to improve the following deficits and impairments:  Decreased range of motion,Increased muscle spasms,Dizziness,Decreased activity tolerance,Pain,Postural dysfunction,Impaired sensation,Decreased strength,Impaired flexibility,Hypomobility  Visit Diagnosis: Cervicalgia  Dizziness and giddiness  Unsteadiness on feet  Abnormal posture     Problem List Patient  Active Problem List   Diagnosis Date Noted  . Essential hypertension 01/23/2015  . Advanced care planning/counseling discussion 04/08/2014  . Memory loss or impairment 11/20/2013  . Mild cognitive impairment 07/17/2013  . Smoking history 07/17/2013  . Vitamin D deficiency 11/13/2012  . Diabetic neuropathy (HCC) 11/13/2012  . DM type 2 with diabetic peripheral neuropathy (HCC) 11/13/2012  . Benign hypertensive heart disease without heart failure 11/13/2012  . Hyperlipidemia LDL goal <100 11/13/2012    Tempie Donning, PT, DPT 05/01/2020, 12:26 PM  [facility] [address] [place], [place], [address] Phone: [phone]   Fax:  [phone]  Name: Danny Winters MRN: [id number] Date of Birth: [birth year]

## 2020-05-08 ENCOUNTER — Encounter: Payer: Self-pay | Admitting: Nurse Practitioner

## 2020-05-11 ENCOUNTER — Other Ambulatory Visit: Payer: Self-pay

## 2020-05-11 ENCOUNTER — Ambulatory Visit: Payer: Medicare HMO | Admitting: Physical Therapy

## 2020-05-11 DIAGNOSIS — M542 Cervicalgia: Secondary | ICD-10-CM

## 2020-05-11 DIAGNOSIS — R42 Dizziness and giddiness: Secondary | ICD-10-CM

## 2020-05-11 DIAGNOSIS — R2681 Unsteadiness on feet: Secondary | ICD-10-CM

## 2020-05-11 DIAGNOSIS — R293 Abnormal posture: Secondary | ICD-10-CM | POA: Diagnosis not present

## 2020-05-11 NOTE — Therapy (Signed)
[facility] [address] [place], [place], [address] Phone: [phone]   Fax:  [phone]  Physical Therapy Treatment  Patient Details  Name: Danny Winters MRN: [id number] Date of Birth: 02-Feb-1945 Referring Provider (PT): Abbey Chatters, NP   Encounter Date: 05/11/2020   PT End of Session - 05/11/20 0950    Visit Number 7    Number of Visits 13    Date for PT Re-Evaluation 06/05/20   POC for 6 weeks, Cert for 60 days   Authorization Type [employer] (10th Visit PN) - approved for 13 visits from 04/06/20 - 06/05/20    Progress Note Due on Visit 10    PT Start Time 0941   arrived late   PT Stop Time 1016    PT Time Calculation (min) 35 min    Activity Tolerance Patient tolerated treatment well    Behavior During Therapy [facility] for tasks assessed/performed           Past Medical History:  Diagnosis Date  . GERD (gastroesophageal reflux disease)   . H/O degenerative disc disease   . Lumbago   . Memory loss   . Other and unspecified hyperlipidemia   . Type II or unspecified type diabetes mellitus without mention of complication, uncontrolled   . Unspecified arthropathy, shoulder region   . Unspecified essential hypertension   . Unspecified hereditary and idiopathic peripheral neuropathy   . Unspecified vitamin D deficiency     Past Surgical History:  Procedure Laterality Date  . COLONOSCOPY  2005   Dr Worthy Flank Medoff  . COLONOSCOPY W/ POLYPECTOMY  2012   [facility]- sessile polyp in transverse colon 4 mm and another 6 mm  . INGUINAL HERNIA REPAIR  2019  . KIDNEY STONE SURGERY  2013   Dr Ihor Gully    There were no vitals filed for this visit.   Subjective Assessment - 05/11/20 0951    Subjective Dizziness is better, pt made up his mind that he wanted to sleep.  Is still nervous about dry needling and side effects.  Still having some dizziness with lying down.    Patient is accompained by:  Family member   Daughter   Pertinent History GERD, Memory Loss, Lumbago, DM Type 2, HTN, HLD, Peripheral Neuropathy    Limitations Walking    Diagnostic tests Cervical X-Ray: Multilevel mild degenerative disc disease. Lumbar X-ray: Advanced degenerative disc disease at L5-S1. Patient scheduled to have MRI.    Patient Stated Goals resolve dizziness    Currently in Pain? Yes    Pain Score 5     Pain Location Neck    Pain Orientation Right;Left    Pain Descriptors / Indicators Sore                   Vestibular Assessment - 05/11/20 0953      Positional Testing   Dix-Hallpike Dix-Hallpike Right;Dix-Hallpike Left    Sidelying Test Sidelying Right;Sidelying Left    Horizontal Canal Testing Horizontal Canal Right;Horizontal Canal Left      Dix-Hallpike Right   Dix-Hallpike Right Duration 0    Dix-Hallpike Right Symptoms No nystagmus   reported wooziness     Dix-Hallpike Left   Dix-Hallpike Left Duration 0    Dix-Hallpike Left Symptoms No nystagmus   mild wooziness     Sidelying Right   Sidelying Right Duration 0    Sidelying Right Symptoms No nystagmus      Sidelying Left   Sidelying Left  Duration 0    Sidelying Left Symptoms No nystagmus      Horizontal Canal Right   Horizontal Canal Right Duration 0    Horizontal Canal Right Symptoms Normal   mild wooziness     Horizontal Canal Left   Horizontal Canal Left Duration 0    Horizontal Canal Left Symptoms Normal      Positional Sensitivities   Sit to Supine No dizziness    Supine to Left Side No dizziness    Supine to Right Side Lightheadedness    Supine to Sitting Mild dizziness    Right Hallpike Lightheadedness    Up from Right Hallpike Lightheadedness    Up from Left Hallpike No dizziness    Rolling Right Lightheadedness    Rolling Left No dizziness    Positional Sensitivities Comments decreased sensitivity today                     Vestibular Treatment/Exercise - 05/11/20 1004      Vestibular  Treatment/Exercise   Vestibular Treatment Provided Gaze    Gaze Exercises X1 Viewing Horizontal;X1 Viewing Vertical      X1 Viewing Horizontal   Foot Position seated without back support    Reps 2    Comments 30 seconds, mild symptoms      X1 Viewing Vertical   Foot Position seated without back support    Reps 2    Comments 30 seconds, mild symptoms              Balance Exercises - 05/11/20 1625      Balance Exercises: Standing   Tandem Gait Forward;Intermittent upper extremity support;4 reps   UE support on counter              PT Short Term Goals - 05/01/20 1219      PT SHORT TERM GOAL #1   Title Patient will be independent with initial HEP for Vestibular and ROM/Strengthening (ALL STGs Due: 04/27/20)    Baseline reports independence    Time 3    Period Weeks    Status Achieved    Target Date 04/27/20      PT SHORT TERM GOAL #2   Title Further vestibular (DVA/MSQ/Convergence) and cervical involvement to be assesed and LTG to be set as appropriate.    Time 3    Period Weeks    Status Achieved             PT Long Term Goals - 05/11/20 1624      PT LONG TERM GOAL #1   Title Patient will be independent with Final HEP for Vestibular/ROM/Strengthening (ALL LTGs Due: 06/05/2020)    Baseline no HEP established    Time 6    Period Weeks    Status New    Target Date 06/05/20      PT LONG TERM GOAL #2   Title Pt will demonstrate 10 degree improvement in pain free cervical spine extension, bilat lateral flexion and rotation to R.    Baseline see flowsheets    Time 6    Period Weeks    Status Revised    Target Date 06/05/20      PT LONG TERM GOAL #3   Title Pt will report 0/5 for all movements on MSQ.    Time 6    Period Weeks    Status New    Target Date 06/05/20      PT LONG TERM GOAL #4   Title Patient will  report 50% improvement in dizziness and radiculopathy symptoms with bed mobility and functional activities    Time 6    Period Weeks     Status New    Target Date 06/05/20                 Plan - 05/11/20 1620    Clinical Impression Statement Pt reports improvement in dizziness since last session but due to ongoing symptoms and improvements in neck ROM, performed re-assessment of positional vertigo - pt negative for BPPV on L and R side.  Pt continues to be hesitant about use of dry needling to address neck pain and ROM so did not proceed with dry needling today.  Continued to focus on gaze adaptation training by introducing VOR x1 viewing in sitting and continued to focus on dynamic standing balance and postural control.  Pt tolerated well with only very mild symptoms of dizziness.  Will continue to progress towards LTG.    Personal Factors and Comorbidities Age;Comorbidity 3+;Time since onset of injury/illness/exacerbation    Comorbidities Advanced degenerative disc disease at L5-S1.    Examination-Activity Limitations Locomotion Level;Reach Overhead    Examination-Participation Restrictions Driving;Cleaning;Interpersonal Relationship    Stability/Clinical Decision Making Evolving/Moderate complexity    Rehab Potential Good    PT Frequency 2x / week    PT Duration 6 weeks    PT Treatment/Interventions ADLs/Self Care Home Management;Canalith Repostioning;Electrical Stimulation;Cryotherapy;Moist Heat;Traction;Gait training;Stair training;Functional mobility training;Therapeutic activities;Therapeutic exercise;Balance training;Neuromuscular re-education;Patient/family education;Manual techniques;Passive range of motion;Dry needling;Vestibular;Spinal Manipulations;Joint Manipulations    PT Next Visit Plan Continue to treat in a private room and continue contact precautions.  He is still hesitant about dry needling; I rechecked BPPV and he was negative.  Continue to progress VOR, continue exercises focused on cervical ROM/habituation and standing balance.  Cervical and thoracic manual therapy.    Consulted and Agree with Plan of  Care Patient;Family member/caregiver    Family Member Consulted Daughter           Patient will benefit from skilled therapeutic intervention in order to improve the following deficits and impairments:  Decreased range of motion,Increased muscle spasms,Dizziness,Decreased activity tolerance,Pain,Postural dysfunction,Impaired sensation,Decreased strength,Impaired flexibility,Hypomobility  Visit Diagnosis: Dizziness and giddiness  Cervicalgia  Unsteadiness on feet     Problem List Patient Active Problem List   Diagnosis Date Noted  . Essential hypertension 01/23/2015  . Advanced care planning/counseling discussion 04/08/2014  . Memory loss or impairment 11/20/2013  . Mild cognitive impairment 07/17/2013  . Smoking history 07/17/2013  . Vitamin D deficiency 11/13/2012  . Diabetic neuropathy (HCC) 11/13/2012  . DM type 2 with diabetic peripheral neuropathy (HCC) 11/13/2012  . Benign hypertensive heart disease without heart failure 11/13/2012  . Hyperlipidemia LDL goal <100 11/13/2012    Dierdre Highman, PT, DPT 05/11/20    4:27 PM    [facility] [address] [place], [place], [address] Phone: [phone]   Fax:  [phone]  Name: Danny Winters MRN: [id number] Date of Birth: [birth year]

## 2020-05-11 NOTE — Patient Instructions (Addendum)
Access Code: [email or web address] URL: [email or web address] Date: 04/20/2020 Prepared by: Jethro Bastos  Exercises Romberg Stance Eyes Closed on Foam Pad - 1 x daily - 5 x weekly - 1 sets - 3 reps - 30 hold Seated Assisted Cervical Rotation with Towel - 1 x daily - 5 x weekly - 1 sets - 10 reps - 15 hold Seated Upper Trapezius Stretch - 1 x daily - 5 x weekly - 1 sets - 3 reps - 30 hold   Gaze Stabilization: Sitting    Keeping eyes on target on wall 3-4 feet away, keep your eyes focused on the center of the letter and move head side to side (like you are saying "NO") for __30__ seconds. Repeat while moving head up and down (like you are saying "YES") for __30__ seconds. Do _2___ sessions per day.   Tandem Walking    Stand with one hand on the counter.  Walk slowly with each foot directly in front of other, heel of one foot touching toes of other foot with each step. Both feet straight ahead.  Perform 4 laps down and back.   Copyright  VHI. All rights reserved.

## 2020-05-12 MED ORDER — MECLIZINE HCL 12.5 MG PO TABS: 12.5000 mg | ORAL_TABLET | Freq: Three times a day (TID) | ORAL | 1 refills | Status: DC | PRN

## 2020-05-15 ENCOUNTER — Ambulatory Visit: Payer: Medicare HMO

## 2020-05-18 ENCOUNTER — Ambulatory Visit: Payer: Medicare HMO

## 2020-05-19 ENCOUNTER — Encounter: Payer: Self-pay | Admitting: Nurse Practitioner

## 2020-05-20 MED ORDER — ACCU-CHEK AVIVA PLUS VI STRP: ORAL_STRIP | 1 refills | Status: DC

## 2020-05-20 MED ORDER — ACCU-CHEK SOFTCLIX LANCETS MISC: 1 refills | Status: DC

## 2020-05-21 ENCOUNTER — Encounter: Payer: Self-pay | Admitting: Nurse Practitioner

## 2020-05-21 DIAGNOSIS — R42 Dizziness and giddiness: Secondary | ICD-10-CM

## 2020-05-22 ENCOUNTER — Ambulatory Visit: Payer: Medicare HMO | Admitting: Physical Therapy

## 2020-05-22 ENCOUNTER — Other Ambulatory Visit: Payer: Self-pay | Admitting: Nurse Practitioner

## 2020-05-22 DIAGNOSIS — F419 Anxiety disorder, unspecified: Secondary | ICD-10-CM

## 2020-05-26 ENCOUNTER — Other Ambulatory Visit: Payer: Self-pay | Admitting: Nurse Practitioner

## 2020-05-27 ENCOUNTER — Other Ambulatory Visit: Payer: Self-pay | Admitting: *Deleted

## 2020-05-27 MED ORDER — ACCU-CHEK SOFTCLIX LANCETS MISC: 1 refills | Status: DC

## 2020-05-27 NOTE — Telephone Encounter (Signed)
Pharmacy requested refill

## 2020-06-01 ENCOUNTER — Other Ambulatory Visit: Payer: Self-pay | Admitting: Nurse Practitioner

## 2020-06-03 ENCOUNTER — Encounter: Payer: Self-pay | Admitting: Nurse Practitioner

## 2020-06-03 MED ORDER — MECLIZINE HCL 12.5 MG PO TABS: 12.5000 mg | ORAL_TABLET | Freq: Three times a day (TID) | ORAL | 1 refills | Status: DC | PRN

## 2020-06-10 ENCOUNTER — Encounter: Payer: Self-pay | Admitting: Nurse Practitioner

## 2020-06-10 MED ORDER — ACCU-CHEK AVIVA PLUS VI STRP: ORAL_STRIP | 0 refills | Status: DC

## 2020-06-10 MED ORDER — ACCU-CHEK SOFTCLIX LANCETS MISC: 0 refills | Status: DC

## 2020-06-15 ENCOUNTER — Encounter: Payer: Self-pay | Admitting: Nurse Practitioner

## 2020-06-15 DIAGNOSIS — H6123 Impacted cerumen, bilateral: Secondary | ICD-10-CM | POA: Diagnosis not present

## 2020-06-15 DIAGNOSIS — H9193 Unspecified hearing loss, bilateral: Secondary | ICD-10-CM | POA: Insufficient documentation

## 2020-06-15 DIAGNOSIS — R42 Dizziness and giddiness: Secondary | ICD-10-CM | POA: Insufficient documentation

## 2020-06-23 DIAGNOSIS — E1136 Type 2 diabetes mellitus with diabetic cataract: Secondary | ICD-10-CM | POA: Diagnosis not present

## 2020-06-23 DIAGNOSIS — H5203 Hypermetropia, bilateral: Secondary | ICD-10-CM | POA: Diagnosis not present

## 2020-06-23 DIAGNOSIS — H25013 Cortical age-related cataract, bilateral: Secondary | ICD-10-CM | POA: Diagnosis not present

## 2020-06-23 DIAGNOSIS — H2513 Age-related nuclear cataract, bilateral: Secondary | ICD-10-CM | POA: Diagnosis not present

## 2020-06-23 DIAGNOSIS — H524 Presbyopia: Secondary | ICD-10-CM | POA: Diagnosis not present

## 2020-06-23 LAB — HM DIABETES EYE EXAM

## 2020-06-24 ENCOUNTER — Encounter: Payer: Self-pay | Admitting: Neurology

## 2020-06-24 ENCOUNTER — Encounter: Payer: Self-pay | Admitting: *Deleted

## 2020-06-24 ENCOUNTER — Other Ambulatory Visit: Payer: Self-pay

## 2020-06-24 ENCOUNTER — Ambulatory Visit: Payer: Medicare HMO | Admitting: Neurology

## 2020-06-24 VITALS — BP 115/71 | HR 78 | Ht 70.0 in | Wt 151.0 lb

## 2020-06-24 DIAGNOSIS — R351 Nocturia: Secondary | ICD-10-CM

## 2020-06-24 DIAGNOSIS — R519 Headache, unspecified: Secondary | ICD-10-CM

## 2020-06-24 DIAGNOSIS — R0681 Apnea, not elsewhere classified: Secondary | ICD-10-CM | POA: Diagnosis not present

## 2020-06-24 DIAGNOSIS — G479 Sleep disorder, unspecified: Secondary | ICD-10-CM

## 2020-06-24 DIAGNOSIS — H81399 Other peripheral vertigo, unspecified ear: Secondary | ICD-10-CM

## 2020-06-24 DIAGNOSIS — R42 Dizziness and giddiness: Secondary | ICD-10-CM
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
## 2020-06-24 NOTE — Patient Instructions (Addendum)
It was good to see you both again today.  Here is what we discussed today:   Please continue with your vitamin D supplementation and talk to your primary care about rechecking your blood work for vitamin B12 and vitamin D, you may benefit from B12 injections.  Please keep your appointment to get your hearing tested as this was recommended by the ENT PA.  I do believe you may benefit from physical therapy for your vertigo.  Please check on the status of your referral to physical therapy.   Based on your symptoms and your exam I believe you are at risk for obstructive sleep apnea (aka OSA), and I think we should proceed with a sleep study to determine whether you do or do not have OSA and how severe it is. Even, if you have mild OSA, I may want you to consider treatment with CPAP, as treatment of even borderline or mild sleep apnea can result and improvement of symptoms such as sleep disruption, daytime sleepiness, nighttime bathroom breaks, restless leg symptoms, improvement of headache syndromes, even improved mood disorder.   As explained, an attended sleep study meaning you get to stay overnight in the sleep lab, lets [place] monitor sleep-related behaviors such as sleep talking and leg movements in sleep, in addition to monitoring for sleep apnea.  A home sleep test is a screening tool for sleep apnea only, and unfortunately does not help with any other sleep-related diagnoses.  Please remember, the long-term risks and ramifications of untreated moderate to severe obstructive sleep apnea are: increased Cardiovascular disease, including congestive heart failure, stroke, difficult to control hypertension, treatment resistant obesity, arrhythmias, especially irregular heartbeat commonly known as A. Fib. (atrial fibrillation); even type 2 diabetes has been linked to untreated OSA.   Sleep apnea can cause disruption of sleep and sleep deprivation in most cases, which, in turn, can cause recurrent headaches,  problems with memory, mood, concentration, focus, and vigilance. Most people with untreated sleep apnea report excessive daytime sleepiness, which can affect their ability to drive. Please do not drive if you feel sleepy. Patients with sleep apnea can also develop difficulty initiating and maintaining sleep (aka insomnia).   Having sleep apnea may increase your risk for other sleep disorders, including involuntary behaviors sleep such as sleep terrors, sleep talking, sleepwalking.    Having sleep apnea can also increase your risk for restless leg syndrome and leg movements at night.   Please note that untreated obstructive sleep apnea may carry additional perioperative morbidity. Patients with significant obstructive sleep apnea (typically, in the moderate to severe degree) should receive, if possible, perioperative PAP (positive airway pressure) therapy and the surgeons and particularly the anesthesiologists should be informed of the diagnosis and the severity of the sleep disordered breathing.   We will call you with the results on your sleep test.

## 2020-06-30 ENCOUNTER — Other Ambulatory Visit: Payer: Self-pay | Admitting: *Deleted

## 2020-06-30 DIAGNOSIS — E1142 Type 2 diabetes mellitus with diabetic polyneuropathy: Secondary | ICD-10-CM

## 2020-06-30 MED ORDER — METFORMIN HCL 1000 MG PO TABS: ORAL_TABLET | ORAL | 1 refills | Status: DC

## 2020-06-30 NOTE — Telephone Encounter (Signed)
[facility] Requested refill.  

## 2020-07-02 DIAGNOSIS — M546 Pain in thoracic spine: Secondary | ICD-10-CM | POA: Diagnosis not present

## 2020-07-02 DIAGNOSIS — H8113 Benign paroxysmal vertigo, bilateral: Secondary | ICD-10-CM | POA: Diagnosis not present

## 2020-07-02 DIAGNOSIS — M542 Cervicalgia: Secondary | ICD-10-CM | POA: Diagnosis not present

## 2020-07-02 DIAGNOSIS — R42 Dizziness and giddiness: Secondary | ICD-10-CM | POA: Diagnosis not present

## 2020-07-03 ENCOUNTER — Telehealth: Payer: Self-pay

## 2020-07-03 NOTE — Telephone Encounter (Signed)
Tried to call pt to schedule sleep study. U/a to lvm on preferred number to call [phone])

## 2020-07-06 ENCOUNTER — Telehealth: Payer: Self-pay

## 2020-07-06 NOTE — Telephone Encounter (Signed)
I called pt's primary number. Not able to lvm due to voicemail not being set up. I called cell number it was his daughter that lives in [place]. She will have the daughter that lives in town to call back to schedule sleep study.

## 2020-07-07 DIAGNOSIS — H8113 Benign paroxysmal vertigo, bilateral: Secondary | ICD-10-CM | POA: Diagnosis not present

## 2020-07-07 DIAGNOSIS — M542 Cervicalgia: Secondary | ICD-10-CM | POA: Diagnosis not present

## 2020-07-07 DIAGNOSIS — M546 Pain in thoracic spine: Secondary | ICD-10-CM | POA: Diagnosis not present

## 2020-07-07 DIAGNOSIS — R42 Dizziness and giddiness: Secondary | ICD-10-CM | POA: Diagnosis not present

## 2020-07-08 ENCOUNTER — Other Ambulatory Visit: Payer: Self-pay | Admitting: Nurse Practitioner

## 2020-07-08 DIAGNOSIS — F419 Anxiety disorder, unspecified: Secondary | ICD-10-CM

## 2020-07-08 NOTE — Telephone Encounter (Signed)
Patient's daughter called to schedule sleep study. Let her know someone would be giving her a call back to schedule that. I have updated the best number to reach her at as primary number in system.

## 2020-07-13 ENCOUNTER — Telehealth: Payer: Self-pay

## 2020-07-13 NOTE — Telephone Encounter (Signed)
I returned pt's daughter's call. No answer unable to lvm due to voicemail not being set up.

## 2020-07-20 ENCOUNTER — Telehealth: Payer: Self-pay

## 2020-07-20 NOTE — Telephone Encounter (Signed)
Called to schedule home sleep test for pt. No answer, voicemail is full.

## 2020-07-20 NOTE — Telephone Encounter (Signed)
We have attempted to call the patient multiple times to schedule sleep study. Patient has been unavailable at the phone numbers we have on file and has not returned our calls. If patient calls back we will schedule them for their sleep study.

## 2020-07-23 DIAGNOSIS — M542 Cervicalgia: Secondary | ICD-10-CM | POA: Diagnosis not present

## 2020-07-23 DIAGNOSIS — M546 Pain in thoracic spine: Secondary | ICD-10-CM | POA: Diagnosis not present

## 2020-07-23 DIAGNOSIS — H8113 Benign paroxysmal vertigo, bilateral: Secondary | ICD-10-CM | POA: Diagnosis not present

## 2020-07-23 DIAGNOSIS — R42 Dizziness and giddiness: Secondary | ICD-10-CM | POA: Diagnosis not present

## 2020-07-24 ENCOUNTER — Other Ambulatory Visit: Payer: Medicare HMO

## 2020-07-24 ENCOUNTER — Other Ambulatory Visit: Payer: Self-pay

## 2020-07-24 DIAGNOSIS — E785 Hyperlipidemia, unspecified: Secondary | ICD-10-CM | POA: Diagnosis not present

## 2020-07-24 DIAGNOSIS — E559 Vitamin D deficiency, unspecified: Secondary | ICD-10-CM | POA: Diagnosis not present

## 2020-07-24 DIAGNOSIS — E1142 Type 2 diabetes mellitus with diabetic polyneuropathy: Secondary | ICD-10-CM | POA: Diagnosis not present

## 2020-07-24 DIAGNOSIS — E538 Deficiency of other specified B group vitamins: Secondary | ICD-10-CM | POA: Diagnosis not present

## 2020-07-25 ENCOUNTER — Other Ambulatory Visit: Payer: Self-pay | Admitting: Nurse Practitioner

## 2020-07-25 DIAGNOSIS — E1142 Type 2 diabetes mellitus with diabetic polyneuropathy: Secondary | ICD-10-CM

## 2020-07-25 LAB — LIPID PANEL
Cholesterol: 110 mg/dL (ref <200)
HDL: 36 mg/dL — ABNORMAL LOW (ref >=40)
LDL Cholesterol (Calc): 56 mg/dL (calc)
Non-HDL Cholesterol (Calc): 74 mg/dL (calc) (ref <130)
Total CHOL/HDL Ratio: 3.1 (calc) (ref <5.0)
Triglycerides: 92 mg/dL (ref <150)

## 2020-07-25 LAB — CBC WITH DIFFERENTIAL/PLATELET
Absolute Monocytes: 342 cells/uL (ref 200–950)
Basophils Absolute: 19 cells/uL (ref 0–200)
Basophils Relative: 0.5 %
Eosinophils Absolute: 80 cells/uL (ref 15–500)
Eosinophils Relative: 2.1 %
HCT: 40.5 % (ref 38.5–50.0)
Hemoglobin: 13.4 g/dL (ref 13.2–17.1)
Lymphs Abs: 1303 cells/uL (ref 850–3900)
MCH: 29.8 pg (ref 27.0–33.0)
MCHC: 33.1 g/dL (ref 32.0–36.0)
MCV: 90 fL (ref 80.0–100.0)
MPV: 9.9 fL (ref 7.5–12.5)
Monocytes Relative: 9 %
Neutro Abs: 2056 cells/uL (ref 1500–7800)
Neutrophils Relative %: 54.1 %
Platelets: 200 10*3/uL (ref 140–400)
RBC: 4.5 10*6/uL (ref 4.20–5.80)
RDW: 13 % (ref 11.0–15.0)
Total Lymphocyte: 34.3 %
WBC: 3.8 10*3/uL (ref 3.8–10.8)

## 2020-07-25 LAB — COMPLETE METABOLIC PANEL WITH GFR
AG Ratio: 1.6 (calc) (ref 1.0–2.5)
ALT: 8 U/L — ABNORMAL LOW (ref 9–46)
AST: 15 U/L (ref 10–35)
Albumin: 3.9 g/dL (ref 3.6–5.1)
Alkaline phosphatase (APISO): 51 U/L (ref 35–144)
BUN: 14 mg/dL (ref 7–25)
CO2: 28 mmol/L (ref 20–32)
Calcium: 9.6 mg/dL (ref 8.6–10.3)
Chloride: 104 mmol/L (ref 98–110)
Creat: 1.17 mg/dL (ref 0.70–1.18)
GFR, Est African American: 70 mL/min/{1.73_m2} (ref >=60)
GFR, Est Non African American: 61 mL/min/{1.73_m2} (ref >=60)
Globulin: 2.5 g/dL (calc) (ref 1.9–3.7)
Glucose, Bld: 84 mg/dL (ref 65–99)
Potassium: 4.1 mmol/L (ref 3.5–5.3)
Sodium: 140 mmol/L (ref 135–146)
Total Bilirubin: 0.5 mg/dL (ref 0.2–1.2)
Total Protein: 6.4 g/dL (ref 6.1–8.1)

## 2020-07-25 LAB — VITAMIN D 25 HYDROXY (VIT D DEFICIENCY, FRACTURES): Vit D, 25-Hydroxy: 98 ng/mL (ref 30–100)

## 2020-07-25 LAB — HEMOGLOBIN A1C
Hgb A1c MFr Bld: 6.3 % of total Hgb — ABNORMAL HIGH (ref <5.7)
Mean Plasma Glucose: 134 mg/dL
eAG (mmol/L): 7.4 mmol/L

## 2020-07-25 LAB — VITAMIN B12: Vitamin B-12: 329 pg/mL (ref 200–1100)

## 2020-07-29 ENCOUNTER — Encounter: Payer: Self-pay | Admitting: Nurse Practitioner

## 2020-07-29 ENCOUNTER — Ambulatory Visit (INDEPENDENT_AMBULATORY_CARE_PROVIDER_SITE_OTHER): Payer: Medicare HMO | Admitting: Nurse Practitioner

## 2020-07-29 ENCOUNTER — Other Ambulatory Visit: Payer: Self-pay

## 2020-07-29 VITALS — BP 122/70 | HR 90 | Temp 97.1°F | Ht 70.0 in | Wt 155.2 lb

## 2020-07-29 DIAGNOSIS — H8113 Benign paroxysmal vertigo, bilateral: Secondary | ICD-10-CM | POA: Diagnosis not present

## 2020-07-29 DIAGNOSIS — R42 Dizziness and giddiness: Secondary | ICD-10-CM | POA: Diagnosis not present

## 2020-07-29 DIAGNOSIS — E559 Vitamin D deficiency, unspecified: Secondary | ICD-10-CM

## 2020-07-29 DIAGNOSIS — M542 Cervicalgia: Secondary | ICD-10-CM | POA: Diagnosis not present

## 2020-07-29 DIAGNOSIS — K5904 Chronic idiopathic constipation: Secondary | ICD-10-CM | POA: Diagnosis not present

## 2020-07-29 DIAGNOSIS — E538 Deficiency of other specified B group vitamins: Secondary | ICD-10-CM

## 2020-07-29 DIAGNOSIS — M25511 Pain in right shoulder: Secondary | ICD-10-CM | POA: Diagnosis not present

## 2020-07-29 DIAGNOSIS — M546 Pain in thoracic spine: Secondary | ICD-10-CM | POA: Diagnosis not present

## 2020-07-29 DIAGNOSIS — F419 Anxiety disorder, unspecified: Secondary | ICD-10-CM

## 2020-07-29 MED ORDER — VITAMIN D3 25 MCG (1000 UT) PO CAPS: 1000.0000 [IU] | ORAL_CAPSULE | Freq: Every day | ORAL | Status: DC

## 2020-07-29 MED ORDER — POLYETHYLENE GLYCOL 3350 17 GM/SCOOP PO POWD: 17.0000 g | Freq: Every day | ORAL | 1 refills | Status: DC

## 2020-07-29 NOTE — Progress Notes (Signed)
Careteam: Patient Care Team: Sharon Seller, NP as PCP - General (Geriatric Medicine) Jethro Bolus, MD as Consulting Physician (Ophthalmology)  PLACE OF SERVICE:  [facility]  Advanced Directive information Does Patient Have a Medical Advance Directive?: No, Would patient like information on creating a medical advance directive?: No - Patient declined  Allergies  Allergen Reactions   Lisinopril Itching    Chief Complaint  Patient presents with   Medical Management of Chronic Issues    3 month follow up.Patient would like ears looked at.  Patient states meclizine is not working   Health Maintenance    Zoster vaccine, Tetanus/tdap    HPI: Patient is a 75 y.o. male for routine follow up.   Ongoing vertigo, followed by ENT and neurologist.   Reports overall doing better with anxiety- calming down. Taking zoloft 25 mg daily   DM- reports 2 low blood sugar ~60s. Took some orange juice which helped. Taking metformin and januvia. Usually in the 90s.  A1c is at goal.  Denies neuropathy in legs.   Reports neuropathy to left hand- hurt shoulder 3-4 months ago now having pain shooting down arm. Thought he could "work it out" Restarted gabapentin at bedtime   Review of Systems:  Review of Systems  Constitutional:  Negative for chills, fever and weight loss.  HENT:  Negative for tinnitus.   Respiratory:  Negative for cough, sputum production and shortness of breath.   Cardiovascular:  Negative for chest pain, palpitations and leg swelling.  Gastrointestinal:  Positive for constipation. Negative for abdominal pain, diarrhea and heartburn.  Genitourinary:  Negative for dysuria, frequency and urgency.  Musculoskeletal:  Positive for joint pain and myalgias. Negative for back pain and falls.  Skin: Negative.   Neurological:  Positive for dizziness. Negative for headaches.  Psychiatric/Behavioral:  Negative for depression and memory loss. The patient does not have insomnia.     Past Medical History:  Diagnosis Date   GERD (gastroesophageal reflux disease)    H/O degenerative disc disease    Lumbago    Memory loss    Other and unspecified hyperlipidemia    Type II or unspecified type diabetes mellitus without mention of complication, uncontrolled    Unspecified arthropathy, shoulder region    Unspecified essential hypertension    Unspecified hereditary and idiopathic peripheral neuropathy    Unspecified vitamin D deficiency    Past Surgical History:  Procedure Laterality Date   COLONOSCOPY  2005   Dr Worthy Flank Medoff   COLONOSCOPY W/ POLYPECTOMY  2012   [facility]- sessile polyp in transverse colon 4 mm and another 6 mm   INGUINAL HERNIA REPAIR  2019   KIDNEY STONE SURGERY  2013   Dr Ihor Gully   Social History:   reports that he quit smoking about 47 years ago. His smoking use included cigarettes. He has a 11.00 pack-year smoking history. He has never used smokeless tobacco. He reports previous alcohol use. He reports that he does not use drugs.  Family History  Problem Relation Age of Onset   Heart disease Mother    Heart attack Father    Cancer Sister        colon   Cancer Brother        colon   Cancer Brother        colon   Cancer Brother        prostate   Cancer Brother        prostate   Cancer Brother  Pancreas   Stroke Other     Medications: Patient's Medications  New Prescriptions   No medications on file  Previous Medications   ACCU-CHEK SOFTCLIX LANCETS LANCETS    USE TO TEST BLOOD SUGAR TWICE DAILY. Dx:E11.42   ALCOHOL SWABS (B-D SINGLE USE SWABS REGULAR) PADS    USE DAILY AS DIRECTED WHEN CHECKING BLOOD SUGAR   ASPIRIN 81 MG TABLET    Take 81 mg by mouth daily.   CHOLECALCIFEROL (VITAMIN D3) 50 MCG (2000 UT) TABS    Take 1 tablet by mouth daily. To start after a 12-week course of 50,000 units weekly completed.   DICLOFENAC SODIUM (VOLTAREN) 1 % GEL    Apply 2 g topically 4 (four) times daily.   FLUTICASONE  (FLONASE) 50 MCG/ACT NASAL SPRAY    Place 2 sprays into both nostrils daily.   GABAPENTIN (NEURONTIN) 100 MG CAPSULE    Take 1 capsule (100 mg total) by mouth at bedtime.   GLUCOSE BLOOD (ACCU-CHEK AVIVA PLUS) TEST STRIP    USE TO CHECK BLOOD SUGAR TWICE DAILY.Dx:E11.42   IRBESARTAN (AVAPRO) 75 MG TABLET    TAKE 1 TABLET BY MOUTH EVERY DAY   JANUVIA 100 MG TABLET    TAKE 1 TABLET EVERY DAY   MECLIZINE (ANTIVERT) 12.5 MG TABLET    Take 1-2 tablets (12.5-25 mg total) by mouth 3 (three) times daily as needed for dizziness.   METFORMIN (GLUCOPHAGE) 1000 MG TABLET    Take 1 by mouth two times daily for blood sugar E11.42   OMEPRAZOLE (PRILOSEC) 20 MG CAPSULE    TAKE 1 CAPSULE EVERY DAY   SERTRALINE (ZOLOFT) 25 MG TABLET    TAKE 1 TABLET EVERY DAY   SIMVASTATIN (ZOCOR) 10 MG TABLET    TAKE 1 TABLET (10 MG TOTAL) BY MOUTH DAILY AT 6 PM.   TIZANIDINE (ZANAFLEX) 2 MG TABLET    Take 1-2 tablets (2-4 mg total) by mouth every 6 (six) hours as needed for muscle spasms.   VITAMIN D, ERGOCALCIFEROL, (DRISDOL) 1.25 MG (50000 UNIT) CAPS CAPSULE    Take 1 capsule (50,000 Units total) by mouth every 7 (seven) days.  Modified Medications   No medications on file  Discontinued Medications   No medications on file    Physical Exam:  Vitals:   07/29/20 0953  BP: 122/70  Pulse: 90  Temp: (!) 97.1 F (36.2 C)  TempSrc: Temporal  SpO2: 96%  Weight: 155 lb 3.2 oz (70.4 kg)  Height: 5\' 10"  (1.778 m)   Body mass index is 22.27 kg/m. Wt Readings from Last 3 Encounters:  07/29/20 155 lb 3.2 oz (70.4 kg)  06/24/20 151 lb (68.5 kg)  04/29/20 157 lb (71.2 kg)    Physical Exam Constitutional:      General: He is not in acute distress.    Appearance: He is well-developed. He is not diaphoretic.  HENT:     Head: Normocephalic and atraumatic.     Right Ear: External ear normal.     Left Ear: External ear normal.     Mouth/Throat:     Pharynx: No oropharyngeal exudate.  Eyes:     Conjunctiva/sclera:  Conjunctivae normal.     Pupils: Pupils are equal, round, and reactive to light.  Cardiovascular:     Rate and Rhythm: Normal rate and regular rhythm.     Heart sounds: Normal heart sounds.  Pulmonary:     Effort: Pulmonary effort is normal.     Breath sounds: Normal breath sounds.  Abdominal:     General: Bowel sounds are normal.     Palpations: Abdomen is soft.  Musculoskeletal:     Right shoulder: Tenderness (to anterior shoulder) and crepitus present. Normal range of motion.     Cervical back: Normal range of motion and neck supple.     Right knee: Crepitus present.     Left knee: Crepitus present.     Right lower leg: No edema.     Left lower leg: No edema.  Skin:    General: Skin is warm and dry.  Neurological:     Mental Status: He is alert and oriented to person, place, and time.    Labs reviewed: Basic Metabolic Panel: Recent Labs    02/24/20 1458 03/23/20 1114 07/24/20 0806  NA 136 138 140  K 4.4 4.2 4.1  CL 100 100 104  CO2 29 24 28   GLUCOSE 84 100* 84  BUN 13 18 14   CREATININE 1.12 1.10 1.17  CALCIUM 9.7 9.7 9.6  TSH  --  1.370  --    Liver Function Tests: Recent Labs    10/30/19 1036 03/23/20 1114 07/24/20 0806  AST 18 20 15   ALT 9 7 8*  ALKPHOS  --  58  --   BILITOT 1.0 0.9 0.5  PROT 7.1 7.1 6.4  ALBUMIN  --  4.4  --    No results for input(s): LIPASE, AMYLASE in the last 8760 hours. No results for input(s): AMMONIA in the last 8760 hours. CBC: Recent Labs    02/24/20 1458 03/18/20 1206 07/24/20 0806  WBC 3.5* 5.3 3.8  NEUTROABS 1,642 3,265 2,056  HGB 15.0 16.1 13.4  HCT 44.4 46.6 40.5  MCV 87.1 86.6 90.0  PLT 173 196 200   Lipid Panel: Recent Labs    07/24/20 0806  CHOL 110  HDL 36*  LDLCALC 56  TRIG 92  CHOLHDL 3.1   TSH: Recent Labs    03/23/20 1114  TSH 1.370   A1C: Lab Results  Component Value Date   HGBA1C 6.3 (H) 07/24/2020     Assessment/Plan 1. Acute pain of right shoulder -reports he is very active  and using shoulder frequently to carry things. Reports possible overuse. Increase in tenderness and neuropathy reported over the last few weeks.  PT already working with him.  - AMB referral to orthopedics  2. Chronic idiopathic constipation -increase hydration with water and fiber -can add miralax daily if needed  - polyethylene glycol powder (GLYCOLAX/MIRALAX) 17 GM/SCOOP powder; Take 17 g by mouth daily.  Dispense: 3350 g; Refill: 1  3. Anxiety Improved since on zoloft.   4. Dizziness Ongoing. Continues on PT.   5. Vitamin D deficiency Improved, to stop vit d 50000 units weekly and start vit d 1000 units daily  6. Vit b12 def -still borderline low, continue vit b12 1000 mcg daily    Next appt: 4 months, sooner if needed Tywanna Seifer K. 09/24/20  [facility] [phone]

## 2020-07-29 NOTE — Patient Instructions (Addendum)
Stop VIt D 50,000 units weekly Start vit d 1000 units daily  Make sure you are taking B12 1000 mcg daily   Continue to eat 3 good meals a day- proper protein, complex carbohydrates

## 2020-08-01 ENCOUNTER — Other Ambulatory Visit: Payer: Self-pay | Admitting: Nurse Practitioner

## 2020-08-01 DIAGNOSIS — E559 Vitamin D deficiency, unspecified: Secondary | ICD-10-CM

## 2020-08-07 ENCOUNTER — Ambulatory Visit: Payer: Medicare HMO | Admitting: Orthopaedic Surgery

## 2020-08-23 ENCOUNTER — Other Ambulatory Visit: Payer: Self-pay | Admitting: Nurse Practitioner

## 2020-08-23 DIAGNOSIS — E1142 Type 2 diabetes mellitus with diabetic polyneuropathy: Secondary | ICD-10-CM

## 2020-09-04 ENCOUNTER — Other Ambulatory Visit: Payer: Self-pay | Admitting: Nurse Practitioner

## 2020-10-08 ENCOUNTER — Other Ambulatory Visit: Payer: Self-pay | Admitting: Nurse Practitioner

## 2020-10-23 ENCOUNTER — Other Ambulatory Visit: Payer: Self-pay | Admitting: Nurse Practitioner

## 2020-10-23 DIAGNOSIS — E785 Hyperlipidemia, unspecified: Secondary | ICD-10-CM

## 2020-11-03 ENCOUNTER — Other Ambulatory Visit: Payer: Self-pay | Admitting: *Deleted

## 2020-11-03 MED ORDER — SITAGLIPTIN PHOSPHATE 100 MG PO TABS: 100.0000 mg | ORAL_TABLET | Freq: Every day | ORAL | 1 refills | Status: DC

## 2020-11-03 NOTE — Telephone Encounter (Signed)
Patient requested refill to be sent to [facility], stated that local pharmacy is too much in price.

## 2020-11-17 ENCOUNTER — Telehealth: Payer: Self-pay | Admitting: *Deleted

## 2020-11-17 DIAGNOSIS — E1142 Type 2 diabetes mellitus with diabetic polyneuropathy: Secondary | ICD-10-CM

## 2020-11-17 DIAGNOSIS — E538 Deficiency of other specified B group vitamins: Secondary | ICD-10-CM

## 2020-11-17 DIAGNOSIS — I10 Essential (primary) hypertension: Secondary | ICD-10-CM

## 2020-11-17 NOTE — Telephone Encounter (Signed)
Patient called and stated that he cannot afford the Januvia and wonders if you can prescribe something more cost effective.   Please Advise.

## 2020-11-18 NOTE — Telephone Encounter (Signed)
Patient stated that he hasn't been taking it at all because he could not afford it.

## 2020-11-18 NOTE — Telephone Encounter (Signed)
Does he have enough to get him to his next appt? He has appt in November and we can discuss.  Lets have him get lab prior prior to next OV if he is able and to bring fasting blood sugar readings with him at that time.

## 2020-11-18 NOTE — Telephone Encounter (Signed)
Patient scheduled an appointment to come in 11/9 Orders placed.

## 2020-11-18 NOTE — Telephone Encounter (Signed)
If he is able would like for him to come in and get a1c, vit b12 and cmp prior to next OV due to b12 def and diabetes.

## 2020-11-18 NOTE — Telephone Encounter (Signed)
LMOM for patient to return call.

## 2020-11-29 ENCOUNTER — Other Ambulatory Visit: Payer: Self-pay | Admitting: Nurse Practitioner

## 2020-11-29 DIAGNOSIS — E559 Vitamin D deficiency, unspecified: Secondary | ICD-10-CM

## 2020-12-02 ENCOUNTER — Ambulatory Visit: Payer: Medicare HMO | Admitting: Nurse Practitioner

## 2020-12-02 ENCOUNTER — Other Ambulatory Visit: Payer: Self-pay

## 2020-12-02 ENCOUNTER — Other Ambulatory Visit: Payer: Medicare HMO

## 2020-12-02 DIAGNOSIS — I10 Essential (primary) hypertension: Secondary | ICD-10-CM

## 2020-12-02 DIAGNOSIS — E1142 Type 2 diabetes mellitus with diabetic polyneuropathy: Secondary | ICD-10-CM

## 2020-12-02 DIAGNOSIS — E538 Deficiency of other specified B group vitamins: Secondary | ICD-10-CM | POA: Diagnosis not present

## 2020-12-03 LAB — COMPLETE METABOLIC PANEL WITH GFR
AG Ratio: 1.5 (calc) (ref 1.0–2.5)
ALT: 11 U/L (ref 9–46)
AST: 18 U/L (ref 10–35)
Albumin: 4.4 g/dL (ref 3.6–5.1)
Alkaline phosphatase (APISO): 57 U/L (ref 35–144)
BUN: 21 mg/dL (ref 7–25)
CO2: 25 mmol/L (ref 20–32)
Calcium: 10 mg/dL (ref 8.6–10.3)
Chloride: 102 mmol/L (ref 98–110)
Creat: 1.26 mg/dL (ref 0.70–1.28)
Globulin: 2.9 g/dL (calc) (ref 1.9–3.7)
Glucose, Bld: 136 mg/dL (ref 65–139)
Potassium: 4.5 mmol/L (ref 3.5–5.3)
Sodium: 139 mmol/L (ref 135–146)
Total Bilirubin: 0.5 mg/dL (ref 0.2–1.2)
Total Protein: 7.3 g/dL (ref 6.1–8.1)
eGFR: 59 mL/min/{1.73_m2} — ABNORMAL LOW (ref >=60)

## 2020-12-03 LAB — HEMOGLOBIN A1C
Hgb A1c MFr Bld: 7 % of total Hgb — ABNORMAL HIGH (ref <5.7)
Mean Plasma Glucose: 154 mg/dL
eAG (mmol/L): 8.5 mmol/L

## 2020-12-03 LAB — VITAMIN B12: Vitamin B-12: 316 pg/mL (ref 200–1100)

## 2020-12-04 ENCOUNTER — Ambulatory Visit (INDEPENDENT_AMBULATORY_CARE_PROVIDER_SITE_OTHER): Payer: Medicare HMO | Admitting: Nurse Practitioner

## 2020-12-04 ENCOUNTER — Other Ambulatory Visit: Payer: Self-pay

## 2020-12-04 ENCOUNTER — Encounter: Payer: Self-pay | Admitting: Nurse Practitioner

## 2020-12-04 VITALS — BP 119/78 | HR 77 | Temp 97.5°F | Ht 70.0 in | Wt 162.0 lb

## 2020-12-04 DIAGNOSIS — R42 Dizziness and giddiness: Secondary | ICD-10-CM

## 2020-12-04 DIAGNOSIS — I10 Essential (primary) hypertension: Secondary | ICD-10-CM | POA: Diagnosis not present

## 2020-12-04 DIAGNOSIS — M25511 Pain in right shoulder: Secondary | ICD-10-CM | POA: Diagnosis not present

## 2020-12-04 DIAGNOSIS — E1142 Type 2 diabetes mellitus with diabetic polyneuropathy: Secondary | ICD-10-CM

## 2020-12-04 DIAGNOSIS — E538 Deficiency of other specified B group vitamins: Secondary | ICD-10-CM

## 2020-12-04 DIAGNOSIS — Z23 Encounter for immunization: Secondary | ICD-10-CM

## 2020-12-04 DIAGNOSIS — H6123 Impacted cerumen, bilateral: Secondary | ICD-10-CM | POA: Diagnosis not present

## 2020-12-04 DIAGNOSIS — F419 Anxiety disorder, unspecified: Secondary | ICD-10-CM | POA: Diagnosis not present

## 2020-12-04 DIAGNOSIS — K219 Gastro-esophageal reflux disease without esophagitis: Secondary | ICD-10-CM | POA: Diagnosis not present

## 2020-12-04 NOTE — Progress Notes (Signed)
Careteam: Patient Care Team: Lauree Chandler, NP as PCP - General (Geriatric Medicine) Rutherford Guys, MD as Consulting Physician (Ophthalmology)  PLACE OF SERVICE:  [place] Directive information Does Patient Have a Medical Advance Directive?: No  Allergies  Allergen Reactions   Lisinopril Itching    Chief Complaint  Patient presents with   Medical Management of Chronic Issues    4 month follow up. Patient would like to discuss alternative to Januvia because of the price.Patient would like to receive flu vaccine today   Health Maintenance    Zoster,tetanus/tdap, flu, foot exam     HPI: Patient is a 75 y.o. male for routine follow up.   DM - A1c 7.0 up from 6.3 in July.  This morning his blood sugar was 94.  He is taking metformin 1000 mg by mouth twice daily   B12- borderline low at 316. He is taking supplement   Hyperlipidemia- LDL at goal in July, on zocor.   Dizziness has resolved.  He is back driving now.   Anxiety- continues on zoloft 25 mg daily   GERD- ongoing taking omeprazole which controls symptoms.   Uses gabapentin at nighttime to help with neuropathy in right hand.    Review of Systems:  Review of Systems  Constitutional:  Negative for chills, fever and weight loss.  HENT:  Negative for tinnitus.   Respiratory:  Negative for cough, sputum production and shortness of breath.   Cardiovascular:  Negative for chest pain, palpitations and leg swelling.  Gastrointestinal:  Negative for abdominal pain, constipation, diarrhea and heartburn.  Genitourinary:  Negative for dysuria, frequency and urgency.  Musculoskeletal:  Negative for back pain, falls, joint pain and myalgias.  Skin: Negative.   Neurological:  Positive for tingling. Negative for dizziness and headaches.  Psychiatric/Behavioral:  Negative for depression and memory loss. The patient does not have insomnia.    Past Medical History:  Diagnosis Date   GERD (gastroesophageal  reflux disease)    H/O degenerative disc disease    Lumbago    Memory loss    Other and unspecified hyperlipidemia    Type II or unspecified type diabetes mellitus without mention of complication, uncontrolled    Unspecified arthropathy, shoulder region    Unspecified essential hypertension    Unspecified hereditary and idiopathic peripheral neuropathy    Unspecified vitamin D deficiency    Past Surgical History:  Procedure Laterality Date   COLONOSCOPY  2005   Dr Sharlotte [place] Medoff   COLONOSCOPY W/ POLYPECTOMY  2012   [facility]- sessile polyp in transverse colon 4 mm and another 6 mm   INGUINAL HERNIA REPAIR  2019   KIDNEY STONE SURGERY  2013   Dr Kathie Rhodes   Social History:   reports that he quit smoking about 47 years ago. His smoking use included cigarettes. He has a 11.00 pack-year smoking history. He has never used smokeless tobacco. He reports that he does not currently use alcohol. He reports that he does not use drugs.  Family History  Problem Relation Age of Onset   Heart disease Mother    Heart attack Father    Cancer Sister        colon   Cancer Brother        colon   Cancer Brother        colon   Cancer Brother        prostate   Cancer Brother        prostate  Cancer Brother        Pancreas   Stroke Other     Medications: Patient's Medications  New Prescriptions   No medications on file  Previous Medications   ACCU-CHEK SOFTCLIX LANCETS LANCETS    USE TO TEST BLOOD SUGAR TWICE DAILY. Dx:E11.42   ALCOHOL SWABS (B-D SINGLE USE SWABS REGULAR) PADS    USE DAILY AS DIRECTED WHEN CHECKING BLOOD SUGAR   ASPIRIN 81 MG TABLET    Take 81 mg by mouth daily.   CHOLECALCIFEROL (VITAMIN D3) 25 MCG (1000 UT) CAPS    Take 1 capsule (1,000 Units total) by mouth daily.   DICLOFENAC SODIUM (VOLTAREN) 1 % GEL    Apply 2 g topically 4 (four) times daily.   FLUTICASONE (FLONASE) 50 MCG/ACT NASAL SPRAY    Place 2 sprays into both nostrils daily.   GABAPENTIN  (NEURONTIN) 100 MG CAPSULE    Take 1 capsule (100 mg total) by mouth at bedtime.   GLUCOSE BLOOD (ACCU-CHEK AVIVA PLUS) TEST STRIP    TEST BLOOD SUGAR TWICE DAILY   IRBESARTAN (AVAPRO) 75 MG TABLET    TAKE 1 TABLET BY MOUTH EVERY DAY   MECLIZINE (ANTIVERT) 12.5 MG TABLET    TAKE 1-2 TABLETS (12.5-25 MG TOTAL) BY MOUTH 3 (THREE) TIMES DAILY AS NEEDED FOR DIZZINESS.   METFORMIN (GLUCOPHAGE) 1000 MG TABLET    TAKE 1 BY MOUTH TWO TIMES DAILY FOR BLOOD SUGAR E11.42   OMEPRAZOLE (PRILOSEC) 20 MG CAPSULE    TAKE 1 CAPSULE EVERY DAY   POLYETHYLENE GLYCOL POWDER (GLYCOLAX/MIRALAX) 17 GM/SCOOP POWDER    Take 17 g by mouth daily.   SERTRALINE (ZOLOFT) 25 MG TABLET    TAKE 1 TABLET EVERY DAY   SIMVASTATIN (ZOCOR) 10 MG TABLET    TAKE 1 TABLET (10 MG TOTAL) BY MOUTH DAILY AT 6 PM.   SITAGLIPTIN (JANUVIA) 100 MG TABLET    Take 1 tablet (100 mg total) by mouth daily.   TIZANIDINE (ZANAFLEX) 2 MG TABLET    Take 1-2 tablets (2-4 mg total) by mouth every 6 (six) hours as needed for muscle spasms.  Modified Medications   No medications on file  Discontinued Medications   No medications on file    Physical Exam:  Vitals:   12/04/20 1003  BP: 119/78  Pulse: 77  Temp: (!) 97.5 F (36.4 C)  SpO2: 97%  Weight: 162 lb (73.5 kg)  Height: _0  (1.778 m)   Body mass index is 23.24 kg/m. Wt Readings from Last 3 Encounters:  12/04/20 162 lb (73.5 kg)  07/29/20 155 lb 3.2 oz (70.4 kg)  06/24/20 151 lb (68.5 kg)    Physical Exam Constitutional:      General: He is not in acute distress.    Appearance: He is well-developed. He is not diaphoretic.  HENT:     Head: Normocephalic and atraumatic.     Right Ear: Tympanic membrane, ear canal and external ear normal. There is impacted cerumen.     Left Ear: Tympanic membrane and external ear normal. There is impacted cerumen.     Mouth/Throat:     Pharynx: No oropharyngeal exudate.  Eyes:     Conjunctiva/sclera: Conjunctivae normal.     Pupils: Pupils are  equal, round, and reactive to light.  Cardiovascular:     Rate and Rhythm: Normal rate and regular rhythm.     Heart sounds: Normal heart sounds.  Pulmonary:     Effort: Pulmonary effort is normal.  Breath sounds: Normal breath sounds.  Abdominal:     General: Bowel sounds are normal.     Palpations: Abdomen is soft.  Musculoskeletal:        General: No tenderness.     Cervical back: Normal range of motion and neck supple.     Right lower leg: No edema.     Left lower leg: No edema.  Skin:    General: Skin is warm and dry.  Neurological:     Mental Status: He is alert and oriented to person, place, and time.    Labs reviewed: Basic Metabolic Panel: Recent Labs    03/23/20 1114 07/24/20 0806 12/02/20 0819  NA 138 140 139  K 4.2 4.1 4.5  CL 100 104 102  CO2 _0 GLUCOSE 100* 84 136  BUN _1 CREATININE 1.10 1.17 1.26  CALCIUM 9.7 9.6 10.0  TSH 1.370  --   --    Liver Function Tests: Recent Labs    03/23/20 1114 07/24/20 0806 12/02/20 0819  AST _2 ALT 7 8* 11  ALKPHOS 58  --   --   BILITOT 0.9 0.5 0.5  PROT 7.1 6.4 7.3  ALBUMIN 4.4  --   --    No results for input(s): LIPASE, AMYLASE in the last 8760 hours. No results for input(s): AMMONIA in the last 8760 hours. CBC: Recent Labs    02/24/20 1458 03/18/20 1206 07/24/20 0806  WBC 3.5* 5.3 3.8  NEUTROABS 1,642 3,265 2,056  HGB 15.0 16.1 13.4  HCT 44.4 46.6 40.5  MCV 87.1 86.6 90.0  PLT 173 196 200   Lipid Panel: Recent Labs    07/24/20 0806  CHOL 110  HDL 36*  LDLCALC 56  TRIG 92  CHOLHDL 3.1   TSH: Recent Labs    03/23/20 1114  TSH 1.370   A1C: Lab Results  Component Value Date   HGBA1C 7.0 (H) 12/02/2020     Assessment/Plan 1. Vitamin B12 deficiency -continue with b12 supplement.   2. DM type 2 with diabetic peripheral neuropathy (HCC) -a1c at 7, up from prior but he has stopped [place] due to cost. Continues on metformin bid. Will have him continue dietayr  modifications and metformin at this time.  Encouraged dietary compliance, routine foot care/monitoring and to keep up with diabetic eye exams through ophthalmology - Hemoglobin A1c; Future  3. Essential hypertension -Blood pressure well controlled Continue current medications Recheck metabolic panel - CBC with Differential/Platelet; Future - CMP with eGFR([facility]); Future  4. Acute pain of right shoulder -doing better with PT. Continues on gabapentin which also helps pain.   5. Anxiety -well controlled on zoloft.   6. Need for influenza vaccination - Flu Vaccine QUAD High Dose(Fluad)  7. Bilateral impacted cerumen -ear lavage completed today with good results. Cleared both ear canals.   8. Dizziness Has resolved at this time.   9. Gastroesophageal reflux disease without esophagitis Controlled on omeprazole, continue with dietary modifications.    Next appt: 3 months, labs prior.  Carlos American. [place], [place] [facility] [phone]

## 2020-12-04 NOTE — Patient Instructions (Addendum)
To make sure you are taking b12 1000 mcg daily  Okay to stay off januvia- continue with good dietary habits.

## 2020-12-15 ENCOUNTER — Other Ambulatory Visit: Payer: Self-pay | Admitting: Nurse Practitioner

## 2020-12-15 DIAGNOSIS — E559 Vitamin D deficiency, unspecified: Secondary | ICD-10-CM

## 2020-12-31 ENCOUNTER — Telehealth: Payer: Self-pay

## 2020-12-31 ENCOUNTER — Ambulatory Visit (INDEPENDENT_AMBULATORY_CARE_PROVIDER_SITE_OTHER): Payer: Medicare HMO | Admitting: Nurse Practitioner

## 2020-12-31 ENCOUNTER — Encounter: Payer: Self-pay | Admitting: Nurse Practitioner

## 2020-12-31 ENCOUNTER — Other Ambulatory Visit: Payer: Self-pay

## 2020-12-31 DIAGNOSIS — Z1212 Encounter for screening for malignant neoplasm of rectum: Secondary | ICD-10-CM | POA: Diagnosis not present

## 2020-12-31 DIAGNOSIS — Z1211 Encounter for screening for malignant neoplasm of colon: Secondary | ICD-10-CM | POA: Diagnosis not present

## 2020-12-31 DIAGNOSIS — Z0000 Encounter for general adult medical examination without abnormal findings: Secondary | ICD-10-CM

## 2020-12-31 NOTE — Telephone Encounter (Signed)
Mr. elmond, poehlman are scheduled for a virtual visit with your provider today.    Just as we do with appointments in the office, we must obtain your consent to participate.  Your consent will be active for this visit and any virtual visit you may have with one of our providers in the next 365 days.    If you have a MyChart account, I can also send a copy of this consent to you electronically.  All virtual visits are billed to your insurance company just like a traditional visit in the office.  As this is a virtual visit, video technology does not allow for your provider to perform a traditional examination.  This may limit your provider's ability to fully assess your condition.  If your provider identifies any concerns that need to be evaluated in person or the need to arrange testing such as labs, EKG, etc, we will make arrangements to do so.    Although advances in technology are sophisticated, we cannot ensure that it will always work on either your end or our end.  If the connection with a video visit is poor, we may have to switch to a telephone visit.  With either a video or telephone visit, we are not always able to ensure that we have a secure connection.   I need to obtain your verbal consent now.   Are you willing to proceed with your visit today?   Arnol Mcgibbon has provided verbal consent on 12/31/2020 for a virtual visit (video or telephone).   Elveria Royals, CMA 12/31/2020  9:24 AM

## 2020-12-31 NOTE — Progress Notes (Signed)
Subjective:   Danny Winters is a 75 y.o. male who presents for Medicare Annual/Subsequent preventive examination.  Review of Systems     Cardiac Risk Factors include: advanced age (>27men, >26 women);family history of premature cardiovascular disease;male gender;diabetes mellitus;dyslipidemia;hypertension;sedentary lifestyle     Objective:    There were no vitals filed for this visit. There is no height or weight on file to calculate BMI.  Advanced Directives 12/31/2020 12/04/2020 07/29/2020 04/29/2020 04/06/2020 03/18/2020 02/24/2020  Does Patient Have a Medical Advance Directive? No No No No No No No  Does patient want to make changes to medical advance directive? - - - Yes (MAU/Ambulatory/Procedural Areas - Information given) - - -  Would patient like information on creating a medical advance directive? - - No - Patient declined - No - Patient declined No - Patient declined No - Patient declined  Pre-existing out of facility DNR order (yellow form or pink MOST form) - - - - - - -    Current Medications (verified) Outpatient Encounter Medications as of 12/31/2020  Medication Sig   Accu-Chek Softclix Lancets lancets USE TO TEST BLOOD SUGAR TWICE DAILY. Dx:E11.42   Alcohol Swabs (B-D SINGLE USE SWABS REGULAR) PADS USE DAILY AS DIRECTED WHEN CHECKING BLOOD SUGAR   aspirin 81 MG tablet Take 81 mg by mouth daily.   Cholecalciferol (VITAMIN D3) 25 MCG (1000 UT) CAPS Take 1 capsule (1,000 Units total) by mouth daily.   diclofenac Sodium (VOLTAREN) 1 % GEL Apply 2 g topically 4 (four) times daily.   fluticasone (FLONASE) 50 MCG/ACT nasal spray Place 2 sprays into both nostrils daily.   gabapentin (NEURONTIN) 100 MG capsule Take 1 capsule (100 mg total) by mouth at bedtime.   glucose blood (ACCU-CHEK AVIVA PLUS) test strip TEST BLOOD SUGAR TWICE DAILY   irbesartan (AVAPRO) 75 MG tablet TAKE 1 TABLET BY MOUTH EVERY DAY   meclizine (ANTIVERT) 12.5 MG tablet TAKE 1-2 TABLETS (12.5-25 MG TOTAL) BY  MOUTH 3 (THREE) TIMES DAILY AS NEEDED FOR DIZZINESS.   metFORMIN (GLUCOPHAGE) 1000 MG tablet TAKE 1 BY MOUTH TWO TIMES DAILY FOR BLOOD SUGAR E11.42   omeprazole (PRILOSEC) 20 MG capsule TAKE 1 CAPSULE EVERY DAY   polyethylene glycol powder (GLYCOLAX/MIRALAX) 17 GM/SCOOP powder Take 17 g by mouth daily.   simvastatin (ZOCOR) 10 MG tablet TAKE 1 TABLET (10 MG TOTAL) BY MOUTH DAILY AT 6 PM.   [DISCONTINUED] sertraline (ZOLOFT) 25 MG tablet TAKE 1 TABLET EVERY DAY   No facility-administered encounter medications on file as of 12/31/2020.    Allergies (verified) Lisinopril   History: Past Medical History:  Diagnosis Date   GERD (gastroesophageal reflux disease)    H/O degenerative disc disease    Lumbago    Memory loss    Other and unspecified hyperlipidemia    Type II or unspecified type diabetes mellitus without mention of complication, uncontrolled    Unspecified arthropathy, shoulder region    Unspecified essential hypertension    Unspecified hereditary and idiopathic peripheral neuropathy    Unspecified vitamin D deficiency    Past Surgical History:  Procedure Laterality Date   COLONOSCOPY  2005   Dr Worthy Flank Medoff   COLONOSCOPY W/ POLYPECTOMY  2012   [facility]- sessile polyp in transverse colon 4 mm and another 6 mm   INGUINAL HERNIA REPAIR  2019   KIDNEY STONE SURGERY  2013   Dr Ihor Gully   Family History  Problem Relation Age of Onset   Heart disease Mother  Heart attack Father    Cancer Sister        colon   Cancer Brother        colon   Cancer Brother        colon   Cancer Brother        prostate   Cancer Brother        prostate   Cancer Brother        Pancreas   Stroke Other    Social History   Socioeconomic History   Marital status: Divorced    Spouse name: Not on file   Number of children: Not on file   Years of education: Not on file   Highest education level: Not on file  Occupational History   Not on file  Tobacco Use   Smoking  status: Former    Packs/day: 1.00    Years: 11.00    Pack years: 11.00    Types: Cigarettes    Quit date: 07/01/1973    Years since quitting: 47.5   Smokeless tobacco: Never  Vaping Use   Vaping Use: Never used  Substance and Sexual Activity   Alcohol use: Not Currently    Comment: none x 50 yrs   Drug use: No   Sexual activity: Not on file  Other Topics Concern   Not on file  Social History Narrative   Not on file   Social Determinants of Health   Financial Resource Strain: Not on file  Food Insecurity: Not on file  Transportation Needs: Not on file  Physical Activity: Not on file  Stress: Not on file  Social Connections: Not on file    Tobacco Counseling Counseling given: Not Answered   Clinical Intake:     Pain : 0-10 Pain Type: Chronic pain Pain Location: Back Pain Orientation: Lower Pain Descriptors / Indicators: Aching Pain Onset: More than a month ago Pain Frequency: Constant     BMI - recorded: 23 Diabetes: Yes  How often do you need to have someone help you when you read instructions, pamphlets, or other written materials from your doctor or pharmacy?: 3 - Sometimes  Diabetic?yes         Activities of Daily Living In your present state of health, do you have any difficulty performing the following activities: 12/31/2020  Hearing? N  Vision? N  Difficulty concentrating or making decisions? Y  Walking or climbing stairs? N  Dressing or bathing? N  Doing errands, shopping? N  Preparing Food and eating ? N  Using the Toilet? N  In the past six months, have you accidently leaked urine? Y  Do you have problems with loss of bowel control? Y  Managing your Medications? N  Managing your Finances? N  Housekeeping or managing your Housekeeping? N  Some recent data might be hidden    Patient Care Team: Sharon Seller, NP as PCP - General (Geriatric Medicine) Jethro Bolus, MD as Consulting Physician (Ophthalmology)  Indicate any recent  Medical Services you may have received from other than [facility] providers in the past year (date may be approximate).     Assessment:   This is a routine wellness examination for Danny Winters.  Hearing/Vision screen Hearing Screening - Comments:: No hearing problems. Vision Screening - Comments:: No vision problems. Last eye exam was last year. Patient sees Dr. Nile Riggs.  Dietary issues and exercise activities discussed: Current Exercise Habits: The patient does not participate in regular exercise at present   Goals Addressed   None  Depression Screen PHQ 2/9 Scores 12/31/2020 12/30/2019 10/30/2019 04/30/2019 12/26/2018 12/18/2017 12/18/2017  PHQ - 2 Score 0 0 0 0 0 0 0    Fall Risk Fall Risk  12/31/2020 12/04/2020 04/29/2020 03/18/2020 02/24/2020  Falls in the past year? 1 1 0 0 0  Comment - Patient slipped on water - - -  Number falls in past yr: 1 1 0 0 0  Comment - - - - -  Injury with Fall? 0 0 0 0 0  Risk for fall due to : History of fall(s) Other (Comment) - - -  Follow up Falls evaluation completed - - - -    FALL RISK PREVENTION PERTAINING TO THE HOME:  Any stairs in or around the home? Yes  If so, are there any without handrails? Yes  Home free of loose throw rugs in walkways, pet beds, electrical cords, etc? Yes  Adequate lighting in your home to reduce risk of falls? Yes   ASSISTIVE DEVICES UTILIZED TO PREVENT FALLS:  Life alert? No  Use of a cane, walker or w/c? No  Grab bars in the bathroom? No  Shower chair or bench in shower? No  Elevated toilet seat or a handicapped toilet? No   TIMED UP AND GO:  Was the test performed? Yes .    Cognitive Function: MMSE - Mini Mental State Exam 12/18/2017 08/12/2016 07/17/2013  Orientation to time Orientation to Place Registration Attention/ Calculation Recall 2 0 1  Language- name 2 objects Language- repeat Language- follow 3 step command Language- read & follow direction Write a sentence Copy design Total score 6CIT Screen 12/31/2020 12/30/2019 12/26/2018  What Year? 0 points 0 points 0 points  What month? 0 points 0 points 0 points  What time? 0 points 0 points 0 points  Count back from 20 0 points 0 points 0 points  Months in reverse 4 points 4 points 0 points  Repeat phrase 6 points 10 points 4 points  Total Score Immunizations Immunization History  Administered Date(s) Administered   Fluad Quad(high Dose 65+) 09/21/2018, 10/30/2019, 12/04/2020   Influenza, High Dose Seasonal PF 12/18/2017   Influenza,inj,Quad PF,6+ Mos 11/13/2012, 11/20/2013, 01/23/2015, 11/27/2015, 04/18/2017   Influenza-Unspecified 11/10/2011   PFIZER(Purple Top)SARS-COV-2 Vaccination 08/20/2019, 09/10/2019   Pneumococcal Conjugate-13 04/08/2014   Pneumococcal Polysaccharide-23 10/01/2008, 11/13/2012   Tdap 01/24/2010    TDAP status: Due, Education has been provided regarding the importance of this vaccine. Advised may receive this vaccine at local pharmacy or Health Dept. Aware to provide a copy of the vaccination record if obtained from local pharmacy or Health Dept. Verbalized acceptance and understanding.  Flu Vaccine status: Up to date  Pneumococcal vaccine status: Up to date  Covid-19 vaccine status: Declined, Education has been provided regarding the importance of this vaccine but patient still declined. Advised may receive this vaccine at local pharmacy or Health Dept.or vaccine clinic. Aware to provide a copy of the vaccination record if obtained from local pharmacy or Health Dept. Verbalized acceptance and understanding.  Qualifies for Shingles Vaccine? Yes   Zostavax completed Yes   Shingrix Completed?: No.    Education has been provided regarding the importance of this vaccine. Patient has  been advised to call insurance company to determine out of pocket expense if they have not yet received this vaccine. Advised may also  receive vaccine at local pharmacy or Health Dept. Verbalized acceptance and understanding.  Screening Tests Health Maintenance  Topic Date Due   Zoster Vaccines- Shingrix (1 of 2) Never done   TETANUS/TDAP  01/25/2020   FOOT EXAM  10/29/2020   COLONOSCOPY (Pts 45-17yrs Insurance coverage will need to be confirmed)  12/13/2020   HEMOGLOBIN A1C  06/01/2021   OPHTHALMOLOGY EXAM  06/23/2021   Pneumonia Vaccine 30+ Years old  Completed   INFLUENZA VACCINE  Completed   HPV VACCINES  Aged Out   COVID-19 Vaccine  Discontinued   Hepatitis C Screening  Discontinued    Health Maintenance  Health Maintenance Due  Topic Date Due   Zoster Vaccines- Shingrix (1 of 2) Never done   TETANUS/TDAP  01/25/2020   FOOT EXAM  10/29/2020   COLONOSCOPY (Pts 45-79yrs Insurance coverage will need to be confirmed)  12/13/2020    Colorectal cancer screening: Type of screening: Colonoscopy. Completed 2012. Repeat every 10 years  Lung Cancer Screening: (Low Dose CT Chest recommended if Age 61-80 years, 30 pack-year currently smoking OR have quit w/in 15years.) does not qualify.   Lung Cancer Screening Referral: na  Additional Screening:  Hepatitis C Screening: does qualify; Completed   Vision Screening: Recommended annual ophthalmology exams for early detection of glaucoma and other disorders of the eye. Is the patient up to date with their annual eye exam?  Yes  Who is the provider or what is the name of the office in which the patient attends annual eye exams? [facility] If pt is not established with a provider, would they like to be referred to a provider to establish care? No .   Dental Screening: Recommended annual dental exams for proper oral hygiene  Community Resource Referral / Chronic Care Management: CRR required this visit?  No   CCM required this visit?  No      Plan:     I have personally reviewed and noted the following in the patient's chart:   Medical and social history Use of  alcohol, tobacco or illicit drugs  Current medications and supplements including opioid prescriptions. Patient is not currently taking opioid prescriptions. Functional ability and status Nutritional status Physical activity Advanced directives List of other physicians Hospitalizations, surgeries, and ER visits in previous 12 months Vitals Screenings to include cognitive, depression, and falls Referrals and appointments  In addition, I have reviewed and discussed with patient certain preventive protocols, quality metrics, and best practice recommendations. A written personalized care plan for preventive services as well as general preventive health recommendations were provided to patient.     Sharon Seller, NP   12/31/2020    Virtual Visit via Telephone Note  I connected withNAME@ on 12/31/20 at  9:00 AM EST by telephone and verified that I am speaking with the correct person using two identifiers.  Location: Patient: home  Provider: [facility]   I discussed the limitations, risks, security and privacy concerns of performing an evaluation and management service by telephone and the availability of in person appointments. I also discussed with the patient that there may be a patient responsible charge related to this service. The patient expressed understanding and agreed to proceed.   I discussed the assessment and treatment plan with the patient. The patient was provided an opportunity to ask questions and all were answered. The patient agreed  with the plan and demonstrated an understanding of the instructions.   The patient was advised to call back or seek an in-person evaluation if the symptoms worsen or if the condition fails to improve as anticipated.  I provided 16 minutes of non-face-to-face time during this encounter.  Janene Harvey. [employer]ag[employer] printed and mailed

## 2020-12-31 NOTE — Progress Notes (Signed)
This service is provided via telemedicine  No vital signs collected/recorded due to the encounter was a telemedicine visit.   Location of patient (ex: home, work):  Home  Patient consents to a telephone visit:  Yes, see encounter dated 12/31/2020  Location of the provider (ex: office, home):  [facility]  Name of any referring provider:  N/A  Names of all persons participating in the telemedicine service and their role in the encounter:  Abbey Chatters, Nurse Practitioner, Elveria Royals, CMA, and patient.   Time spent on call:  12 minutes with medical assistant

## 2020-12-31 NOTE — Patient Instructions (Signed)
Danny Winters , Thank you for taking time to come for your Medicare Wellness Visit. I appreciate your ongoing commitment to your health goals. Please review the following plan we discussed and let me know if I can assist you in the future.   Screening recommendations/referrals: Colonoscopy referral placed Recommended yearly ophthalmology/optometry visit for glaucoma screening and checkup Recommended yearly dental visit for hygiene and checkup  Vaccinations: Influenza vaccine up to date Pneumococcal vaccine up to date Tdap vaccine RECOMMENDED- to get at local pharmacy Shingles vaccine RECOMMENDED- to get at local pharmacy    Advanced directives: recommended to complete.   Conditions/risks identified: complications related to diabetes, htn, hyperlipidemia, risk for heart disease, progressive memory loss.   Next appointment: yearly  Preventive Care 41 Years and Older, Male Preventive care refers to lifestyle choices and visits with your health care provider that can promote health and wellness. What does preventive care include? A yearly physical exam. This is also called an annual well check. Dental exams once or twice a year. Routine eye exams. Ask your health care provider how often you should have your eyes checked. Personal lifestyle choices, including: Daily care of your teeth and gums. Regular physical activity. Eating a healthy diet. Avoiding tobacco and drug use. Limiting alcohol use. Practicing safe sex. Taking low doses of aspirin every day. Taking vitamin and mineral supplements as recommended by your health care provider. What happens during an annual well check? The services and screenings done by your health care provider during your annual well check will depend on your age, overall health, lifestyle risk factors, and family history of disease. Counseling  Your health care provider may ask you questions about your: Alcohol use. Tobacco use. Drug use. Emotional  well-being. Home and relationship well-being. Sexual activity. Eating habits. History of falls. Memory and ability to understand (cognition). Work and work Astronomer. Screening  You may have the following tests or measurements: Height, weight, and BMI. Blood pressure. Lipid and cholesterol levels. These may be checked every 5 years, or more frequently if you are over 93 years old. Skin check. Lung cancer screening. You may have this screening every year starting at age 2 if you have a 30-pack-year history of smoking and currently smoke or have quit within the past 15 years. Fecal occult blood test (FOBT) of the stool. You may have this test every year starting at age 40. Flexible sigmoidoscopy or colonoscopy. You may have a sigmoidoscopy every 5 years or a colonoscopy every 10 years starting at age 34. Prostate cancer screening. Recommendations will vary depending on your family history and other risks. Hepatitis C blood test. Hepatitis B blood test. Sexually transmitted disease (STD) testing. Diabetes screening. This is done by checking your blood sugar (glucose) after you have not eaten for a while (fasting). You may have this done every 1-3 years. Abdominal aortic aneurysm (AAA) screening. You may need this if you are a current or former smoker. Osteoporosis. You may be screened starting at age 57 if you are at high risk. Talk with your health care provider about your test results, treatment options, and if necessary, the need for more tests. Vaccines  Your health care provider may recommend certain vaccines, such as: Influenza vaccine. This is recommended every year. Tetanus, diphtheria, and acellular pertussis (Tdap, Td) vaccine. You may need a Td booster every 10 years. Zoster vaccine. You may need this after age 47. Pneumococcal 13-valent conjugate (PCV13) vaccine. One dose is recommended after age 5. Pneumococcal polysaccharide (PPSV23) vaccine.  One dose is recommended after  age 47. Talk to your health care provider about which screenings and vaccines you need and how often you need them. This information is not intended to replace advice given to you by your health care provider. Make sure you discuss any questions you have with your health care provider. Document Released: 02/06/2015 Document Revised: 09/30/2015 Document Reviewed: 11/11/2014 Elsevier Interactive Patient Education  2017 [place] Prevention in the Home Falls can cause injuries. They can happen to people of all ages. There are many things you can do to make your home safe and to help prevent falls. What can I do on the outside of my home? Regularly fix the edges of walkways and driveways and fix any cracks. Remove anything that might make you trip as you walk through a door, such as a raised step or threshold. Trim any bushes or trees on the path to your home. Use bright outdoor lighting. Clear any walking paths of anything that might make someone trip, such as rocks or tools. Regularly check to see if handrails are loose or broken. Make sure that both sides of any steps have handrails. Any raised decks and porches should have guardrails on the edges. Have any leaves, snow, or ice cleared regularly. Use sand or salt on walking paths during winter. Clean up any spills in your garage right away. This includes oil or grease spills. What can I do in the bathroom? Use night lights. Install grab bars by the toilet and in the tub and shower. Do not use towel bars as grab bars. Use non-skid mats or decals in the tub or shower. If you need to sit down in the shower, use a plastic, non-slip stool. Keep the floor dry. Clean up any water that spills on the floor as soon as it happens. Remove soap buildup in the tub or shower regularly. Attach bath mats securely with double-sided non-slip rug tape. Do not have throw rugs and other things on the floor that can make you trip. What can I do in the  bedroom? Use night lights. Make sure that you have a light by your bed that is easy to reach. Do not use any sheets or blankets that are too big for your bed. They should not hang down onto the floor. Have a firm chair that has side arms. You can use this for support while you get dressed. Do not have throw rugs and other things on the floor that can make you trip. What can I do in the kitchen? Clean up any spills right away. Avoid walking on wet floors. Keep items that you use a lot in easy-to-reach places. If you need to reach something above you, use a strong step stool that has a grab bar. Keep electrical cords out of the way. Do not use floor polish or wax that makes floors slippery. If you must use wax, use non-skid floor wax. Do not have throw rugs and other things on the floor that can make you trip. What can I do with my stairs? Do not leave any items on the stairs. Make sure that there are handrails on both sides of the stairs and use them. Fix handrails that are broken or loose. Make sure that handrails are as long as the stairways. Check any carpeting to make sure that it is firmly attached to the stairs. Fix any carpet that is loose or worn. Avoid having throw rugs at the top or bottom of  the stairs. If you do have throw rugs, attach them to the floor with carpet tape. Make sure that you have a light switch at the top of the stairs and the bottom of the stairs. If you do not have them, ask someone to add them for you. What else can I do to help prevent falls? Wear shoes that: Do not have high heels. Have rubber bottoms. Are comfortable and fit you well. Are closed at the toe. Do not wear sandals. If you use a stepladder: Make sure that it is fully opened. Do not climb a closed stepladder. Make sure that both sides of the stepladder are locked into place. Ask someone to hold it for you, if possible. Clearly mark and make sure that you can see: Any grab bars or  handrails. First and last steps. Where the edge of each step is. Use tools that help you move around (mobility aids) if they are needed. These include: Canes. Walkers. Scooters. Crutches. Turn on the lights when you go into a dark area. Replace any light bulbs as soon as they burn out. Set up your furniture so you have a clear path. Avoid moving your furniture around. If any of your floors are uneven, fix them. If there are any pets around you, be aware of where they are. Review your medicines with your doctor. Some medicines can make you feel dizzy. This can increase your chance of falling. Ask your doctor what other things that you can do to help prevent falls. This information is not intended to replace advice given to you by your health care provider. Make sure you discuss any questions you have with your health care provider. Document Released: 11/06/2008 Document Revised: 06/18/2015 Document Reviewed: 02/14/2014 Elsevier Interactive Patient Education  2017 [employer].

## 2021-01-12 ENCOUNTER — Other Ambulatory Visit: Payer: Self-pay | Admitting: Nurse Practitioner

## 2021-01-12 DIAGNOSIS — E559 Vitamin D deficiency, unspecified: Secondary | ICD-10-CM

## 2021-02-25 DIAGNOSIS — E538 Deficiency of other specified B group vitamins: Secondary | ICD-10-CM | POA: Insufficient documentation

## 2021-02-25 DIAGNOSIS — K219 Gastro-esophageal reflux disease without esophagitis: Secondary | ICD-10-CM | POA: Insufficient documentation

## 2021-02-26 ENCOUNTER — Other Ambulatory Visit: Payer: Self-pay

## 2021-02-26 ENCOUNTER — Encounter: Payer: Self-pay | Admitting: Nurse Practitioner

## 2021-02-26 ENCOUNTER — Ambulatory Visit (INDEPENDENT_AMBULATORY_CARE_PROVIDER_SITE_OTHER): Payer: Medicare HMO | Admitting: Nurse Practitioner

## 2021-02-26 VITALS — BP 152/80 | HR 86 | Temp 97.7°F | Ht 70.0 in | Wt 159.0 lb

## 2021-02-26 DIAGNOSIS — E538 Deficiency of other specified B group vitamins: Secondary | ICD-10-CM | POA: Diagnosis not present

## 2021-02-26 DIAGNOSIS — K219 Gastro-esophageal reflux disease without esophagitis: Secondary | ICD-10-CM

## 2021-02-26 DIAGNOSIS — K5904 Chronic idiopathic constipation: Secondary | ICD-10-CM | POA: Diagnosis not present

## 2021-02-26 DIAGNOSIS — E0843 Diabetes mellitus due to underlying condition with diabetic autonomic (poly)neuropathy: Secondary | ICD-10-CM | POA: Diagnosis not present

## 2021-02-26 DIAGNOSIS — E1142 Type 2 diabetes mellitus with diabetic polyneuropathy: Secondary | ICD-10-CM

## 2021-02-26 DIAGNOSIS — F419 Anxiety disorder, unspecified: Secondary | ICD-10-CM

## 2021-02-26 DIAGNOSIS — E559 Vitamin D deficiency, unspecified: Secondary | ICD-10-CM

## 2021-02-26 DIAGNOSIS — M159 Polyosteoarthritis, unspecified: Secondary | ICD-10-CM | POA: Diagnosis not present

## 2021-02-26 DIAGNOSIS — E785 Hyperlipidemia, unspecified: Secondary | ICD-10-CM

## 2021-02-26 DIAGNOSIS — I10 Essential (primary) hypertension: Secondary | ICD-10-CM

## 2021-02-26 DIAGNOSIS — M19011 Primary osteoarthritis, right shoulder: Secondary | ICD-10-CM

## 2021-02-26 MED ORDER — BLOOD PRESSURE CUFF MISC: 0 refills | Status: DC

## 2021-02-26 MED ORDER — DICLOFENAC SODIUM 1 % EX GEL: 2.0000 g | Freq: Four times a day (QID) | CUTANEOUS | 3 refills | Status: DC

## 2021-02-26 NOTE — Progress Notes (Signed)
Careteam: Patient Care Team: Lauree Chandler, NP as PCP - General (Geriatric Medicine) Rutherford Guys, MD as Consulting Physician (Ophthalmology)  PLACE OF SERVICE:  [place] Directive information    Allergies  Allergen Reactions   Lisinopril Itching    Chief Complaint  Patient presents with   Medical Management of Chronic Issues    Routine visit. Patient denies receiving any vaccines since last visit. Discuss need for shingrix and td/tdap vaccine or post pone if patient refuses. Discuss medication for dizziness and reoccurring dizzy episodes       HPI: Patient is a 76 y.o. male for routine follow up. Reports dizziness comes back every now and then.  Reports he needs new glasses.   DM-No low blood sugars, checks daily. Reports compliance with medication. Reports he is eating less.   Neuropathy- has numbness to feet but stable on gabapentin.   Htn- has not taken blood pressure meds today, does not have cuff to check at home  Hyperlipidemia- LDL at goal on last labs, continues on zocor.   Osteoarthritis- ongoing pain to knees, low back. Tylenol not effective. Has used Voltaren which helps.   Review of Systems:  Review of Systems  Constitutional:  Negative for chills, fever and weight loss.  HENT:  Negative for congestion and tinnitus.   Respiratory:  Negative for cough, sputum production and shortness of breath.   Cardiovascular:  Negative for chest pain, palpitations and leg swelling.  Gastrointestinal:  Negative for abdominal pain, constipation, diarrhea and heartburn.  Genitourinary:  Negative for dysuria, frequency and urgency.  Musculoskeletal:  Positive for joint pain. Negative for back pain, falls and myalgias.  Skin: Negative.   Neurological:  Positive for dizziness. Negative for headaches.  Psychiatric/Behavioral:  Negative for depression and memory loss. The patient does not have insomnia.    Past Medical History:  Diagnosis Date   GERD  (gastroesophageal reflux disease)    H/O degenerative disc disease    Lumbago    Memory loss    Other and unspecified hyperlipidemia    Type II or unspecified type diabetes mellitus without mention of complication, uncontrolled    Unspecified arthropathy, shoulder region    Unspecified essential hypertension    Unspecified hereditary and idiopathic peripheral neuropathy    Unspecified vitamin D deficiency    Past Surgical History:  Procedure Laterality Date   COLONOSCOPY  2005   Dr Sharlotte [place] Medoff   COLONOSCOPY W/ POLYPECTOMY  2012   [facility]- sessile polyp in transverse colon 4 mm and another 6 mm   INGUINAL HERNIA REPAIR  2019   KIDNEY STONE SURGERY  2013   Dr Kathie Rhodes   Social History:   reports that he quit smoking about 47 years ago. His smoking use included cigarettes. He has a 11.00 pack-year smoking history. He has never used smokeless tobacco. He reports that he does not currently use alcohol. He reports that he does not use drugs.  Family History  Problem Relation Age of Onset   Heart disease Mother    Heart attack Father    Cancer Sister        colon   Cancer Brother        colon   Cancer Brother        colon   Cancer Brother        prostate   Cancer Brother        prostate   Cancer Brother  Pancreas   Stroke Other     Medications: Patient's Medications  New Prescriptions   BLOOD PRESSURE MONITORING (BLOOD PRESSURE CUFF) MISC    To check blood pressure three times weekly after medication dx) htn, I10  Previous Medications   ACCU-CHEK SOFTCLIX LANCETS LANCETS    USE TO TEST BLOOD SUGAR TWICE DAILY. Dx:E11.42   ALCOHOL SWABS (B-D SINGLE USE SWABS REGULAR) PADS    USE DAILY AS DIRECTED WHEN CHECKING BLOOD SUGAR   ASPIRIN 81 MG TABLET    Take 81 mg by mouth daily.   CHOLECALCIFEROL (VITAMIN D3) 25 MCG (1000 UT) CAPS    Take 1 capsule (1,000 Units total) by mouth daily.   FLUTICASONE (FLONASE) 50 MCG/ACT NASAL SPRAY    Place 2 sprays into  both nostrils daily.   GABAPENTIN (NEURONTIN) 100 MG CAPSULE    Take 1 capsule (100 mg total) by mouth at bedtime.   GLUCOSE BLOOD (ACCU-CHEK AVIVA PLUS) TEST STRIP    USE TO CHECK BLOOD SUGAR TWICE DAILY.DX:E11.42   IRBESARTAN (AVAPRO) 75 MG TABLET    TAKE 1 TABLET BY MOUTH EVERY DAY   MECLIZINE (ANTIVERT) 12.5 MG TABLET    TAKE 1-2 TABLETS (12.5-25 MG TOTAL) BY MOUTH 3 (THREE) TIMES DAILY AS NEEDED FOR DIZZINESS.   METFORMIN (GLUCOPHAGE) 1000 MG TABLET    TAKE 1 BY MOUTH TWO TIMES DAILY FOR BLOOD SUGAR E11.42   OMEPRAZOLE (PRILOSEC) 20 MG CAPSULE    TAKE 1 CAPSULE EVERY DAY   POLYETHYLENE GLYCOL POWDER (GLYCOLAX/MIRALAX) 17 GM/SCOOP POWDER    Take 17 g by mouth daily.   SIMVASTATIN (ZOCOR) 10 MG TABLET    TAKE 1 TABLET (10 MG TOTAL) BY MOUTH DAILY AT 6 PM.  Modified Medications   Modified Medication Previous Medication   DICLOFENAC SODIUM (VOLTAREN) 1 % GEL diclofenac Sodium (VOLTAREN) 1 % GEL      Apply 2 g topically 4 (four) times daily.    Apply 2 g topically 4 (four) times daily.  Discontinued Medications   VITAMIN D, ERGOCALCIFEROL, (DRISDOL) 1.25 MG (50000 UNIT) CAPS CAPSULE    TAKE 1 CAPSULE BY MOUTH EVERY 7 DAYS FOR 12 WEEKS THEN SWITCH TO 2000 UNITS DAILY    Physical Exam:  Vitals:   02/26/21 0844  BP: (!) 152/80  Pulse: 86  Temp: 97.7 F (36.5 C)  TempSrc: Temporal  SpO2: 98%  Weight: 159 lb (72.1 kg)  Height: 5\' 10"  (1.778 m)   Body mass index is 22.81 kg/m. Wt Readings from Last 3 Encounters:  02/26/21 159 lb (72.1 kg)  12/04/20 162 lb (73.5 kg)  07/29/20 155 lb 3.2 oz (70.4 kg)    Physical Exam Constitutional:      General: He is not in acute distress.    Appearance: He is well-developed. He is not diaphoretic.  HENT:     Head: Normocephalic and atraumatic.     Right Ear: External ear normal.     Left Ear: External ear normal.     Mouth/Throat:     Pharynx: No oropharyngeal exudate.  Eyes:     Conjunctiva/sclera: Conjunctivae normal.     Pupils: Pupils  are equal, round, and reactive to light.  Cardiovascular:     Rate and Rhythm: Normal rate and regular rhythm.     Heart sounds: Normal heart sounds.  Pulmonary:     Effort: Pulmonary effort is normal.     Breath sounds: Normal breath sounds.  Abdominal:     General: Bowel sounds are normal.  Palpations: Abdomen is soft.  Musculoskeletal:        General: No tenderness.     Cervical back: Normal range of motion and neck supple.     Right lower leg: No edema.     Left lower leg: No edema.  Skin:    General: Skin is warm and dry.  Neurological:     Mental Status: He is alert and oriented to person, place, and time. Mental status is at baseline.  Psychiatric:        Mood and Affect: Mood normal.        Behavior: Behavior normal.    Labs reviewed: Basic Metabolic Panel: Recent Labs    03/23/20 1114 07/24/20 0806 12/02/20 0819  NA 138 140 139  K 4.2 4.1 4.5  CL 100 104 102  CO2 24 28 25   GLUCOSE 100* 84 136  BUN 18 14 21   CREATININE 1.10 1.17 1.26  CALCIUM 9.7 9.6 10.0  TSH 1.370  --   --    Liver Function Tests: Recent Labs    03/23/20 1114 07/24/20 0806 12/02/20 0819  AST 20 15 18   ALT 7 8* 11  ALKPHOS 58  --   --   BILITOT 0.9 0.5 0.5  PROT 7.1 6.4 7.3  ALBUMIN 4.4  --   --    No results for input(s): LIPASE, AMYLASE in the last 8760 hours. No results for input(s): AMMONIA in the last 8760 hours. CBC: Recent Labs    03/18/20 1206 07/24/20 0806  WBC 5.3 3.8  NEUTROABS 3,265 2,056  HGB 16.1 13.4  HCT 46.6 40.5  MCV 86.6 90.0  PLT 196 200   Lipid Panel: Recent Labs    07/24/20 0806  CHOL 110  HDL 36*  LDLCALC 56  TRIG 92  CHOLHDL 3.1   TSH: Recent Labs    03/23/20 1114  TSH 1.370   A1C: Lab Results  Component Value Date   HGBA1C 7.0 (H) 12/02/2020     Assessment/Plan 1. Essential hypertension -elevated today but did not take medication.  Will have him follow up in 2 weeks with home bp readings and to get labs - Blood Pressure  Monitoring (BLOOD PRESSURE CUFF) MISC; To check blood pressure three times weekly after medication dx) htn, I10  Dispense: 1 each; Refill: 0  2. 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 DM type 2 with diabetic peripheral neuropathy ([place]) -Encouraged dietary compliance, routine foot care/monitoring and to keep up with diabetic eye exams through ophthalmology  -will get A1c at next visit.   4. Vitamin B12 deficiency -continue on supplement.   5. Vitamin D deficiency Continue on supplement.   6. Hyperlipidemia LDL goal <70 Continue dietary modification with statin.   7. Gastroesophageal reflux disease without esophagitis -stable, will have occasional symptoms, continues on omeprazole. Dietary modification encouraged to limit break though events.   8. Chronic idiopathic constipation Well controlled on current regimen.   9. Anxiety Well controlled, not current on medication at this time.   10. Primary osteoarthritis involving multiple joints -ongoing, continues to have pain in several joints, voltaren gel helps. Continue exercises per PT.  - diclofenac Sodium (VOLTAREN) 1 % GEL; Apply 2 g topically 4 (four) times daily.  Dispense: 100 g; Refill: 3    Return in about 2 weeks (around 03/12/2021) for blood pressure check and labs- NOT fasting.:  Leonda Cristo K. Harle Battiest  [facility]  Ad[facility] [phone]

## 2021-02-26 NOTE — Patient Instructions (Signed)
Take blood pressure medicine before next appt

## 2021-03-04 ENCOUNTER — Other Ambulatory Visit: Payer: Self-pay | Admitting: Nurse Practitioner

## 2021-03-04 DIAGNOSIS — E1142 Type 2 diabetes mellitus with diabetic polyneuropathy: Secondary | ICD-10-CM

## 2021-03-15 ENCOUNTER — Encounter: Payer: Self-pay | Admitting: Nurse Practitioner

## 2021-03-15 ENCOUNTER — Encounter: Payer: Medicare HMO | Admitting: Nurse Practitioner

## 2021-03-16 NOTE — Progress Notes (Signed)
No show for appt. 

## 2021-05-04 ENCOUNTER — Other Ambulatory Visit: Payer: Self-pay | Admitting: Nurse Practitioner

## 2021-05-04 DIAGNOSIS — E785 Hyperlipidemia, unspecified: Secondary | ICD-10-CM

## 2021-05-04 DIAGNOSIS — E559 Vitamin D deficiency, unspecified: Secondary | ICD-10-CM

## 2021-05-19 ENCOUNTER — Other Ambulatory Visit: Payer: Self-pay | Admitting: Nurse Practitioner

## 2021-05-19 DIAGNOSIS — E559 Vitamin D deficiency, unspecified: Secondary | ICD-10-CM

## 2021-06-16 ENCOUNTER — Other Ambulatory Visit: Payer: Self-pay | Admitting: Nurse Practitioner

## 2021-06-16 DIAGNOSIS — F419 Anxiety disorder, unspecified: Secondary | ICD-10-CM

## 2021-06-22 ENCOUNTER — Other Ambulatory Visit: Payer: Self-pay | Admitting: Nurse Practitioner

## 2021-06-22 DIAGNOSIS — E1142 Type 2 diabetes mellitus with diabetic polyneuropathy: Secondary | ICD-10-CM

## 2021-06-22 DIAGNOSIS — E559 Vitamin D deficiency, unspecified: Secondary | ICD-10-CM

## 2021-07-12 ENCOUNTER — Other Ambulatory Visit: Payer: Self-pay | Admitting: Nurse Practitioner

## 2021-07-12 DIAGNOSIS — E1142 Type 2 diabetes mellitus with diabetic polyneuropathy: Secondary | ICD-10-CM

## 2021-07-12 DIAGNOSIS — F419 Anxiety disorder, unspecified: Secondary | ICD-10-CM

## 2021-08-09 ENCOUNTER — Other Ambulatory Visit: Payer: Self-pay | Admitting: Nurse Practitioner

## 2021-08-09 DIAGNOSIS — E559 Vitamin D deficiency, unspecified: Secondary | ICD-10-CM

## 2021-08-28 ENCOUNTER — Other Ambulatory Visit: Payer: Self-pay | Admitting: Nurse Practitioner

## 2021-08-28 DIAGNOSIS — E559 Vitamin D deficiency, unspecified: Secondary | ICD-10-CM

## 2021-08-30 ENCOUNTER — Other Ambulatory Visit: Payer: Self-pay | Admitting: Nurse Practitioner

## 2021-08-30 DIAGNOSIS — E559 Vitamin D deficiency, unspecified: Secondary | ICD-10-CM

## 2021-09-09 ENCOUNTER — Other Ambulatory Visit: Payer: Self-pay | Admitting: Nurse Practitioner

## 2021-09-09 DIAGNOSIS — E559 Vitamin D deficiency, unspecified: Secondary | ICD-10-CM

## 2021-10-01 ENCOUNTER — Ambulatory Visit (INDEPENDENT_AMBULATORY_CARE_PROVIDER_SITE_OTHER): Payer: Medicare HMO | Admitting: Adult Health

## 2021-10-01 ENCOUNTER — Encounter: Payer: Self-pay | Admitting: Adult Health

## 2021-10-01 VITALS — BP 148/80 | HR 70 | Temp 97.3°F | Ht 67.0 in | Wt 151.0 lb

## 2021-10-01 DIAGNOSIS — R202 Paresthesia of skin: Secondary | ICD-10-CM

## 2021-10-01 DIAGNOSIS — T300 Burn of unspecified body region, unspecified degree: Secondary | ICD-10-CM | POA: Diagnosis not present

## 2021-10-01 DIAGNOSIS — R42 Dizziness and giddiness: Secondary | ICD-10-CM | POA: Diagnosis not present

## 2021-10-01 DIAGNOSIS — E1142 Type 2 diabetes mellitus with diabetic polyneuropathy: Secondary | ICD-10-CM

## 2021-10-01 MED ORDER — MECLIZINE HCL 12.5 MG PO TABS: 12.5000 mg | ORAL_TABLET | Freq: Three times a day (TID) | ORAL | 1 refills | Status: AC | PRN
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
## 2021-10-01 NOTE — Patient Instructions (Signed)
Paresthesia Paresthesia is a burning or prickling feeling. This feeling can happen in any part of the body. It often happens in the hands, arms, legs, or feet. Usually, it is not painful. In most cases, the feeling goes away in a short time and is not a sign of a serious problem. If you have paresthesia that lasts a long time, you need to see your doctor. Follow these instructions at home: Nutrition Eat a healthy diet. This includes: Eating foods that are high in fiber. These include beans, whole grains, and fresh fruits and vegetables. Limiting foods that are high in fat and sugar. These include fried or sweet foods.  Alcohol use  Do not drink alcohol if: Your doctor tells you not to drink. You are pregnant, may be pregnant, or are planning to become pregnant. If you drink alcohol: Limit how much you have to: 0-1 drink a day for women. 0-2 drinks a day for men. Know how much alcohol is in your drink. In the [place] one drink equals one 12 oz bottle of beer (355 mL), one 5 oz glass of wine (148 mL), or one 1 oz glass of hard liquor (44 mL). General instructions Take over-the-counter and prescription medicines only as told by your doctor. Do not smoke or use any products that contain nicotine or tobacco. If you need help quitting, ask your doctor. If you have diabetes, work with your doctor to make sure your blood sugar stays in a healthy range. If your feet feel numb: Check for redness, warmth, and swelling every day. Wear padded socks and comfortable shoes. These help protect your feet. Keep all follow-up visits. Contact a doctor if: You have paresthesia that gets worse or does not go away. You lose feeling (have numbness) after an injury. Your burning or prickling feeling gets worse when you walk. You have pain or cramps. You feel dizzy or you faint. You have a rash. Get help right away if: You feel weak or have new weakness in an arm or leg. You have trouble walking or  moving. You have problems speaking, understanding, or seeing. You feel confused. You cannot control when you pee (urinate) or poop (have a bowel movement). These symptoms may be an emergency. Get help right away. Call 911. Do not wait to see if the symptoms will go away. Do not drive yourself to the hospital. Summary Paresthesia is a burning or prickling feeling. It often happens in the hands, arms, legs, or feet. In most cases, the feeling goes away in a short time and is not a sign of a serious problem. If you have paresthesia that lasts a long time, you need to be seen by your doctor. This information is not intended to replace advice given to you by your health care provider. Make sure you discuss any questions you have with your health care provider. Document Revised: 09/21/2020 Document Reviewed: 09/21/2020 Elsevier Patient Education  2023 Elsevier Inc.  

## 2021-10-17 ENCOUNTER — Other Ambulatory Visit: Payer: Self-pay | Admitting: Nurse Practitioner

## 2021-10-17 DIAGNOSIS — E559 Vitamin D deficiency, unspecified: Secondary | ICD-10-CM

## 2021-10-18 ENCOUNTER — Ambulatory Visit: Payer: Medicare HMO | Admitting: Nurse Practitioner

## 2021-10-20 ENCOUNTER — Other Ambulatory Visit: Payer: Self-pay | Admitting: Nurse Practitioner

## 2021-11-30 ENCOUNTER — Telehealth: Payer: Self-pay

## 2021-11-30 DIAGNOSIS — E785 Hyperlipidemia, unspecified: Secondary | ICD-10-CM

## 2021-11-30 MED ORDER — IRBESARTAN 75 MG PO TABS: 75.0000 mg | ORAL_TABLET | Freq: Every day | ORAL | 3 refills | Status: DC

## 2021-11-30 MED ORDER — SIMVASTATIN 10 MG PO TABS: 10.0000 mg | ORAL_TABLET | Freq: Every day | ORAL | 3 refills | Status: DC

## 2021-11-30 NOTE — Telephone Encounter (Signed)
Pharmacy send rx for Irbesartan 75 mg and Simvastatin 10 mg. Medications was send into pharmacy.

## 2022-01-04 NOTE — Progress Notes (Signed)
   This service is provided via telemedicine  No vital signs collected/recorded due to the encounter was a telemedicine visit.   Location of patient (ex: home, work):  Home  Patient consents to a telephone visit: Yes, see telephone visit dated 01/06/2022  Location of the provider (ex: office, home):  [employer], [place] Location   Name of any referring provider:  N/A  Names of all persons participating in the telemedicine service and their role in the encounter:  S.Chrae B/CMA, Abbey Chatters, NP, and Patient   Time spent on call:  12 min with medical assistant

## 2022-01-06 ENCOUNTER — Encounter: Payer: Medicare HMO | Admitting: Nurse Practitioner

## 2022-01-07 ENCOUNTER — Other Ambulatory Visit: Payer: Self-pay | Admitting: Nurse Practitioner

## 2022-01-07 DIAGNOSIS — E559 Vitamin D deficiency, unspecified: Secondary | ICD-10-CM

## 2022-01-11 NOTE — Progress Notes (Signed)
This encounter was created in error - please disregard.

## 2022-01-13 ENCOUNTER — Encounter: Payer: Medicare HMO | Admitting: Nurse Practitioner

## 2022-01-19 NOTE — Progress Notes (Unsigned)
   This service is provided via telemedicine  No vital signs collected/recorded due to the encounter was a telemedicine visit.   Location of patient (ex: home, work):  Home  Patient consents to a telephone visit: Yes, see telephone visit dated 01/20/2022   Location of the provider (ex: office, home):  [employer], [place] Location   Name of any referring provider:  N/A  Names of all persons participating in the telemedicine service and their role in the encounter:  S.Chrae B/CMA, Abbey Chatters, NP, and Patient   Time spent on call:  9 min with medical assistant

## 2022-01-20 ENCOUNTER — Ambulatory Visit (INDEPENDENT_AMBULATORY_CARE_PROVIDER_SITE_OTHER): Payer: Medicare HMO | Admitting: Nurse Practitioner

## 2022-01-20 ENCOUNTER — Encounter: Payer: Self-pay | Admitting: Nurse Practitioner

## 2022-01-20 ENCOUNTER — Telehealth: Payer: Self-pay

## 2022-01-20 DIAGNOSIS — Z0000 Encounter for general adult medical examination without abnormal findings: Secondary | ICD-10-CM | POA: Diagnosis not present

## 2022-01-20 NOTE — Telephone Encounter (Signed)
Mr. pharoah, goggins are scheduled for a virtual visit with your provider today.    Just as we do with appointments in the office, we must obtain your consent to participate.  Your consent will be active for this visit and any virtual visit you may have with one of our providers in the next 365 days.    If you have a MyChart account, I can also send a copy of this consent to you electronically.  All virtual visits are billed to your insurance company just like a traditional visit in the office.  As this is a virtual visit, video technology does not allow for your provider to perform a traditional examination.  This may limit your provider's ability to fully assess your condition.  If your provider identifies any concerns that need to be evaluated in person or the need to arrange testing such as labs, EKG, etc, we will make arrangements to do so.    Although advances in technology are sophisticated, we cannot ensure that it will always work on either your end or our end.  If the connection with a video visit is poor, we may have to switch to a telephone visit.  With either a video or telephone visit, we are not always able to ensure that we have a secure connection.   I need to obtain your verbal consent now.   Are you willing to proceed with your visit today?   Jairon Ripberger has provided verbal consent on 01/20/2022 for a virtual visit (video or telephone).   Edison Simon [place], [place] 01/20/2022  9:21 AM

## 2022-01-20 NOTE — Progress Notes (Signed)
Subjective:   Danny Winters is a 76 y.o. male who presents for Medicare Annual/Subsequent preventive examination.  Review of Systems     Cardiac Risk Factors include: advanced age (>82mn, >>18women);family history of premature cardiovascular disease;dyslipidemia;hypertension;diabetes mellitus;sedentary lifestyle     Objective:    Today's Vitals   01/20/22 0943  PainSc: 0-No pain   There is no height or weight on file to calculate BMI.     01/20/2022    9:15 AM 10/01/2021   12:54 PM 12/31/2020    9:17 AM 12/04/2020   10:02 AM 07/29/2020    9:52 AM 04/29/2020    8:30 AM 04/06/2020   12:41 PM  Advanced Directives  Does Patient Have a Medical Advance Directive? _0  No No  Does patient want to make changes to medical advance directive?      Yes (MAU/Ambulatory/Procedural Areas - Information given)   Would patient like information on creating a medical advance directive? Yes (MAU/Ambulatory/Procedural Areas - Information given) Yes (MAU/Ambulatory/Procedural Areas - Information given)   No - Patient declined  No - Patient declined    Current Medications (verified) Outpatient Encounter Medications as of 01/20/2022  Medication Sig   Accu-Chek Softclix Lancets lancets USE TO TEST BLOOD SUGAR TWICE DAILY.   Alcohol Swabs (DROPSAFE ALCOHOL PREP) 70 % PADS USE DAILY AS DIRECTED WHEN CHECKING BLOOD SUGAR   aspirin 81 MG tablet Take 81 mg by mouth daily.   diclofenac Sodium (VOLTAREN) 1 % GEL Apply 2 g topically 4 (four) times daily.   gabapentin (NEURONTIN) 100 MG capsule Take 1 capsule (100 mg total) by mouth at bedtime.   glucose blood (ACCU-CHEK AVIVA PLUS) test strip TEST BLOOD SUGAR TWICE DAILY E11.42   irbesartan (AVAPRO) 75 MG tablet Take 1 tablet (75 mg total) by mouth daily.   meclizine (ANTIVERT) 12.5 MG tablet Take 1-2 tablets (12.5-25 mg total) by mouth 3 (three) times daily as needed for dizziness.   metFORMIN (GLUCOPHAGE) 1000 MG tablet TAKE 1 TABLET TWICE DAILY FOR  BLOOD SUGAR   omeprazole (PRILOSEC) 20 MG capsule TAKE 1 CAPSULE EVERY DAY   simvastatin (ZOCOR) 10 MG tablet Take 1 tablet (10 mg total) by mouth daily at 6 PM.   Vitamin D, Ergocalciferol, (DRISDOL) 1.25 MG (50000 UNIT) CAPS capsule TAKE 1 CAPSULE BY MOUTH EVERY 7 DAYS FOR 12 WEEKS THEN SWITCH TO 2000 UNITS DAILY   [DISCONTINUED] Cholecalciferol (VITAMIN D3) 25 MCG (1000 UT) CAPS Take 1 capsule (1,000 Units total) by mouth daily. (Patient not taking: Reported on 01/20/2022)   No facility-administered encounter medications on file as of 01/20/2022.    Allergies (verified) Lisinopril   History: Past Medical History:  Diagnosis Date   GERD (gastroesophageal reflux disease)    H/O degenerative disc disease    Lumbago    Memory loss    Other and unspecified hyperlipidemia    Type II or unspecified type diabetes mellitus without mention of complication, uncontrolled    Unspecified arthropathy, shoulder region    Unspecified essential hypertension    Unspecified hereditary and idiopathic peripheral neuropathy    Unspecified vitamin D deficiency    Past Surgical History:  Procedure Laterality Date   COLONOSCOPY  2005   Dr JSharlotte AlamoMedoff   COLONOSCOPY W/ POLYPECTOMY  2012   [facility]- sessile polyp in transverse colon 4 mm and another 6 mm   INGUINAL HERNIA REPAIR  2019   KIDNEY STONE SURGERY  2013   Dr MKathie Rhodes  Family History  Problem Relation Age of Onset   Heart disease Mother    Heart attack Father    Cancer Sister        colon   Cancer Brother        colon   Cancer Brother        colon   Cancer Brother        prostate   Cancer Brother        prostate   Cancer Brother        Pancreas   Stroke Other    Kidney disease Nephew    Social History   Socioeconomic History   Marital status: Divorced    Spouse name: Not on file   Number of children: Not on file   Years of education: Not on file   Highest education level: Not on file  Occupational History    Not on file  Tobacco Use   Smoking status: Former    Packs/day: 1.00    Years: 11.00    Total pack years: 11.00    Types: Cigarettes    Quit date: 07/01/1973    Years since quitting: 48.5   Smokeless tobacco: Never  Vaping Use   Vaping Use: Never used  Substance and Sexual Activity   Alcohol use: Not Currently    Comment: none x 50 yrs   Drug use: No   Sexual activity: Not on file  Other Topics Concern   Not on file  Social History Narrative   Not on file   Social Determinants of Health   Financial Resource Strain: Low Risk  (12/18/2017)   Overall Financial Resource Strain (CARDIA)    Difficulty of Paying Living Expenses: Not hard at all  Food Insecurity: No Food Insecurity (12/18/2017)   Hunger Vital Sign    Worried About Running Out of Food in the Last Year: Never true    [place] in the Last Year: Never true  Transportation Needs: No Transportation Needs (12/18/2017)   PRAPARE - Hydrologist (Medical): No    Lack of Transportation (Non-Medical): No  Physical Activity: Inactive (12/18/2017)   Exercise Vital Sign    Days of Exercise per Week: 0 days    Minutes of Exercise per Session: 0 min  Stress: No Stress Concern Present (12/18/2017)   [place]    Feeling of Stress : Only a little  Social Connections: Moderately Integrated (12/18/2017)   Social Connection and Isolation Panel [NHANES]    Frequency of Communication with Friends and Family: More than three times a week    Frequency of Social Gatherings with Friends and Family: More than three times a week    Attends Religious Services: More than 4 times per year    Active Member of [employer] or Organizations: Yes    Attends Music therapist: More than 4 times per year    Marital Status: Divorced    Tobacco Counseling Counseling given: Not Answered   Clinical Intake:  Pre-visit preparation  completed: Yes  Pain : 0-10 Pain Score: 0-No pain Pain Type: Chronic pain Pain Location: Back Pain Orientation: Lower     BMI - recorded: 23  How often do you need to have someone help you when you read instructions, pamphlets, or other written materials from your doctor or pharmacy?: 1 - Never  Diabetic?no         Activities of Daily Living  01/20/2022    9:40 AM  In your present state of health, do you have any difficulty performing the following activities:  Hearing? 1  Vision? 0  Difficulty concentrating or making decisions? 1  Walking or climbing stairs? 0  Dressing or bathing? 0  Doing errands, shopping? 0  Preparing Food and eating ? N  Using the Toilet? N  In the past six months, have you accidently leaked urine? Y  Do you have problems with loss of bowel control? N  Managing your Medications? N  Managing your Finances? N  Housekeeping or managing your Housekeeping? N    Patient Care Team: Lauree Chandler, NP as PCP - General (Geriatric Medicine) Rutherford Guys, MD as Consulting Physician (Ophthalmology)  Indicate any recent Medical Services you may have received from other than [facility] providers in the past year (date may be approximate).     Assessment:   This is a routine wellness examination for Danny Winters.  Hearing/Vision screen Hearing Screening - Comments:: Patient with some decreased hearing and thinks he may benefit from seeing audiologist  Vision Screening - Comments:: Last eye exam greater than 12 months ago. Patient plans to schedule an eye appointment in the near future.   Dietary issues and exercise activities discussed: Current Exercise Habits: Home exercise routine, Type of exercise: walking, Time (Minutes): 20, Frequency (Times/Week): 5, Weekly Exercise (Minutes/Week): 100   Goals Addressed   None    Depression Screen    01/20/2022    9:13 AM 02/26/2021    8:49 AM 12/31/2020    9:15 AM 12/30/2019   10:03 AM 10/30/2019   10:09 AM  04/30/2019    9:22 AM 12/26/2018    9:41 AM  PHQ 2/9 Scores  PHQ - 2 Score 0 0 0 0 0 0 0    Fall Risk    01/20/2022    9:12 AM 02/26/2021    8:48 AM 12/31/2020    9:16 AM 12/04/2020   10:00 AM 04/29/2020    8:31 AM  Fall Risk   Falls in the past year? 1 0 1 1 0  Comment    Patient slipped on water   Number falls in past yr: 0 0 1 1 0  Injury with Fall? 0 0 0 0 0  Risk for fall due to : No Fall Risks No Fall Risks History of fall(s) Other (Comment)   Follow up Falls evaluation completed Falls evaluation completed Falls evaluation completed      [place]:  Any stairs in or around the home? No  If so, are there any without handrails?  na Home free of loose throw rugs in walkways, pet beds, electrical cords, etc? Yes  Adequate lighting in your home to reduce risk of falls? Yes   ASSISTIVE DEVICES UTILIZED TO PREVENT FALLS:  Life alert? No  Use of a cane, walker or w/c? No  Grab bars in the bathroom? No  Shower chair or bench in shower? No  Elevated toilet seat or a handicapped toilet? No   TIMED UP AND GO:  Was the test performed? No .    Cognitive Function:    12/18/2017   12:27 PM 08/12/2016    9:39 AM 07/17/2013    8:40 AM  MMSE - Mini Mental State Exam  Orientation to time _0 Orientation to Place _1 Registration _2 Attention/ Calculation _3 Recall 2 0 1  Language-  name 2 objects _0 Language- repeat _1 Language- follow 3 step command _2 Language- read & follow direction _3 Write a sentence _4 Copy design _5 Total score _6 01/20/2022    9:16 AM 12/31/2020    9:18 AM 12/30/2019   10:06 AM 12/26/2018    9:46 AM  6CIT Screen  What Year? 0 points 0 points 0 points 0 points  What month? 0 points 0 points 0 points 0 points  What time? 0 points 0 points 0 points 0 points  Count back from 20 0 points 0 points 0 points 0 points  Months in reverse 4 points 4 points 4 points 0  points  Repeat phrase 10 points 6 points 10 points 4 points  Total Score 14 points 10 points 14 points 4 points    Immunizations Immunization History  Administered Date(s) Administered   Fluad Quad(high Dose 65+) 09/21/2018, 10/30/2019, 12/04/2020   Influenza, High Dose Seasonal PF 12/18/2017   Influenza,inj,Quad PF,6+ Mos 11/13/2012, 11/20/2013, 01/23/2015, 11/27/2015, 04/18/2017   Influenza-Unspecified 11/10/2011   PFIZER(Purple Top)SARS-COV-2 Vaccination 08/20/2019, 09/10/2019   Pneumococcal Conjugate-13 04/08/2014   Pneumococcal Polysaccharide-23 10/01/2008, 11/13/2012   Tdap 01/24/2010    TDAP status: Up to date  Flu Vaccine status: Due, Education has been provided regarding the importance of this vaccine. Advised may receive this vaccine at local pharmacy or Health Dept. Aware to provide a copy of the vaccination record if obtained from local pharmacy or Health Dept. Verbalized acceptance and understanding.  Pneumococcal vaccine status: Up to date  Covid-19 vaccine status: Information provided on how to obtain vaccines.   Qualifies for Shingles Vaccine? Yes   Zostavax completed No   Shingrix Completed?: No.    Education has been provided regarding the importance of this vaccine. Patient has been advised to call insurance company to determine out of pocket expense if they have not yet received this vaccine. Advised may also receive vaccine at local pharmacy or Health Dept. Verbalized acceptance and understanding.  Screening Tests Health Maintenance  Topic Date Due   Zoster Vaccines- Shingrix (1 of 2) Never done   Diabetic kidney evaluation - Urine ACR  04/19/2018   DTaP/Tdap/Td (2 - Td or Tdap) 01/25/2020   HEMOGLOBIN A1C  06/01/2021   OPHTHALMOLOGY EXAM  06/23/2021   INFLUENZA VACCINE  08/24/2021   Diabetic kidney evaluation - eGFR measurement  12/02/2021   FOOT EXAM  02/26/2022   Medicare Annual Wellness (AWV)  01/21/2023   Pneumonia Vaccine 16+ Years old  Completed    HPV VACCINES  Aged Out   COLONOSCOPY (Pts 45-61yr Insurance coverage will need to be confirmed)  Discontinued   COVID-19 Vaccine  Discontinued   Hepatitis C Screening  Discontinued    Health Maintenance  Health Maintenance Due  Topic Date Due   Zoster Vaccines- Shingrix (1 of 2) Never done   Diabetic kidney evaluation - Urine ACR  04/19/2018   DTaP/Tdap/Td (2 - Td or Tdap) 01/25/2020   HEMOGLOBIN A1C  06/01/2021   OPHTHALMOLOGY EXAM  06/23/2021   INFLUENZA VACCINE  08/24/2021   Diabetic kidney evaluation - eGFR measurement  12/02/2021    Colorectal cancer screening: No longer required.   Lung Cancer Screening: (Low Dose CT Chest recommended if Age 77-80years, 30 pack-year currently smoking OR have quit w/in 15years.) does not qualify.   Lung Cancer Screening  Referral: na  Additional Screening:  Hepatitis C Screening: does qualify; Completed   Vision Screening: Recommended annual ophthalmology exams for early detection of glaucoma and other disorders of the eye. Is the patient up to date with their annual eye exam?  No  Who is the provider or what is the name of the office in which the patient attends annual eye exams? [facility] If pt is not established with a provider, would they like to be referred to a provider to establish care? No .   Dental Screening: Recommended annual dental exams for proper oral hygiene  Community Resource Referral / Chronic Care Management: CRR required this visit?  No   CCM required this visit?  No      Plan:     I have personally reviewed and noted the following in the patient's chart:   Medical and social history Use of alcohol, tobacco or illicit drugs  Current medications and supplements including opioid prescriptions. Patient is not currently taking opioid prescriptions. Functional ability and status Nutritional status Physical activity Advanced directives List of other physicians Hospitalizations, surgeries, and ER visits in  previous 12 months Vitals Screenings to include cognitive, depression, and falls Referrals and appointments  In addition, I have reviewed and discussed with patient certain preventive protocols, quality metrics, and best practice recommendations. A written personalized care plan for preventive services as well as general preventive health recommendations were provided to patient.     Lauree Chandler, NP   01/20/2022    Virtual Visit via Telephone Note  I connected with patient 01/20/22 at  9:20 AM EST by telephone and verified that I am speaking with the correct person using two identifiers.  Location: Patient: home Provider: [facility]    I discussed the limitations, risks, security and privacy concerns of performing an evaluation and management service by telephone and the availability of in person appointments. I also discussed with the patient that there may be a patient responsible charge related to this service. The patient expressed understanding and agreed to proceed.   I discussed the assessment and treatment plan with the patient. The patient was provided an opportunity to ask questions and all were answered. The patient agreed with the plan and demonstrated an understanding of the instructions.   The patient was advised to call back or seek an in-person evaluation if the symptoms worsen or if the condition fails to improve as anticipated.  I provided 15 minutes of non-face-to-face time during this encounter.  Carlos American. Harle Battiest Avs printed and mailed

## 2022-01-21 ENCOUNTER — Encounter: Payer: Self-pay | Admitting: Nurse Practitioner

## 2022-01-21 ENCOUNTER — Ambulatory Visit: Payer: Medicare HMO | Admitting: Nurse Practitioner

## 2022-01-21 VITALS — BP 122/74 | HR 75 | Temp 97.1°F | Ht 67.0 in | Wt 155.0 lb

## 2022-01-21 DIAGNOSIS — M159 Polyosteoarthritis, unspecified: Secondary | ICD-10-CM

## 2022-01-21 DIAGNOSIS — E538 Deficiency of other specified B group vitamins: Secondary | ICD-10-CM

## 2022-01-21 DIAGNOSIS — E785 Hyperlipidemia, unspecified: Secondary | ICD-10-CM | POA: Diagnosis not present

## 2022-01-21 DIAGNOSIS — E559 Vitamin D deficiency, unspecified: Secondary | ICD-10-CM

## 2022-01-21 DIAGNOSIS — Z23 Encounter for immunization: Secondary | ICD-10-CM | POA: Diagnosis not present

## 2022-01-21 DIAGNOSIS — E1142 Type 2 diabetes mellitus with diabetic polyneuropathy: Secondary | ICD-10-CM

## 2022-01-21 DIAGNOSIS — I10 Essential (primary) hypertension: Secondary | ICD-10-CM

## 2022-01-21 NOTE — Addendum Note (Signed)
Addended by: Maurice Small on: 01/21/2022 02:53 PM   Modules accepted: Orders

## 2022-01-21 NOTE — Progress Notes (Signed)
Careteam: Patient Care Team: Lauree Chandler, NP as PCP - General (Geriatric Medicine) Rutherford Guys, MD as Consulting Physician (Ophthalmology)  PLACE OF SERVICE:  [place] Directive information    Allergies  Allergen Reactions   Lisinopril Itching    Chief Complaint  Patient presents with   Medical Management of Chronic Issues    10 month follow-up. No recent labs to discuss. Care gaps were listed in AWV completed yesterday.      HPI: Patient is a 76 y.o. male for routine follow up.   DM- reports he has neuropathy on the bottome of his feet. Checks frequently.  Reports blood sugars are normal but did not bring log. States he does not have any low. No high highs.  Needs to make follow up for eye doctor.   4-5 months ago he was using glue at got it all over hands, they have been peeling since and very dry  Using VIt D once weekly   Daughter lives with him and makes sure he is doing the right thing.   He walks every day for exercise.  Review of Systems:  Review of Systems  Constitutional:  Negative for chills, fever and weight loss.  HENT:  Negative for tinnitus.   Respiratory:  Negative for cough, sputum production and shortness of breath.   Cardiovascular:  Negative for chest pain, palpitations and leg swelling.  Gastrointestinal:  Negative for abdominal pain, constipation, diarrhea and heartburn.  Genitourinary:  Negative for dysuria, frequency and urgency.  Musculoskeletal:  Negative for back pain, falls, joint pain and myalgias.  Skin: Negative.   Neurological:  Negative for dizziness and headaches.  Psychiatric/Behavioral:  Negative for depression and memory loss. The patient does not have insomnia.     Past Medical History:  Diagnosis Date   GERD (gastroesophageal reflux disease)    H/O degenerative disc disease    Lumbago    Memory loss    Other and unspecified hyperlipidemia    Type II or unspecified type diabetes mellitus without  mention of complication, uncontrolled    Unspecified arthropathy, shoulder region    Unspecified essential hypertension    Unspecified hereditary and idiopathic peripheral neuropathy    Unspecified vitamin D deficiency    Past Surgical History:  Procedure Laterality Date   COLONOSCOPY  2005   Dr Sharlotte Alamo Medoff   COLONOSCOPY W/ POLYPECTOMY  2012   [facility]- sessile polyp in transverse colon 4 mm and another 6 mm   INGUINAL HERNIA REPAIR  2019   KIDNEY STONE SURGERY  2013   Dr Kathie Rhodes   Social History:   reports that he quit smoking about 48 years ago. His smoking use included cigarettes. He has a 11.00 pack-year smoking history. He has never used smokeless tobacco. He reports that he does not currently use alcohol. He reports that he does not use drugs.  Family History  Problem Relation Age of Onset   Heart disease Mother    Heart attack Father    Cancer Sister        colon   Cancer Brother        colon   Cancer Brother        colon   Cancer Brother        prostate   Cancer Brother        prostate   Cancer Brother        Pancreas   Stroke Other    Kidney disease Nephew  Medications: Patient's Medications  New Prescriptions   No medications on file  Previous Medications   ACCU-CHEK SOFTCLIX LANCETS LANCETS    USE TO TEST BLOOD SUGAR TWICE DAILY.   ALCOHOL SWABS (DROPSAFE ALCOHOL PREP) 70 % PADS    USE DAILY AS DIRECTED WHEN CHECKING BLOOD SUGAR   ASPIRIN 81 MG TABLET    Take 81 mg by mouth daily.   DICLOFENAC SODIUM (VOLTAREN) 1 % GEL    Apply 2 g topically 4 (four) times daily.   GABAPENTIN (NEURONTIN) 100 MG CAPSULE    Take 1 capsule (100 mg total) by mouth at bedtime.   GLUCOSE BLOOD (ACCU-CHEK AVIVA PLUS) TEST STRIP    TEST BLOOD SUGAR TWICE DAILY E11.42   IRBESARTAN (AVAPRO) 75 MG TABLET    Take 1 tablet (75 mg total) by mouth daily.   MECLIZINE (ANTIVERT) 12.5 MG TABLET    Take 1-2 tablets (12.5-25 mg total) by mouth 3 (three) times daily as  needed for dizziness.   METFORMIN (GLUCOPHAGE) 1000 MG TABLET    TAKE 1 TABLET TWICE DAILY FOR BLOOD SUGAR   OMEPRAZOLE (PRILOSEC) 20 MG CAPSULE    TAKE 1 CAPSULE EVERY DAY   SIMVASTATIN (ZOCOR) 10 MG TABLET    Take 1 tablet (10 mg total) by mouth daily at 6 PM.   VITAMIN D, ERGOCALCIFEROL, (DRISDOL) 1.25 MG (50000 UNIT) CAPS CAPSULE    TAKE 1 CAPSULE BY MOUTH EVERY 7 DAYS FOR 12 WEEKS THEN SWITCH TO 2000 UNITS DAILY  Modified Medications   No medications on file  Discontinued Medications   No medications on file    Physical Exam:  Vitals:   01/20/22 1640  BP: 122/74  Pulse: 75  Temp: (!) 97.1 F (36.2 C)  TempSrc: Temporal  SpO2: 98%  Weight: 155 lb (70.3 kg)  Height: 5\' 7"  (1.702 m)   Body mass index is 24.28 kg/m. Wt Readings from Last 3 Encounters:  01/20/22 155 lb (70.3 kg)  10/01/21 151 lb (68.5 kg)  02/26/21 159 lb (72.1 kg)    Physical Exam Constitutional:      General: He is not in acute distress.    Appearance: He is well-developed. He is not diaphoretic.  HENT:     Head: Normocephalic and atraumatic.     Right Ear: External ear normal.     Left Ear: External ear normal.     Mouth/Throat:     Pharynx: No oropharyngeal exudate.  Eyes:     Conjunctiva/sclera: Conjunctivae normal.     Pupils: Pupils are equal, round, and reactive to light.  Cardiovascular:     Rate and Rhythm: Normal rate and regular rhythm.     Heart sounds: Normal heart sounds.  Pulmonary:     Effort: Pulmonary effort is normal.     Breath sounds: Normal breath sounds.  Abdominal:     General: Bowel sounds are normal.     Palpations: Abdomen is soft.  Musculoskeletal:        General: No tenderness.     Cervical back: Normal range of motion and neck supple.     Right lower leg: No edema.     Left lower leg: No edema.  Skin:    General: Skin is warm and dry.  Neurological:     Mental Status: He is alert and oriented to person, place, and time.     Labs reviewed: Basic  Metabolic Panel: No results for input(s): "NA", "K", "CL", "CO2", "GLUCOSE", "BUN", "CREATININE", "CALCIUM", "MG", "PHOS", "TSH"  in the last 8760 hours. Liver Function Tests: No results for input(s): "AST", "ALT", "ALKPHOS", "BILITOT", "PROT", "ALBUMIN" in the last 8760 hours. No results for input(s): "LIPASE", "AMYLASE" in the last 8760 hours. No results for input(s): "AMMONIA" in the last 8760 hours. CBC: No results for input(s): "WBC", "NEUTROABS", "HGB", "HCT", "MCV", "PLT" in the last 8760 hours. Lipid Panel: No results for input(s): "CHOL", "HDL", "LDLCALC", "TRIG", "CHOLHDL", "LDLDIRECT" in the last 8760 hours. TSH: No results for input(s): "TSH" in the last 8760 hours. A1C: Lab Results  Component Value Date   HGBA1C 7.0 (H) 12/02/2020     Assessment/Plan 1. DM type 2 with diabetic peripheral neuropathy (HCC) -Encouraged dietary compliance, routine foot care/monitoring and to keep up with diabetic eye exams through ophthalmology  -continue current medication with dietary modifications. - Hemoglobin A1c - Microalbumin/Creatinine Ratio, Urine  2. Essential hypertension Blood pressure well controlled, goal bp <140/90 Continue current medications and dietary modifications follow metabolic panel - COMPLETE METABOLIC PANEL WITH GFR - CBC with Differential/Platelet  3. Vitamin B12 deficiency -continues on supplement  4. Vitamin D deficiency -continues on supplement - Vitamin D, 25-hydroxy  5. Hyperlipidemia LDL goal <70 -continues on zocor with dietary modifications  - Lipid panel - COMPLETE METABOLIC PANEL WITH GFR  6. Need for influenza vaccination -flu shot given today  7. Primary osteoarthritis involving multiple joints -to avoid NSAIDS, tylenol 1-2 tablet every 8 hours as needed for pain.   8. Dry skin dermatitis.  -- Use a mild, unscented soap.  You can use what you like (Dove, Aveeno, etc). - Bathe every other day if possible.  - After the bath apply a  good lotion (unscented).   -recommendations include- Aveeno eczema, Aquafor, Eucerin. -make sure to stay hydrated.   Return in about 6 months (around 07/23/2022) for routine follow up . Janene Harvey. Biagio Borg  [facility] [phone]

## 2022-01-21 NOTE — Patient Instructions (Addendum)
-   Use a mild, unscented soap.  You can use what you like (Dove, Aveeno, etc). - After the bath apply a good lotion (unscented).   -recommendations include- Aveeno eczema, Aquafor, Eucerin. -make sure to stay hydrated,   To use aquafor healing ointment twice daily.    To call and make appt for eye doctor.  To get your shingles and TDAP vaccines at your local pharmacy   Encourage proper hydration and to avoid NSAIDS (Aleve, Advil, Motrin, Ibuprofen)  Okay to take tylenol 500 mg 1-2 tablets every 8 hours for pain

## 2022-01-22 LAB — MICROALBUMIN / CREATININE URINE RATIO
Creatinine, Urine: 116 mg/dL (ref 20–320)
Microalb Creat Ratio: 5 mcg/mg creat (ref <30)
Microalb, Ur: 0.6 mg/dL

## 2022-01-22 LAB — LIPID PANEL
Cholesterol: 115 mg/dL (ref <200)
HDL: 41 mg/dL (ref >=40)
LDL Cholesterol (Calc): 60 mg/dL (calc)
Non-HDL Cholesterol (Calc): 74 mg/dL (calc) (ref <130)
Total CHOL/HDL Ratio: 2.8 (calc) (ref <5.0)
Triglycerides: 49 mg/dL (ref <150)

## 2022-01-22 LAB — CBC WITH DIFFERENTIAL/PLATELET
Absolute Monocytes: 500 cells/uL (ref 200–950)
Basophils Absolute: 10 cells/uL (ref 0–200)
Basophils Relative: 0.2 %
Eosinophils Absolute: 78 cells/uL (ref 15–500)
Eosinophils Relative: 1.6 %
HCT: 40.6 % (ref 38.5–50.0)
Hemoglobin: 14 g/dL (ref 13.2–17.1)
Lymphs Abs: 1514 cells/uL (ref 850–3900)
MCH: 30 pg (ref 27.0–33.0)
MCHC: 34.5 g/dL (ref 32.0–36.0)
MCV: 86.9 fL (ref 80.0–100.0)
MPV: 10.2 fL (ref 7.5–12.5)
Monocytes Relative: 10.2 %
Neutro Abs: 2798 cells/uL (ref 1500–7800)
Neutrophils Relative %: 57.1 %
Platelets: 197 10*3/uL (ref 140–400)
RBC: 4.67 10*6/uL (ref 4.20–5.80)
RDW: 12.7 % (ref 11.0–15.0)
Total Lymphocyte: 30.9 %
WBC: 4.9 10*3/uL (ref 3.8–10.8)

## 2022-01-22 LAB — COMPLETE METABOLIC PANEL WITH GFR
AG Ratio: 1.5 (calc) (ref 1.0–2.5)
ALT: 13 U/L (ref 9–46)
AST: 21 U/L (ref 10–35)
Albumin: 4.3 g/dL (ref 3.6–5.1)
Alkaline phosphatase (APISO): 73 U/L (ref 35–144)
BUN: 18 mg/dL (ref 7–25)
CO2: 24 mmol/L (ref 20–32)
Calcium: 9.4 mg/dL (ref 8.6–10.3)
Chloride: 103 mmol/L (ref 98–110)
Creat: 1.12 mg/dL (ref 0.70–1.28)
Globulin: 2.8 g/dL (calc) (ref 1.9–3.7)
Glucose, Bld: 86 mg/dL (ref 65–99)
Potassium: 4.4 mmol/L (ref 3.5–5.3)
Sodium: 138 mmol/L (ref 135–146)
Total Bilirubin: 0.7 mg/dL (ref 0.2–1.2)
Total Protein: 7.1 g/dL (ref 6.1–8.1)
eGFR: 68 mL/min/{1.73_m2} (ref >=60)

## 2022-01-22 LAB — HEMOGLOBIN A1C
Hgb A1c MFr Bld: 7.1 % of total Hgb — ABNORMAL HIGH (ref <5.7)
Mean Plasma Glucose: 157 mg/dL
eAG (mmol/L): 8.7 mmol/L

## 2022-01-22 LAB — VITAMIN D 25 HYDROXY (VIT D DEFICIENCY, FRACTURES): Vit D, 25-Hydroxy: 27 ng/mL — ABNORMAL LOW (ref 30–100)

## 2022-01-25 ENCOUNTER — Other Ambulatory Visit: Payer: Self-pay

## 2022-01-25 DIAGNOSIS — E559 Vitamin D deficiency, unspecified: Secondary | ICD-10-CM

## 2022-01-25 MED ORDER — VITAMIN D (ERGOCALCIFEROL) 1.25 MG (50000 UNIT) PO CAPS: ORAL_CAPSULE | ORAL | 0 refills | Status: AC

## 2022-01-25 MED ORDER — VITAMIN D3 25 MCG (1000 UT) PO CAPS: 1000.0000 [IU] | ORAL_CAPSULE | Freq: Every day | ORAL | 3 refills | Status: DC

## 2022-02-25 ENCOUNTER — Other Ambulatory Visit: Payer: Self-pay | Admitting: Nurse Practitioner

## 2022-02-25 DIAGNOSIS — E1142 Type 2 diabetes mellitus with diabetic polyneuropathy: Secondary | ICD-10-CM

## 2022-04-22 ENCOUNTER — Other Ambulatory Visit: Payer: Self-pay | Admitting: Nurse Practitioner

## 2022-06-18 ENCOUNTER — Other Ambulatory Visit: Payer: Self-pay | Admitting: Nurse Practitioner

## 2022-06-18 DIAGNOSIS — E785 Hyperlipidemia, unspecified: Secondary | ICD-10-CM

## 2022-07-07 ENCOUNTER — Other Ambulatory Visit: Payer: Self-pay | Admitting: Nurse Practitioner

## 2022-07-11 ENCOUNTER — Encounter: Payer: Self-pay | Admitting: Nurse Practitioner

## 2022-07-11 ENCOUNTER — Encounter: Payer: Medicare HMO | Admitting: Nurse Practitioner

## 2022-07-12 NOTE — Progress Notes (Signed)
err

## 2022-07-25 ENCOUNTER — Encounter: Payer: Self-pay | Admitting: Nurse Practitioner

## 2022-07-25 ENCOUNTER — Encounter: Payer: Medicare HMO | Admitting: Nurse Practitioner

## 2022-07-25 NOTE — Progress Notes (Signed)
Err

## 2022-08-25 ENCOUNTER — Encounter: Payer: Self-pay | Admitting: Nurse Practitioner

## 2022-08-26 ENCOUNTER — Ambulatory Visit (INDEPENDENT_AMBULATORY_CARE_PROVIDER_SITE_OTHER): Payer: Medicare HMO | Admitting: Nurse Practitioner

## 2022-08-26 ENCOUNTER — Encounter: Payer: Self-pay | Admitting: Nurse Practitioner

## 2022-08-26 VITALS — BP 128/80 | HR 66 | Temp 97.3°F | Ht 67.0 in | Wt 147.0 lb

## 2022-08-26 DIAGNOSIS — E785 Hyperlipidemia, unspecified: Secondary | ICD-10-CM

## 2022-08-26 DIAGNOSIS — E559 Vitamin D deficiency, unspecified: Secondary | ICD-10-CM

## 2022-08-26 DIAGNOSIS — E1142 Type 2 diabetes mellitus with diabetic polyneuropathy: Secondary | ICD-10-CM | POA: Diagnosis not present

## 2022-08-26 DIAGNOSIS — I10 Essential (primary) hypertension: Secondary | ICD-10-CM

## 2022-08-26 DIAGNOSIS — M159 Polyosteoarthritis, unspecified: Secondary | ICD-10-CM | POA: Diagnosis not present

## 2022-08-26 DIAGNOSIS — M150 Primary generalized (osteo)arthritis: Secondary | ICD-10-CM

## 2022-08-26 DIAGNOSIS — E538 Deficiency of other specified B group vitamins: Secondary | ICD-10-CM | POA: Diagnosis not present

## 2022-08-26 LAB — CBC WITH DIFFERENTIAL/PLATELET
Absolute Monocytes: 418 cells/uL (ref 200–950)
Basophils Absolute: 22 cells/uL (ref 0–200)
Basophils Relative: 0.5 %
Eosinophils Absolute: 62 cells/uL (ref 15–500)
Eosinophils Relative: 1.4 %
HCT: 43.2 % (ref 38.5–50.0)
Hemoglobin: 14.5 g/dL (ref 13.2–17.1)
Lymphs Abs: 1496 cells/uL (ref 850–3900)
MCH: 29.7 pg (ref 27.0–33.0)
MCHC: 33.6 g/dL (ref 32.0–36.0)
MCV: 88.5 fL (ref 80.0–100.0)
MPV: 10.2 fL (ref 7.5–12.5)
Monocytes Relative: 9.5 %
Neutro Abs: 2402 cells/uL (ref 1500–7800)
Neutrophils Relative %: 54.6 %
Platelets: 208 10*3/uL (ref 140–400)
RBC: 4.88 10*6/uL (ref 4.20–5.80)
RDW: 12.8 % (ref 11.0–15.0)
Total Lymphocyte: 34 %
WBC: 4.4 10*3/uL (ref 3.8–10.8)

## 2022-08-26 MED ORDER — VITAMIN D3 25 MCG (1000 UT) PO CAPS
1000.0000 [IU] | ORAL_CAPSULE | Freq: Every day | ORAL | 3 refills | Status: DC
Start: 2022-08-26 — End: 2022-09-29

## 2022-08-26 MED ORDER — DICLOFENAC SODIUM 1 % EX GEL
2.0000 g | Freq: Four times a day (QID) | CUTANEOUS | 3 refills | Status: DC
Start: 2022-08-26 — End: 2022-09-29

## 2022-08-26 NOTE — Patient Instructions (Signed)
We will call you with ophthalmology referral You need diabetic eye exam
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
## 2022-09-18 ENCOUNTER — Other Ambulatory Visit: Payer: Self-pay | Admitting: Nurse Practitioner

## 2022-09-29 ENCOUNTER — Other Ambulatory Visit: Payer: Self-pay

## 2022-09-29 DIAGNOSIS — E785 Hyperlipidemia, unspecified: Secondary | ICD-10-CM

## 2022-09-29 DIAGNOSIS — E559 Vitamin D deficiency, unspecified: Secondary | ICD-10-CM

## 2022-09-29 DIAGNOSIS — M159 Polyosteoarthritis, unspecified: Secondary | ICD-10-CM

## 2022-09-29 MED ORDER — VITAMIN D3 25 MCG (1000 UT) PO CAPS
1000.0000 [IU] | ORAL_CAPSULE | Freq: Every day | ORAL | 3 refills | Status: AC
Start: 2022-09-29 — End: ?

## 2022-09-29 MED ORDER — DICLOFENAC SODIUM 1 % EX GEL: 2.0000 g | Freq: Four times a day (QID) | CUTANEOUS | 3 refills | Status: AC

## 2022-09-29 MED ORDER — SIMVASTATIN 10 MG PO TABS
10.0000 mg | ORAL_TABLET | Freq: Every day | ORAL | 1 refills | Status: DC
Start: 2022-09-29 — End: 2023-01-26

## 2022-09-29 NOTE — Telephone Encounter (Signed)
Refill request received from [facility] pharmacy

## 2022-10-07 ENCOUNTER — Ambulatory Visit
Admission: EM | Admit: 2022-10-07 | Discharge: 2022-10-07 | Disposition: A | Discharge status: Home / Self Care | Payer: Medicare HMO

## 2022-10-07 DIAGNOSIS — U071 COVID-19: Secondary | ICD-10-CM | POA: Diagnosis not present

## 2022-10-07 NOTE — ED Provider Notes (Addendum)
[facility]    CSN: [id number] Arrival date & time: 10/07/22  1059      History   Chief Complaint Chief Complaint  Patient presents with   COVID19    + Testing @ Home.    HPI Danny Winters is a 77 y.o. male.   Patient here today for evaluation of runny nose, cough occasional nausea that started about 5 days ago.  He reports this morning he took a COVID-19 screening test at home and it was positive.  He does not report fevers but did have some sweating.  He has taken over-the-counter medication with mild relief.  The history is provided by the patient.    Past Medical History:  Diagnosis Date   GERD (gastroesophageal reflux disease)    H/O degenerative disc disease    Lumbago    Memory loss    Other and unspecified hyperlipidemia    Type II or unspecified type diabetes mellitus without mention of complication, uncontrolled    Unspecified arthropathy, shoulder region    Unspecified essential hypertension    Unspecified hereditary and idiopathic peripheral neuropathy    Unspecified vitamin D deficiency     Patient Active Problem List   Diagnosis Date Noted   Gastroesophageal reflux disease without esophagitis 02/25/2021   Vitamin B12 deficiency 02/25/2021   Bilateral hearing loss 06/15/2020   Bilateral impacted cerumen 06/15/2020   Dizziness 06/15/2020   Essential hypertension 01/23/2015   Advanced care planning/counseling discussion 04/08/2014   Memory loss or impairment 11/20/2013   Mild cognitive impairment 07/17/2013   Smoking history 07/17/2013   Vitamin D deficiency 11/13/2012   Diabetic neuropathy (HCC) 11/13/2012   DM type 2 with diabetic peripheral neuropathy (HCC) 11/13/2012   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 Authorizing Provider  Accu-Chek Softclix Lancets lancets USE TO TEST BLOOD SUGAR TWICE DAILY. 09/19/22   Sharon Seller, NP  Alcohol Swabs (DROPSAFE ALCOHOL PREP) 70 % PADS USE DAILY AS DIRECTED WHEN CHECKING BLOOD SUGAR 09/19/22   Sharon Seller, NP  aspirin 81 MG tablet Take 81 mg by mouth daily.    [provider]  Cholecalciferol (VITAMIN D3) 25 MCG (1000 UT) CAPS Take 1 capsule (1,000 Units total) by mouth daily. 09/29/22   Sharon Seller, NP  diclofenac Sodium (VOLTAREN) 1 % GEL Apply 2 g topically 4 (four) times daily. 09/29/22   Sharon Seller, NP  gabapentin (NEURONTIN) 100 MG capsule Take 1 capsule (100 mg total) by mouth at bedtime. 04/18/17   Kirt Boys, DO  glucose blood (ACCU-CHEK AVIVA PLUS) test strip TEST BLOOD SUGAR TWICE DAILY E11.42 10/20/21   Sharon Seller, NP  irbesartan (AVAPRO) 75 MG tablet TAKE 1 TABLET BY MOUTH EVERY DAY 04/25/22   Sharon Seller, NP  meclizine (ANTIVERT) 12.5 MG tablet Take 1-2 tablets (12.5-25 mg total) by mouth 3 (three) times daily as needed for dizziness. 10/01/21   Medina-Vargas, Monina C, NP  metFORMIN (  GLUCOPHAGE) 1000 MG tablet TAKE 1 TABLET TWICE DAILY FOR BLOOD SUGAR 02/25/22   Sharon Seller, NP  omeprazole (PRILOSEC) 20 MG capsule TAKE 1 CAPSULE EVERY DAY 07/07/22   Sharon Seller, NP  simvastatin (ZOCOR) 10 MG tablet Take 1 tablet (10 mg total) by mouth daily at 6 PM. 09/29/22   Janyth Contes, Janene Harvey, NP    Family History Family History  Problem Relation Age of Onset   Heart disease Mother    Heart attack Father    Cancer Sister        colon   Cancer Brother        colon   Cancer Brother        colon   Cancer Brother        prostate   Cancer Brother         prostate   Cancer Brother        Pancreas   Stroke Other    Kidney disease Nephew     Social History Social History   Tobacco Use   Smoking status: Former    Current packs/day: 0.00    Average packs/day: 1 pack/day for 11.0 years (11.0 ttl pk-yrs)    Types: Cigarettes    Start date: 07/02/1962    Quit date: 07/01/1973    Years since quitting: 49.3   Smokeless tobacco: Never  Vaping Use   Vaping status: Never Used  Substance Use Topics   Alcohol use: Not Currently    Comment: none x 50 yrs   Drug use: No     Allergies   Lisinopril   Review of Systems Review of Systems  Constitutional:  Positive for chills and diaphoresis. Negative for fever.  HENT:  Positive for congestion. Negative for ear pain and sore throat.   Eyes:  Negative for discharge and redness.  Respiratory:  Positive for cough. Negative for shortness of breath.   Gastrointestinal:  Positive for nausea. Negative for abdominal pain, diarrhea and vomiting.     Physical Exam Triage Vital Signs ED Triage Vitals  Encounter Vitals Group     BP 10/07/22 1122 120/73     Systolic BP Percentile --      Diastolic BP Percentile --      Pulse Rate 10/07/22 1122 100     Resp 10/07/22 1122 18     Temp 10/07/22 1122 99.1 F (37.3 C)     Temp Source 10/07/22 1122 Oral     SpO2 10/07/22 1122 96 %     Weight 10/07/22 1120 147 lb 0.8 oz (66.7 kg)     Height 10/07/22 1120 5\' 7"  (1.702 m)     Head Circumference --      Peak Flow --      Pain Score 10/07/22 1118 0     Pain Loc --      Pain Education --      Exclude from Growth Chart --    No data found.  Updated Vital Signs BP 120/73 (BP Location: Left Arm)   Pulse 100   Temp 99.1 F (37.3 C) (Oral)   Resp 18   Ht 5\' 7"  (1.702 m)   Wt 147 lb 0.8 oz (66.7 kg)   SpO2 96%   BMI 23.03 kg/m      Physical Exam Vitals and nursing note reviewed.  Constitutional:      General: He is not in acute distress.    Appearance: Normal appearance. He is not  ill-appearing.  HENT:  Head: Normocephalic and atraumatic.     Nose: Congestion present.     Mouth/Throat:     Mouth: Mucous membranes are moist.     Pharynx: Oropharynx is clear. No oropharyngeal exudate or posterior oropharyngeal erythema.  Eyes:     Conjunctiva/sclera: Conjunctivae normal.  Cardiovascular:     Rate and Rhythm: Normal rate and regular rhythm.     Heart sounds: Normal heart sounds. No murmur heard. Pulmonary:     Effort: Pulmonary effort is normal. No respiratory distress.     Breath sounds: Normal breath sounds. No wheezing, rhonchi or rales.  Skin:    General: Skin is warm and dry.  Neurological:     Mental Status: He is alert.  Psychiatric:        Mood and Affect: Mood normal.        Thought Content: Thought content normal.      UC Treatments / Results  Labs (all labs ordered are listed, but only abnormal results are displayed) Labs Reviewed - No data to display  EKG   Radiology No results found.  Procedures Procedures (including critical care time)  Medications Ordered in UC Medications - No data to display  Initial Impression / Assessment and Plan / UC Course  I have reviewed the triage vital signs and the nursing notes.  Pertinent labs & imaging results that were available during my care of the patient were reviewed by me and considered in my medical decision making (see chart for details).    Discussed that given patient has had symptoms for 5 days no indication for antiviral therapy at this time.  Patient is stable does not seem significantly symptomatic.  Recommended over-the-counter treatment, increase fluids and rest with follow-up in the emergency room with any worsening.  Patient expresses understanding.  Final Clinical Impressions(s) / UC Diagnoses   Final diagnoses:  COVID-19   Discharge Instructions   None    ED Prescriptions   None    PDMP not reviewed this encounter.   Tomi Bamberger, PA-C 10/07/22 1459     Tomi Bamberger, PA-C 10/07/22 1625

## 2022-10-07 NOTE — ED Triage Notes (Signed)
@   home test for COVID19 + this morning. Started with "sweating, runny nose, couldn't sleep well, with occasional nausea". "Symptoms started Monday".

## 2022-11-01 ENCOUNTER — Other Ambulatory Visit: Payer: Self-pay | Admitting: Nurse Practitioner

## 2022-11-01 DIAGNOSIS — E559 Vitamin D deficiency, unspecified: Secondary | ICD-10-CM

## 2022-11-08 ENCOUNTER — Other Ambulatory Visit: Payer: Self-pay | Admitting: Nurse Practitioner

## 2022-11-27 ENCOUNTER — Encounter: Payer: Self-pay | Admitting: Nurse Practitioner

## 2023-01-16 ENCOUNTER — Ambulatory Visit (INDEPENDENT_AMBULATORY_CARE_PROVIDER_SITE_OTHER): Payer: Medicare HMO | Admitting: Nurse Practitioner

## 2023-01-16 ENCOUNTER — Encounter: Payer: Self-pay | Admitting: Nurse Practitioner

## 2023-01-16 VITALS — BP 118/68 | HR 79 | Temp 97.6°F | Resp 18 | Ht 67.0 in | Wt 149.3 lb

## 2023-01-16 DIAGNOSIS — I10 Essential (primary) hypertension: Secondary | ICD-10-CM | POA: Diagnosis not present

## 2023-01-16 DIAGNOSIS — R413 Other amnesia: Secondary | ICD-10-CM | POA: Diagnosis not present

## 2023-01-16 DIAGNOSIS — M1711 Unilateral primary osteoarthritis, right knee: Secondary | ICD-10-CM | POA: Diagnosis not present

## 2023-01-16 DIAGNOSIS — E785 Hyperlipidemia, unspecified: Secondary | ICD-10-CM

## 2023-01-16 DIAGNOSIS — E538 Deficiency of other specified B group vitamins: Secondary | ICD-10-CM | POA: Diagnosis not present

## 2023-01-16 DIAGNOSIS — E1142 Type 2 diabetes mellitus with diabetic polyneuropathy: Secondary | ICD-10-CM

## 2023-01-16 NOTE — Progress Notes (Signed)
Careteam: Patient Care Team: Sharon Seller, NP as PCP - General (Geriatric Medicine) Jethro Bolus, MD as Consulting Physician (Ophthalmology)  PLACE OF SERVICE:  [facility]  Advanced Directive information    Allergies  Allergen Reactions   Lisinopril Itching    Chief Complaint  Patient presents with   Acute Visit    Patient with memory issues and ongoing back pain      HPI: Patient is a 77 y.o. male for follow up.   Daughters concerns about memory- he reports it is in and out.  Daughter reports memory has gradually been worse.  He continues to eat 3 meals a day.  He baths routinely No trouble getting dressed.  Concerns about driving.  He picks his medication up but is daughter does his medication.   Has issues with back pain and knee pain He thinks it is arthritis. He is putting a cream on knee.  He continues to do things daily but the knee makes it hard for him to walk.   Review of Systems:  Review of Systems  Constitutional:  Negative for chills, fever and weight loss.  HENT:  Negative for tinnitus.   Respiratory:  Negative for cough, sputum production and shortness of breath.   Cardiovascular:  Negative for chest pain, palpitations and leg swelling.  Gastrointestinal:  Negative for abdominal pain, constipation, diarrhea and heartburn.  Genitourinary:  Negative for dysuria, frequency and urgency.  Musculoskeletal:  Positive for joint pain. Negative for back pain, falls and myalgias.  Skin: Negative.   Neurological:  Negative for dizziness and headaches.  Psychiatric/Behavioral:  Positive for memory loss. Negative for depression. The patient does not have insomnia.     Past Medical History:  Diagnosis Date   GERD (gastroesophageal reflux disease)    H/O degenerative disc disease    Lumbago    Memory loss    Other and unspecified hyperlipidemia    Type II or unspecified type diabetes mellitus without mention of complication, uncontrolled     Unspecified arthropathy, shoulder region    Unspecified essential hypertension    Unspecified hereditary and idiopathic peripheral neuropathy    Unspecified vitamin D deficiency    Past Surgical History:  Procedure Laterality Date   COLONOSCOPY  2005   Dr Worthy Flank Medoff   COLONOSCOPY W/ POLYPECTOMY  2012   [facility]- sessile polyp in transverse colon 4 mm and another 6 mm   INGUINAL HERNIA REPAIR  2019   KIDNEY STONE SURGERY  2013   Dr Ihor Gully   Social History:   reports that he quit smoking about 49 years ago. His smoking use included cigarettes. He started smoking about 60 years ago. He has a 11 pack-year smoking history. He has never used smokeless tobacco. He reports that he does not currently use alcohol. He reports that he does not use drugs.  Family History  Problem Relation Age of Onset   Heart disease Mother    Heart attack Father    Cancer Sister        colon   Cancer Brother        colon   Cancer Brother        colon   Cancer Brother        prostate   Cancer Brother        prostate   Cancer Brother        Pancreas   Stroke Other    Kidney disease Nephew     Medications: Patient's  Medications  New Prescriptions   No medications on file  Previous Medications   ACCU-CHEK SOFTCLIX LANCETS LANCETS    USE TO TEST BLOOD SUGAR TWICE DAILY.   ALCOHOL SWABS (DROPSAFE ALCOHOL PREP) 70 % PADS    USE DAILY AS DIRECTED WHEN CHECKING BLOOD SUGAR   ASPIRIN 81 MG TABLET    Take 81 mg by mouth daily.   CHOLECALCIFEROL (VITAMIN D3) 25 MCG (1000 UT) CAPS    Take 1 capsule (1,000 Units total) by mouth daily.   DICLOFENAC SODIUM (VOLTAREN) 1 % GEL    Apply 2 g topically 4 (four) times daily.   GABAPENTIN (NEURONTIN) 100 MG CAPSULE    Take 1 capsule (100 mg total) by mouth at bedtime.   GLUCOSE BLOOD (ACCU-CHEK AVIVA PLUS) TEST STRIP    TEST BLOOD SUGAR TWICE DAILY   IRBESARTAN (AVAPRO) 75 MG TABLET    TAKE 1 TABLET EVERY DAY   MECLIZINE (ANTIVERT) 12.5 MG TABLET     Take 1-2 tablets (12.5-25 mg total) by mouth 3 (three) times daily as needed for dizziness.   METFORMIN (GLUCOPHAGE) 1000 MG TABLET    TAKE 1 TABLET TWICE DAILY FOR BLOOD SUGAR   OMEPRAZOLE (PRILOSEC) 20 MG CAPSULE    TAKE 1 CAPSULE EVERY DAY   SIMVASTATIN (ZOCOR) 10 MG TABLET    Take 1 tablet (10 mg total) by mouth daily at 6 PM.  Modified Medications   No medications on file  Discontinued Medications   No medications on file    Physical Exam:  Vitals:   01/16/23 1245  BP: 118/68  Pulse: 79  Resp: 18  Temp: 97.6 F (36.4 C)  TempSrc: Temporal  SpO2: 97%  Weight: 149 lb 4.8 oz (67.7 kg)  Height: 5\' 7"  (1.702 m)   Body mass index is 23.38 kg/m. Wt Readings from Last 3 Encounters:  01/16/23 149 lb 4.8 oz (67.7 kg)  10/07/22 147 lb 0.8 oz (66.7 kg)  08/26/22 147 lb (66.7 kg)    Physical Exam Constitutional:      General: He is not in acute distress.    Appearance: He is well-developed. He is not diaphoretic.  HENT:     Head: Normocephalic and atraumatic.     Right Ear: External ear normal.     Left Ear: External ear normal.     Mouth/Throat:     Pharynx: No oropharyngeal exudate.  Eyes:     Conjunctiva/sclera: Conjunctivae normal.     Pupils: Pupils are equal, round, and reactive to light.  Cardiovascular:     Rate and Rhythm: Normal rate and regular rhythm.     Heart sounds: Normal heart sounds.  Pulmonary:     Effort: Pulmonary effort is normal.     Breath sounds: Normal breath sounds.  Abdominal:     General: Bowel sounds are normal.     Palpations: Abdomen is soft.  Musculoskeletal:     Cervical back: Normal range of motion and neck supple.     Lumbar back: Normal. Negative right straight leg raise test and negative left straight leg raise test.     Right hip: Decreased range of motion.     Right knee: Crepitus present. Normal range of motion. Tenderness present.     Right lower leg: No edema.     Left lower leg: No edema.  Skin:    General: Skin is warm  and dry.  Neurological:     Mental Status: He is alert and oriented to person, place, and  time.     Labs reviewed: Basic Metabolic Panel: Recent Labs    01/21/22 1447 08/26/22 1508  NA 138 137  K 4.4 5.3  CL 103 101  CO2 24 29  GLUCOSE 86 99  BUN 18 18  CREATININE 1.12 1.32*  CALCIUM 9.4 10.0   Liver Function Tests: Recent Labs    01/21/22 1447 08/26/22 1508  AST 21 22  ALT 13 14  BILITOT 0.7 0.8  PROT 7.1 7.5   No results for input(s): "LIPASE", "AMYLASE" in the last 8760 hours. No results for input(s): "AMMONIA" in the last 8760 hours. CBC: Recent Labs    01/21/22 1447 08/26/22 1508  WBC 4.9 4.4  NEUTROABS 2,798 2,402  HGB 14.0 14.5  HCT 40.6 43.2  MCV 86.9 88.5  PLT 197 208   Lipid Panel: Recent Labs    01/21/22 1447  CHOL 115  HDL 41  LDLCALC 60  TRIG 49  CHOLHDL 2.8   TSH: No results for input(s): "TSH" in the last 8760 hours. A1C: Lab Results  Component Value Date   HGBA1C 7.3 (H) 08/26/2022     Assessment/Plan 1. Memory loss (Primary) -discussed safety with memory loss -to remain active Health eating habits Denies anxiety or depression - Vitamin B12 - TSH - CBC with Differential/Platelet - Complete Metabolic Panel with eGFR - CT HEAD WO CONTRAST ( ); Future Consider adding aricept pending CT and labs   2. Primary osteoarthritis of right knee Can use tylenol 325 mg 2 tablets every 6 hours as needed for pain Can take every AM due to more pain in the am - Ambulatory referral to Physical Therapy - AMB referral to orthopedics for possible injection  3. DM type 2 with diabetic peripheral neuropathy (HCC) - Hemoglobin A1c -Encouraged dietary compliance, routine foot care/monitoring and to keep up with diabetic eye exams through ophthalmology   4. Essential hypertension -Blood pressure well controlled, goal bp <140/90 Continue current medications and dietary modifications follow metabolic pane  5. Hyperlipidemia LDL goal  <70 - Lipid panel  6. Vitamin B12 deficiency -to start on vit b12 1000 mcg daily in am    To keep follow up as scheduled.   Janene Harvey. [facility] [phone]

## 2023-01-16 NOTE — Patient Instructions (Signed)
Add tylenol to morning medication 325 mg 2 tablets every 6 hours as needed for pain.  Continue to use rub as needed  Physical therapy orders placed

## 2023-01-17 LAB — LIPID PANEL
Cholesterol: 113 mg/dL (ref <200)
HDL: 43 mg/dL (ref >=40)
LDL Cholesterol (Calc): 57 mg/dL
Non-HDL Cholesterol (Calc): 70 mg/dL (ref <130)
Total CHOL/HDL Ratio: 2.6 (calc) (ref <5.0)
Triglycerides: 54 mg/dL (ref <150)

## 2023-01-17 LAB — HEMOGLOBIN A1C
Hgb A1c MFr Bld: 7.3 %{Hb} — ABNORMAL HIGH (ref <5.7)
Mean Plasma Glucose: 163 mg/dL
eAG (mmol/L): 9 mmol/L

## 2023-01-17 LAB — CBC WITH DIFFERENTIAL/PLATELET
Absolute Lymphocytes: 1092 {cells}/uL (ref 850–3900)
Absolute Monocytes: 365 {cells}/uL (ref 200–950)
Basophils Absolute: 21 {cells}/uL (ref 0–200)
Basophils Relative: 0.5 %
Eosinophils Absolute: 71 {cells}/uL (ref 15–500)
Eosinophils Relative: 1.7 %
HCT: 43.3 % (ref 38.5–50.0)
Hemoglobin: 14.1 g/dL (ref 13.2–17.1)
MCH: 28.8 pg (ref 27.0–33.0)
MCHC: 32.6 g/dL (ref 32.0–36.0)
MCV: 88.5 fL (ref 80.0–100.0)
MPV: 9.9 fL (ref 7.5–12.5)
Monocytes Relative: 8.7 %
Neutro Abs: 2650 {cells}/uL (ref 1500–7800)
Neutrophils Relative %: 63.1 %
Platelets: 237 10*3/uL (ref 140–400)
RBC: 4.89 10*6/uL (ref 4.20–5.80)
RDW: 12.4 % (ref 11.0–15.0)
Total Lymphocyte: 26 %
WBC: 4.2 10*3/uL (ref 3.8–10.8)

## 2023-01-17 LAB — COMPLETE METABOLIC PANEL WITH GFR
AG Ratio: 1.3 (calc) (ref 1.0–2.5)
ALT: 13 U/L (ref 9–46)
AST: 21 U/L (ref 10–35)
Albumin: 4.3 g/dL (ref 3.6–5.1)
Alkaline phosphatase (APISO): 75 U/L (ref 35–144)
BUN: 14 mg/dL (ref 7–25)
CO2: 28 mmol/L (ref 20–32)
Calcium: 9.6 mg/dL (ref 8.6–10.3)
Chloride: 102 mmol/L (ref 98–110)
Creat: 1.11 mg/dL (ref 0.70–1.28)
Globulin: 3.3 g/dL (ref 1.9–3.7)
Glucose, Bld: 128 mg/dL (ref 65–139)
Potassium: 4.7 mmol/L (ref 3.5–5.3)
Sodium: 137 mmol/L (ref 135–146)
Total Bilirubin: 0.8 mg/dL (ref 0.2–1.2)
Total Protein: 7.6 g/dL (ref 6.1–8.1)
eGFR: 68 mL/min/{1.73_m2} (ref >=60)

## 2023-01-17 LAB — VITAMIN B12: Vitamin B-12: 305 pg/mL (ref 200–1100)

## 2023-01-17 LAB — TSH: TSH: 1.03 m[IU]/L (ref 0.40–4.50)

## 2023-01-20 ENCOUNTER — Other Ambulatory Visit: Payer: Self-pay

## 2023-01-26 ENCOUNTER — Other Ambulatory Visit: Payer: Self-pay | Admitting: Nurse Practitioner

## 2023-01-26 DIAGNOSIS — E785 Hyperlipidemia, unspecified: Secondary | ICD-10-CM

## 2023-01-31 ENCOUNTER — Encounter: Payer: Medicare HMO | Admitting: Physician Assistant

## 2023-02-02 ENCOUNTER — Ambulatory Visit: Payer: Medicare HMO | Admitting: Nurse Practitioner

## 2023-02-02 ENCOUNTER — Encounter: Payer: Self-pay | Admitting: Nurse Practitioner

## 2023-02-02 DIAGNOSIS — E1142 Type 2 diabetes mellitus with diabetic polyneuropathy: Secondary | ICD-10-CM | POA: Diagnosis not present

## 2023-02-02 DIAGNOSIS — Z0000 Encounter for general adult medical examination without abnormal findings: Secondary | ICD-10-CM

## 2023-02-02 NOTE — Patient Instructions (Signed)
  [name] , Thank you for taking time to come for your Medicare Wellness Visit. I appreciate your ongoing commitment to your health goals. Please review the following plan we discussed and let me know if I can assist you in the future.     To get SHINGLES VACCINE SERIES, TDAP and COVID booster at local pharmacy  To get flu shot at pharmacy or office.   This is a list of the screening recommended for you and due dates:  Health Maintenance  Topic Date Due   Zoster (Shingles) Vaccine (1 of 2) Never done   DTaP/Tdap/Td vaccine (2 - Td or Tdap) 01/25/2020   Eye exam for diabetics  06/23/2021   Complete foot exam   02/26/2022   Flu Shot  08/25/2022   Yearly kidney health urinalysis for diabetes  01/22/2023   Hemoglobin A1C  07/17/2023   Yearly kidney function blood test for diabetes  01/16/2024   Medicare Annual Wellness Visit  02/02/2024   Pneumonia Vaccine  Completed   HPV Vaccine  Aged Out   Colon Cancer Screening  Discontinued   COVID-19 Vaccine  Discontinued   Hepatitis C Screening  Discontinued

## 2023-02-02 NOTE — Progress Notes (Signed)
 Subjective:   Danny Winters is a 78 y.o. male who presents for Medicare Annual/Subsequent preventive examination.  Visit Complete: Virtual I connected with  Danny Winters on 02/02/23 by a video and audio enabled telemedicine application and verified that I am speaking with the correct person using two identifiers.  Patient Location: Home  Provider Location: Office/Clinic  I discussed the limitations of evaluation and management by telemedicine. The patient expressed understanding and agreed to proceed.  Vital Signs: Because this visit was a virtual/telehealth visit, some criteria may be missing or patient reported. Any vitals not documented were not able to be obtained and vitals that have been documented are patient reported.    Cardiac Risk Factors include: advanced age (>69men, >52 women);diabetes mellitus;family history of premature cardiovascular disease;dyslipidemia;hypertension;male gender;sedentary lifestyle     Objective:    There were no vitals filed for this visit. There is no height or weight on file to calculate BMI.     08/26/2022    3:02 PM 01/20/2022    9:15 AM 10/01/2021   12:54 PM 12/31/2020    9:17 AM 12/04/2020   10:02 AM 07/29/2020    9:52 AM 04/29/2020    8:30 AM  Advanced Directives  Does Patient Have a Medical Advance Directive? Yes No No No No No No  Type of Estate Agent of [place];Living will        Does patient want to make changes to medical advance directive? No - Patient declined      Yes (MAU/Ambulatory/Procedural Areas - Information given)  Copy of Healthcare Power of Attorney in Chart? No - copy requested        Would patient like information on creating a medical advance directive?  Yes (MAU/Ambulatory/Procedural Areas - Information given) Yes (MAU/Ambulatory/Procedural Areas - Information given)   No - Patient declined     Current Medications (verified) Outpatient Encounter Medications as of 02/02/2023  Medication Sig    Accu-Chek Softclix Lancets lancets USE TO TEST BLOOD SUGAR TWICE DAILY.   Alcohol Swabs (DROPSAFE ALCOHOL PREP) 70 % PADS USE DAILY AS DIRECTED WHEN CHECKING BLOOD SUGAR   aspirin 81 MG tablet Take 81 mg by mouth daily.   Cholecalciferol (VITAMIN D3) 25 MCG (1000 UT) CAPS Take 1 capsule (1,000 Units total) by mouth daily.   Cyanocobalamin  (VITAMIN B-12 PO) Take 1 tablet by mouth daily.   diclofenac  Sodium (VOLTAREN ) 1 % GEL Apply 2 g topically 4 (four) times daily.   gabapentin  (NEURONTIN ) 100 MG capsule Take 1 capsule (100 mg total) by mouth at bedtime.   glucose blood (ACCU-CHEK AVIVA PLUS) test strip TEST BLOOD SUGAR TWICE DAILY   irbesartan  (AVAPRO ) 75 MG tablet TAKE 1 TABLET EVERY DAY   meclizine  (ANTIVERT ) 12.5 MG tablet Take 1-2 tablets (12.5-25 mg total) by mouth 3 (three) times daily as needed for dizziness.   metFORMIN  (GLUCOPHAGE ) 1000 MG tablet TAKE 1 TABLET TWICE DAILY FOR BLOOD SUGAR   omeprazole  (PRILOSEC) 20 MG capsule TAKE 1 CAPSULE EVERY DAY   simvastatin  (ZOCOR ) 10 MG tablet TAKE 1 TABLET (10 MG TOTAL) BY MOUTH DAILY AT 6 PM.   No facility-administered encounter medications on file as of 02/02/2023.    Allergies (verified) Lisinopril   History: Past Medical History:  Diagnosis Date   GERD (gastroesophageal reflux disease)    H/O degenerative disc disease    Lumbago    Memory loss    Other and unspecified hyperlipidemia    Type II or unspecified type diabetes mellitus without  mention of complication, uncontrolled    Unspecified arthropathy, shoulder region    Unspecified essential hypertension    Unspecified hereditary and idiopathic peripheral neuropathy    Unspecified vitamin D  deficiency    Past Surgical History:  Procedure Laterality Date   COLONOSCOPY  2005   Dr Nila Medoff   COLONOSCOPY W/ POLYPECTOMY  2012   [facility]- sessile polyp in transverse colon 4 mm and another 6 mm   INGUINAL HERNIA REPAIR  2019   KIDNEY STONE SURGERY  2013   Dr Oneil Rafter   Family History  Problem Relation Age of Onset   Heart disease Mother    Heart attack Father    Cancer Sister        colon   Cancer Brother        colon   Cancer Brother        colon   Cancer Brother        prostate   Cancer Brother        prostate   Cancer Brother        Pancreas   Stroke Other    Kidney disease Nephew    Social History   Socioeconomic History   Marital status: Divorced    Spouse name: Not on file   Number of children: Not on file   Years of education: Not on file   Highest education level: Not on file  Occupational History   Not on file  Tobacco Use   Smoking status: Former    Current packs/day: 0.00    Average packs/day: 1 pack/day for 11.0 years (11.0 ttl pk-yrs)    Types: Cigarettes    Start date: 07/02/1962    Quit date: 07/01/1973    Years since quitting: 49.6   Smokeless tobacco: Never  Vaping Use   Vaping status: Never Used  Substance and Sexual Activity   Alcohol use: Not Currently    Comment: none x 50 yrs   Drug use: No   Sexual activity: Not Currently  Other Topics Concern   Not on file  Social History Narrative   Not on file   Social Drivers of Health   Financial Resource Strain: Low Risk  (12/18/2017)   Overall Financial Resource Strain (CARDIA)    Difficulty of Paying Living Expenses: Not hard at all  Food Insecurity: No Food Insecurity (12/18/2017)   Hunger Vital Sign    Worried About Running Out of Food in the Last Year: Never true    Ran Out of Food in the Last Year: Never true  Transportation Needs: No Transportation Needs (12/18/2017)   PRAPARE - Administrator, Civil Service (Medical): No    Lack of Transportation (Non-Medical): No  Physical Activity: Inactive (12/18/2017)   Exercise Vital Sign    Days of Exercise per Week: 0 days    Minutes of Exercise per Session: 0 min  Stress: No Stress Concern Present (12/18/2017)   [employer] of Occupational Health - Occupational Stress  Questionnaire    Feeling of Stress : Only a little  Social Connections: Moderately Integrated (12/18/2017)   Social Connection and Isolation Panel [NHANES]    Frequency of Communication with Friends and Family: More than three times a week    Frequency of Social Gatherings with Friends and Family: More than three times a week    Attends Religious Services: More than 4 times per year    Active Member of Clubs or Organizations: Yes    Attends  Engineer, Structural: More than 4 times per year    Marital Status: Divorced    Tobacco Counseling Counseling given: Not Answered   Clinical Intake:  Pre-visit preparation completed: Yes  Pain : No/denies pain     BMI - recorded: 23 Nutritional Status: BMI of 19-24  Normal Diabetes: Yes  How often do you need to have someone help you when you read instructions, pamphlets, or other written materials from your doctor or pharmacy?: 3 - Sometimes         Activities of Daily Living    02/02/2023    9:09 AM  In your present state of health, do you have any difficulty performing the following activities:  Hearing? 0  Vision? 1  Difficulty concentrating or making decisions? 1  Walking or climbing stairs? 1  Comment due to knee pain has trouble walking  Dressing or bathing? 0  Doing errands, shopping? 0  Preparing Food and eating ? N  Using the Toilet? N  In the past six months, have you accidently leaked urine? Y  Do you have problems with loss of bowel control? N  Managing your Medications? N  Managing your Finances? Y  Comment daughter helps  Housekeeping or managing your Housekeeping? Y  Comment daughter helps    Patient Care Team: Caro Harlene POUR, NP as PCP - General (Geriatric Medicine) Roz Anes, MD as Consulting Physician (Ophthalmology)  Indicate any recent Medical Services you may have received from other than [facility] providers in the past year (date may be approximate).     Assessment:   This is a  routine wellness examination for Ahan.  Hearing/Vision screen Vision Screening - Comments:: [facility] Last Eye Exam 2023   Goals Addressed   None    Depression Screen    02/02/2023    8:49 AM 01/16/2023   12:45 PM 08/26/2022    3:02 PM 01/20/2022    9:13 AM 02/26/2021    8:49 AM 12/31/2020    9:15 AM 12/30/2019   10:03 AM  PHQ 2/9 Scores  PHQ - 2 Score 0 0 0 0 0 0 0    Fall Risk    02/02/2023    8:49 AM 01/16/2023   12:45 PM 08/26/2022    3:02 PM 01/20/2022    9:12 AM 02/26/2021    8:48 AM  Fall Risk   Falls in the past year? 0 0 0 1 0  Number falls in past yr: 0 0 0 0 0  Injury with Fall? 0 0 0 0 0  Risk for fall due to :  No Fall Risks No Fall Risks No Fall Risks No Fall Risks  Follow up  Follow up appointment Falls evaluation completed Falls evaluation completed Falls evaluation completed    MEDICARE RISK AT HOME: Medicare Risk at Home Any stairs in or around the home?: No Home free of loose throw rugs in walkways, pet beds, electrical cords, etc?: No Adequate lighting in your home to reduce risk of falls?: Yes Life alert?: No Use of a cane, walker or w/c?: No Grab bars in the bathroom?: No Shower chair or bench in shower?: No Elevated toilet seat or a handicapped toilet?: No  TIMED UP AND GO:  Was the test performed?  No    Cognitive Function:    01/16/2023    1:09 PM 12/18/2017   12:27 PM 08/12/2016    9:39 AM 07/17/2013    8:40 AM  MMSE - Mini Mental State Exam  Orientation to time 3 4 5 5   Orientation to Place 5 4 5 5   Registration 3 3 3 3   Attention/ Calculation 0 5 5 4   Recall 0 2 0 1  Language- name 2 objects 2 2 2 2   Language- repeat 1 1 1 1   Language- follow 3 step command 3 3 2 3   Language- read & follow direction 1 1 1 1   Write a sentence 1 1 1 1   Copy design 1 1 1 1   Total score 20 27 26 27         02/02/2023    8:50 AM 01/20/2022    9:16 AM 12/31/2020    9:18 AM 12/30/2019   10:06 AM 12/26/2018    9:46 AM  6CIT Screen  What  Year? 0 points 0 points 0 points 0 points 0 points  What month? 0 points 0 points 0 points 0 points 0 points  What time? 0 points 0 points 0 points 0 points 0 points  Count back from 20 0 points 0 points 0 points 0 points 0 points  Months in reverse 2 points 4 points 4 points 4 points 0 points  Repeat phrase 8 points 10 points 6 points 10 points 4 points  Total Score 10 points 14 points 10 points 14 points 4 points    Immunizations Immunization History  Administered Date(s) Administered   Fluad Quad(high Dose 65+) 09/21/2018, 10/30/2019, 12/04/2020, 01/21/2022   Influenza, High Dose Seasonal PF 12/18/2017   Influenza,inj,Quad PF,6+ Mos 11/13/2012, 11/20/2013, 01/23/2015, 11/27/2015, 04/18/2017   Influenza-Unspecified 11/10/2011   PFIZER(Purple Top)SARS-COV-2 Vaccination 08/20/2019, 09/10/2019   Pneumococcal Conjugate-13 04/08/2014   Pneumococcal Polysaccharide-23 10/01/2008, 11/13/2012   Tdap 01/24/2010    TDAP status: Due, Education has been provided regarding the importance of this vaccine. Advised may receive this vaccine at local pharmacy or Health Dept. Aware to provide a copy of the vaccination record if obtained from local pharmacy or Health Dept. Verbalized acceptance and understanding.  Flu Vaccine status: Due, Education has been provided regarding the importance of this vaccine. Advised may receive this vaccine at local pharmacy or Health Dept. Aware to provide a copy of the vaccination record if obtained from local pharmacy or Health Dept. Verbalized acceptance and understanding.  Pneumococcal vaccine status: Up to date  Covid-19 vaccine status: Information provided on how to obtain vaccines.   Qualifies for Shingles Vaccine? Yes   Zostavax completed No   Shingrix Completed?: No.    Education has been provided regarding the importance of this vaccine. Patient has been advised to call insurance company to determine out of pocket expense if they have not yet received this  vaccine. Advised may also receive vaccine at local pharmacy or Health Dept. Verbalized acceptance and understanding.  Screening Tests Health Maintenance  Topic Date Due   Zoster Vaccines- Shingrix (1 of 2) Never done   DTaP/Tdap/Td (2 - Td or Tdap) 01/25/2020   OPHTHALMOLOGY EXAM  06/23/2021   FOOT EXAM  02/26/2022   INFLUENZA VACCINE  08/25/2022   Diabetic kidney evaluation - Urine ACR  01/22/2023   HEMOGLOBIN A1C  07/17/2023   Diabetic kidney evaluation - eGFR measurement  01/16/2024   Medicare Annual Wellness (AWV)  02/02/2024   Pneumonia Vaccine 22+ Years old  Completed   HPV VACCINES  Aged Out   Colonoscopy  Discontinued   COVID-19 Vaccine  Discontinued   Hepatitis C Screening  Discontinued    Health Maintenance  Health Maintenance Due  Topic Date Due  Zoster Vaccines- Shingrix (1 of 2) Never done   DTaP/Tdap/Td (2 - Td or Tdap) 01/25/2020   OPHTHALMOLOGY EXAM  06/23/2021   FOOT EXAM  02/26/2022   INFLUENZA VACCINE  08/25/2022   Diabetic kidney evaluation - Urine ACR  01/22/2023    Colorectal cancer screening: No longer required.   Lung Cancer Screening: (Low Dose CT Chest recommended if Age 73-80 years, 20 pack-year currently smoking OR have quit w/in 15years.) does not qualify.   Lung Cancer Screening Referral: na  Additional Screening:  Hepatitis C Screening: does qualify; Completed  Vision Screening: Recommended annual ophthalmology exams for early detection of glaucoma and other disorders of the eye. Is the patient up to date with their annual eye exam?  No  Who is the provider or what is the name of the office in which the patient attends annual eye exams? Does not have If pt is not established with a provider, would they like to be referred to a provider to establish care? Yes .   Dental Screening: Recommended annual dental exams for proper oral hygiene  Diabetic Foot Exam: Diabetic Foot Exam: Overdue, Pt has been advised about the importance in  completing this exam. Pt is scheduled for diabetic foot exam on 02/27/2023. / Community Resource Referral / Chronic Care Management: CRR required this visit?  No   CCM required this visit?  No     Plan:     I have personally reviewed and noted the following in the patient's chart:   Medical and social history Use of alcohol, tobacco or illicit drugs  Current medications and supplements including opioid prescriptions. Patient is not currently taking opioid prescriptions. Functional ability and status Nutritional status Physical activity Advanced directives List of other physicians Hospitalizations, surgeries, and ER visits in previous 12 months Vitals Screenings to include cognitive, depression, and falls Referrals and appointments  In addition, I have reviewed and discussed with patient certain preventive protocols, quality metrics, and best practice recommendations. A written personalized care plan for preventive services as well as general preventive health recommendations were provided to patient.     Harlene MARLA An, NP   02/02/2023   After Visit Summary: (MyChart) Due to this being a telephonic visit, the after visit summary with patients personalized plan was offered to patient via MyChart

## 2023-02-02 NOTE — Progress Notes (Signed)
  This service is provided via telemedicine  No vital signs collected/recorded due to the encounter was a telemedicine visit.   Location of patient (ex: home, work):  Home  Patient consents to a telephone visit:  Yes  Location of the provider (ex: office, home):  Office [place].   Name of any referring provider:  na  Names of all persons participating in the telemedicine service and their role in the encounter:  [name], Patient, Donzell Beal, CMA, Harlene An, NP  Time spent on call:  7:11

## 2023-02-13 ENCOUNTER — Other Ambulatory Visit: Payer: Self-pay | Admitting: Nurse Practitioner

## 2023-02-27 ENCOUNTER — Encounter: Payer: Medicare HMO | Admitting: Nurse Practitioner

## 2023-02-27 NOTE — Progress Notes (Signed)
 This encounter was created in error - please disregard.

## 2023-03-27 DIAGNOSIS — H5203 Hypermetropia, bilateral: Secondary | ICD-10-CM | POA: Diagnosis not present

## 2023-03-27 DIAGNOSIS — H2510 Age-related nuclear cataract, unspecified eye: Secondary | ICD-10-CM | POA: Diagnosis not present

## 2023-04-13 ENCOUNTER — Other Ambulatory Visit: Payer: Self-pay | Admitting: Nurse Practitioner

## 2023-04-13 DIAGNOSIS — E1142 Type 2 diabetes mellitus with diabetic polyneuropathy: Secondary | ICD-10-CM

## 2023-05-09 ENCOUNTER — Encounter: Payer: Self-pay | Admitting: Nurse Practitioner

## 2023-05-15 ENCOUNTER — Telehealth: Payer: Self-pay | Admitting: *Deleted

## 2023-05-15 ENCOUNTER — Encounter: Payer: Self-pay | Admitting: Nurse Practitioner

## 2023-05-15 NOTE — Telephone Encounter (Signed)
 Tanya Fantasia with [employer] called and stated that patient is scheduled for a CT Scan tomorrow but they do not have a Prior Auth.   Tanya Fantasia would like for someone to call her with the Prior Auth. [phone]  Forwarded to Referral Team.

## 2023-05-16 ENCOUNTER — Other Ambulatory Visit

## 2023-05-16 NOTE — Telephone Encounter (Signed)
 Patients daughter was connected with me via [employer] agent and inquired about why authorization was not done for CT and indicated as a result, appointment for today was canceled.  I am re-routing this message to the referral coordinator and I have also heightened her awareness of this call by sending a secure chat message

## 2023-05-16 NOTE — Telephone Encounter (Signed)
 Patient's daughter, Mariah Shines, returned my call. I advised her of the insurance approval for the  CT HEAD WO and that [facility] ([facility] called him at 2:41 pm yesterday to reschedule.  I informed Mariah Shines that the approval is valid to 07/14/23. Mariah Shines said she will call [facility] back and reschedule the exam.

## 2023-05-16 NOTE — Telephone Encounter (Signed)
 Patient returned my call. Patient aware of approval and [facility]'s attempt to reach him yesterday. Patient said they are calling [facility] to reschedule.

## 2023-05-19 ENCOUNTER — Other Ambulatory Visit

## 2023-06-22 ENCOUNTER — Other Ambulatory Visit

## 2023-06-30 ENCOUNTER — Ambulatory Visit
Admission: RE | Admit: 2023-06-30 | Discharge: 2023-06-30 | Disposition: A | Discharge status: Home / Self Care | Source: Ambulatory Visit | Attending: Nurse Practitioner | Admitting: Nurse Practitioner

## 2023-06-30 DIAGNOSIS — G319 Degenerative disease of nervous system, unspecified: Secondary | ICD-10-CM | POA: Diagnosis not present

## 2023-06-30 DIAGNOSIS — I6782 Cerebral ischemia: Secondary | ICD-10-CM | POA: Diagnosis not present

## 2023-06-30 DIAGNOSIS — R413 Other amnesia: Secondary | ICD-10-CM

## 2023-07-04 ENCOUNTER — Ambulatory Visit: Payer: Self-pay | Admitting: Nurse Practitioner

## 2023-07-10 ENCOUNTER — Other Ambulatory Visit: Payer: Self-pay | Admitting: Nurse Practitioner

## 2023-08-24 NOTE — Patient Instructions (Signed)
 1.) Visit your local pharmacy to receive your shingles and tetanus vaccine.

## 2023-08-25 ENCOUNTER — Encounter: Admitting: Nurse Practitioner

## 2023-08-25 NOTE — Progress Notes (Signed)
 This encounter was created in error - please disregard.

## 2023-08-30 ENCOUNTER — Other Ambulatory Visit: Payer: Self-pay | Admitting: Nurse Practitioner

## 2023-08-30 DIAGNOSIS — E785 Hyperlipidemia, unspecified: Secondary | ICD-10-CM

## 2023-10-02 ENCOUNTER — Encounter: Payer: Self-pay | Admitting: Nurse Practitioner

## 2023-10-02 ENCOUNTER — Ambulatory Visit (INDEPENDENT_AMBULATORY_CARE_PROVIDER_SITE_OTHER): Admitting: Nurse Practitioner

## 2023-10-02 VITALS — BP 118/72 | Temp 97.1°F | Ht 67.0 in | Wt 145.2 lb

## 2023-10-02 DIAGNOSIS — Z1212 Encounter for screening for malignant neoplasm of rectum: Secondary | ICD-10-CM

## 2023-10-02 DIAGNOSIS — Z1211 Encounter for screening for malignant neoplasm of colon: Secondary | ICD-10-CM

## 2023-10-02 DIAGNOSIS — E785 Hyperlipidemia, unspecified: Secondary | ICD-10-CM

## 2023-10-02 DIAGNOSIS — F01A0 Vascular dementia, mild, without behavioral disturbance, psychotic disturbance, mood disturbance, and anxiety: Secondary | ICD-10-CM | POA: Diagnosis not present

## 2023-10-02 DIAGNOSIS — E1142 Type 2 diabetes mellitus with diabetic polyneuropathy: Secondary | ICD-10-CM

## 2023-10-02 DIAGNOSIS — Z23 Encounter for immunization: Secondary | ICD-10-CM

## 2023-10-02 DIAGNOSIS — M5030 Other cervical disc degeneration, unspecified cervical region: Secondary | ICD-10-CM | POA: Diagnosis not present

## 2023-10-02 DIAGNOSIS — M51362 Other intervertebral disc degeneration, lumbar region with discogenic back pain and lower extremity pain: Secondary | ICD-10-CM | POA: Diagnosis not present

## 2023-10-02 MED ORDER — DONEPEZIL HCL 5 MG PO TABS: 5.0000 mg | ORAL_TABLET | Freq: Every day | ORAL | 0 refills | Status: DC

## 2023-10-02 NOTE — Assessment & Plan Note (Signed)
 Currently on zocor .  Follow up lipids

## 2023-10-02 NOTE — Patient Instructions (Signed)
 Start aricept  5 mg by mouth daily at bedtime, take for 4 weeks and then increase to 10 mg daily at bedtime

## 2023-10-02 NOTE — Assessment & Plan Note (Signed)
 Progressive low back pain, will get PT to eval and treat

## 2023-10-02 NOTE — Progress Notes (Signed)
 Careteam: Patient Care Team: Caro Harlene POUR, NP as PCP - General (Geriatric Medicine) Roz Anes, MD as Consulting Physician (Ophthalmology)  PLACE OF SERVICE:  [facility]  Advanced Directive information    Allergies  Allergen Reactions   Lisinopril Itching    Chief Complaint  Patient presents with   Results    Follow-up on CT Scan results. Discuss if ok to get flu vaccine (patient with cough), A1c, foot exam and diabetic kidney evaluation today. Discuss need for gabapentin . Discuss need for meclizine , as dizziness, related to vision.    Cough    Ongoing cough (dry)  and stuffiness    Neck Pain    Ongoing.     HPI:  Discussed the use of AI scribe software for clinical note transcription with the patient, who gave verbal consent to proceed.  History of Present Illness Danny Winters is a 78 year old male with diabetes and degenerative disc disease who presents for follow-up and discussion of a head CT scan. He is accompanied by his granddaughter.  A CT scan performed in June showed mild to moderate chronic small vessel ischemic changes within the cerebral white matter and moderate generalized cerebral atrophy. He has been experiencing memory loss, slower reaction times, and difficulty with clear thoughts.  His diabetes is currently borderline controlled. He is due for blood work to check his A1c, kidney function, liver function, and cholesterol levels. His cholesterol was well controlled as of December 2024.  He has a significant family history of cancer, which raises concerns about the need for a colonoscopy. He was last screened in 2012 and did not schedule follow up.   He experiences ongoing neck and lower back pain, attributed to degenerative disc disease, with numbness and tingling in both legs, particularly around the knees and ankles. He describes the pain as a 'shooting pain' and uses Bengay for relief. He has a history of seeing an orthopedic specialist and  has previously benefited from physical therapy.  His social history [place] which he continues to do despite his health challenges. 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    Review of Systems:  Review of Systems  Constitutional:  Negative for chills, fever and weight loss.  HENT:  Negative for tinnitus.   Respiratory:  Negative for cough, sputum production and shortness of breath.   Cardiovascular:  Negative for chest pain, palpitations and leg swelling.  Gastrointestinal:  Negative for abdominal pain, constipation, diarrhea and heartburn.  Genitourinary:  Negative for dysuria, frequency and urgency.  Musculoskeletal:  Positive for back pain and neck pain. Negative for falls, joint pain and myalgias.  Skin: Negative.   Neurological:  Negative for dizziness and headaches.  Psychiatric/Behavioral:  Positive for memory loss. Negative for depression. The patient has insomnia.     Past Medical History:  Diagnosis Date   GERD (gastroesophageal reflux disease)    H/O degenerative disc disease    Lumbago    Memory loss    Other and unspecified hyperlipidemia    Type II or unspecified type diabetes mellitus without mention of complication, uncontrolled    Unspecified arthropathy, shoulder region    Unspecified essential hypertension    Unspecified hereditary and idiopathic peripheral neuropathy    Unspecified vitamin D  deficiency    Past Surgical History:  Procedure Laterality Date   COLONOSCOPY  2005   Dr  Jefferey Medoff   COLONOSCOPY W/ POLYPECTOMY  2012   [facility]- sessile polyp in transverse colon 4 mm and another 6 mm   INGUINAL HERNIA REPAIR  2019   KIDNEY STONE SURGERY  2013   Dr Mark Ottelin   Social History:   reports that he quit smoking about 50 years ago. His smoking use included cigarettes. He started smoking about 61 years  ago. He has a 11 pack-year smoking history. He has never used smokeless tobacco. He reports that he does not currently use alcohol. He reports that he does not use drugs.  Family History  Problem Relation Age of Onset   Heart disease Mother    Heart attack Father    Cancer Sister        colon   Cancer Brother        colon   Cancer Brother        colon   Cancer Brother        prostate   Cancer Brother        prostate   Cancer Brother        Pancreas   Stroke Other    Kidney disease Nephew     Medications: Patient's Medications  New Prescriptions   No medications on file  Previous Medications   ACCU-CHEK SOFTCLIX LANCETS LANCETS    USE TO TEST BLOOD SUGAR TWICE DAILY.   ALCOHOL SWABS (DROPSAFE ALCOHOL PREP) 70 % PADS    USE DAILY AS DIRECTED WHEN CHECKING BLOOD SUGAR   ASPIRIN 81 MG TABLET    Take 81 mg by mouth daily.   CHOLECALCIFEROL (VITAMIN D3) 25 MCG (1000 UT) CAPS    Take 1 capsule (1,000 Units total) by mouth daily.   CYANOCOBALAMIN  (VITAMIN B-12 PO)    Take 1 tablet by mouth daily.   DICLOFENAC  SODIUM (VOLTAREN ) 1 % GEL    Apply 2 g topically 4 (four) times daily.   GABAPENTIN  (NEURONTIN ) 100 MG CAPSULE    Take 1 capsule (100 mg total) by mouth at bedtime.   GLUCOSE BLOOD (ACCU-CHEK AVIVA PLUS) TEST STRIP    TEST BLOOD SUGAR TWICE DAILY   IRBESARTAN  (AVAPRO ) 75 MG TABLET    TAKE 1 TABLET EVERY DAY   MECLIZINE  (ANTIVERT ) 12.5 MG TABLET    Take 1-2 tablets (12.5-25 mg total) by mouth 3 (three) times daily as needed for dizziness.   METFORMIN  (GLUCOPHAGE ) 1000 MG TABLET    TAKE 1 TABLET TWICE DAILY FOR BLOOD SUGAR   OMEPRAZOLE  (PRILOSEC) 20 MG CAPSULE    TAKE 1 CAPSULE EVERY DAY   SIMVASTATIN  (ZOCOR ) 10 MG TABLET    TAKE 1 TABLET (10 MG TOTAL) BY MOUTH DAILY AT 6 PM.  Modified Medications   No medications on file  Discontinued Medications   No medications on file    Physical Exam:  Vitals:   10/02/23 1144  BP: 118/72  Temp: (!) 97.1 F (36.2 C)  TempSrc:  Temporal  Weight: 145 lb 3.2 oz (65.9 kg)  Height: 5' 7 (1.702 m)   Body mass index is 22.74 kg/m. Wt Readings from Last 3 Encounters:  10/02/23 145 lb 3.2 oz (65.9 kg)  01/16/23 149 lb 4.8 oz (67.7 kg)  10/07/22 147 lb 0.8 oz (66.7 kg)    Physical Exam Constitutional:      General: He is not in acute distress.    Appearance: He is well-developed. He is not diaphoretic.  HENT:     Head: Normocephalic and atraumatic.  Right Ear: External ear normal.     Left Ear: External ear normal.     Mouth/Throat:     Pharynx: No oropharyngeal exudate.  Eyes:     Conjunctiva/sclera: Conjunctivae normal.     Pupils: Pupils are equal, round, and reactive to light.  Cardiovascular:     Rate and Rhythm: Normal rate and regular rhythm.     Heart sounds: Normal heart sounds.  Pulmonary:     Effort: Pulmonary effort is normal.     Breath sounds: Normal breath sounds.  Abdominal:     General: Bowel sounds are normal.     Palpations: Abdomen is soft.  Musculoskeletal:        General: No tenderness.     Cervical back: Normal range of motion and neck supple.     Right lower leg: No edema.     Left lower leg: No edema.  Skin:    General: Skin is warm and dry.  Neurological:     Mental Status: He is alert and oriented to person, place, and time.     Labs reviewed: Basic Metabolic Panel: Recent Labs    01/16/23 1338  NA 137  K 4.7  CL 102  CO2 28  GLUCOSE 128  BUN 14  CREATININE 1.11  CALCIUM  9.6  TSH 1.03   Liver Function Tests: Recent Labs    01/16/23 1338  AST 21  ALT 13  BILITOT 0.8  PROT 7.6   No results for input(s): LIPASE, AMYLASE in the last 8760 hours. No results for input(s): AMMONIA in the last 8760 hours. CBC: Recent Labs    01/16/23 1338  WBC 4.2  NEUTROABS 2,650  HGB 14.1  HCT 43.3  MCV 88.5  PLT 237   Lipid Panel: Recent Labs    01/16/23 1338  CHOL 113  HDL 43  LDLCALC 57  TRIG 54  CHOLHDL 2.6   TSH: Recent Labs     01/16/23 1338  TSH 1.03   A1C: Lab Results  Component Value Date   HGBA1C 7.3 (H) 01/16/2023     Assessment/Plan  DM type 2 with diabetic peripheral neuropathy (HCC) Assessment & Plan: Continue medication and dietary compliance, routine foot care/monitoring and to keep up with diabetic eye exams through ophthalmology   Orders: -     Microalbumin / creatinine urine ratio -     Hemoglobin A1c -     Comprehensive metabolic panel with GFR -     CBC with Differential/Platelet -     Lipid panel  Immunization due -     Flu vaccine HIGH DOSE PF(Fluzone Trivalent)  Hyperlipidemia LDL goal <70 Assessment & Plan: Currently on zocor .  Follow up lipids  Orders: -     Comprehensive metabolic panel with GFR -     Lipid panel  Encounter for colorectal cancer screening -     Ambulatory referral to Gastroenterology  Mild vascular dementia without behavioral disturbance, psychotic disturbance, mood disturbance, or anxiety (HCC) Assessment & Plan: Noted to have progressive memory loss, no acute changes in cognitive or functional status, supportive care by family Will start aricept  to preserve memory 5 mg by mouth daily at bedtime for 1 month then increase to 10 mg daily if able to tolerate without side effects Continue to stay active Healthy diet encouraged Also can add namenda at follow up once tolerating aricept .   Orders: -     Donepezil  HCl; Take 1 tablet (5 mg total) by mouth at bedtime.  Dispense:  60 tablet; Refill: 0  DDD (degenerative disc disease), cervical Assessment & Plan: Increase in pain, will get PT to eval and treat  Orders: -     Ambulatory referral to Physical Therapy  Degeneration of intervertebral disc of lumbar region with discogenic back pain and lower extremity pain Assessment & Plan: Progressive low back pain, will get PT to eval and treat  Orders: -     Ambulatory referral to Physical Therapy   Return in about 4 months (around 02/01/2024) for  routine follow up.  Dezi Brauner K. Caro BODILY [facility] [phone]

## 2023-10-02 NOTE — Assessment & Plan Note (Signed)
 Continue medication and dietary compliance, routine foot care/monitoring and to keep up with diabetic eye exams through ophthalmology

## 2023-10-02 NOTE — Assessment & Plan Note (Addendum)
 Noted to have progressive memory loss, no acute changes in cognitive or functional status, supportive care by family Will start aricept  to preserve memory 5 mg by mouth daily at bedtime for 1 month then increase to 10 mg daily if able to tolerate without side effects Continue to stay active Healthy diet encouraged Also can add namenda at follow up once tolerating aricept .

## 2023-10-02 NOTE — Assessment & Plan Note (Signed)
 Increase in pain, will get PT to eval and treat

## 2023-10-03 ENCOUNTER — Ambulatory Visit: Payer: Self-pay | Admitting: Nurse Practitioner

## 2023-10-03 LAB — CBC WITH DIFFERENTIAL/PLATELET
Absolute Lymphocytes: 1067 {cells}/uL (ref 850–3900)
Absolute Monocytes: 320 {cells}/uL (ref 200–950)
Basophils Absolute: 32 {cells}/uL (ref 0–200)
Basophils Relative: 0.7 %
Eosinophils Absolute: 50 {cells}/uL (ref 15–500)
Eosinophils Relative: 1.1 %
HCT: 41.9 % (ref 38.5–50.0)
Hemoglobin: 14.1 g/dL (ref 13.2–17.1)
MCH: 30.2 pg (ref 27.0–33.0)
MCHC: 33.7 g/dL (ref 32.0–36.0)
MCV: 89.7 fL (ref 80.0–100.0)
MPV: 9.6 fL (ref 7.5–12.5)
Monocytes Relative: 7.1 %
Neutro Abs: 3033 {cells}/uL (ref 1500–7800)
Neutrophils Relative %: 67.4 %
Platelets: 210 10*3/uL (ref 140–400)
RBC: 4.67 Million/uL (ref 4.20–5.80)
RDW: 13 % (ref 11.0–15.0)
Total Lymphocyte: 23.7 %
WBC: 4.5 10*3/uL (ref 3.8–10.8)

## 2023-10-03 LAB — COMPREHENSIVE METABOLIC PANEL WITH GFR
AG Ratio: 1.4 (calc) (ref 1.0–2.5)
ALT: 11 U/L (ref 9–46)
AST: 19 U/L (ref 10–35)
Albumin: 4.3 g/dL (ref 3.6–5.1)
Alkaline phosphatase (APISO): 61 U/L (ref 35–144)
BUN: 12 mg/dL (ref 7–25)
CO2: 30 mmol/L (ref 20–32)
Calcium: 9.6 mg/dL (ref 8.6–10.3)
Chloride: 101 mmol/L (ref 98–110)
Creat: 0.96 mg/dL (ref 0.70–1.28)
Globulin: 3 g/dL (ref 1.9–3.7)
Glucose, Bld: 127 mg/dL (ref 65–139)
Potassium: 4.3 mmol/L (ref 3.5–5.3)
Sodium: 137 mmol/L (ref 135–146)
Total Bilirubin: 0.8 mg/dL (ref 0.2–1.2)
Total Protein: 7.3 g/dL (ref 6.1–8.1)
eGFR: 81 mL/min/{1.73_m2}

## 2023-10-03 LAB — LIPID PANEL
Cholesterol: 110 mg/dL (ref <200)
HDL: 43 mg/dL (ref >=40)
LDL Cholesterol (Calc): 52 mg/dL
Non-HDL Cholesterol (Calc): 67 mg/dL (ref <130)
Total CHOL/HDL Ratio: 2.6 (calc) (ref <5.0)
Triglycerides: 72 mg/dL (ref <150)

## 2023-10-03 LAB — HEMOGLOBIN A1C
Hgb A1c MFr Bld: 7 % — ABNORMAL HIGH (ref <5.7)
Mean Plasma Glucose: 154 mg/dL
eAG (mmol/L): 8.5 mmol/L

## 2023-10-03 LAB — MICROALBUMIN / CREATININE URINE RATIO
Creatinine, Urine: 121 mg/dL (ref 20–320)
Microalb, Ur: <0.2 mg/dL

## 2023-10-24 NOTE — Progress Notes (Signed)
 Jerred Zaremba                                          MRN: [id number]   10/24/2023   The [facility] Quality Team Specialist reviewed this patient medical record for the purposes of chart review for care gap closure. The following were reviewed: abstraction for care gap closure-care for older adult medication review, controlling blood pressure, and kidney health evaluation for diabetes:eGFR  and uACR.    [facility] Quality Team

## 2023-11-01 ENCOUNTER — Ambulatory Visit

## 2023-11-01 ENCOUNTER — Ambulatory Visit: Attending: Nurse Practitioner

## 2023-11-01 NOTE — Therapy (Deleted)
 OUTPATIENT PHYSICAL THERAPY THORACOLUMBAR EVALUATION   Patient Name: Danny Winters MRN: [id number] DOB:1945/04/19, 78 y.o., male Today's Date: 11/01/2023  END OF SESSION:   Past Medical History:  Diagnosis Date   GERD (gastroesophageal reflux disease)    H/O degenerative disc disease    Lumbago    Memory loss    Other and unspecified hyperlipidemia    Type II or unspecified type diabetes mellitus without mention of complication, uncontrolled    Unspecified arthropathy, shoulder region    Unspecified essential hypertension    Unspecified hereditary and idiopathic peripheral neuropathy    Unspecified vitamin D  deficiency    Past Surgical History:  Procedure Laterality Date   COLONOSCOPY  2005   Dr Nila Medoff   COLONOSCOPY W/ POLYPECTOMY  2012   [facility]- sessile polyp in transverse colon 4 mm and another 6 mm   INGUINAL HERNIA REPAIR  2019   KIDNEY STONE SURGERY  2013   Dr Oneil Rafter   Patient Active Problem List   Diagnosis Date Noted   Mild vascular dementia without behavioral disturbance, psychotic disturbance, mood disturbance, or anxiety (HCC) 10/02/2023   DDD (degenerative disc disease), cervical 10/02/2023   Degeneration of intervertebral disc of lumbar region with discogenic back pain and lower extremity pain 10/02/2023   Gastroesophageal reflux disease without esophagitis 02/25/2021   Vitamin B12 deficiency 02/25/2021   Bilateral hearing loss 06/15/2020   Bilateral impacted cerumen 06/15/2020   Dizziness 06/15/2020   Essential hypertension 01/23/2015   Advanced care planning/counseling discussion 04/08/2014   Memory loss or impairment 11/20/2013   Mild cognitive impairment 07/17/2013   Smoking history 07/17/2013   Vitamin D  deficiency 11/13/2012   Diabetic neuropathy (HCC) 11/13/2012   DM type 2 with diabetic peripheral neuropathy (HCC) 11/13/2012   Benign hypertensive heart disease without heart failure 11/13/2012   Hyperlipidemia LDL goal  <70 11/13/2012    PCP: Caro Raisin NP  REFERRING PROVIDER: same  REFERRING DIAG: cervical and lumbar DDD  Rationale for Evaluation and Treatment: Rehabilitation  THERAPY DIAG:  No diagnosis found.  ONSET DATE: ***  SUBJECTIVE:                                                                                                                                                                                           SUBJECTIVE STATEMENT: ***  PERTINENT HISTORY:  ***  PAIN:  Are you having pain? {OPRCPAIN:27236}  PRECAUTIONS: {Therapy precautions:24002}  RED FLAGS: {PT Red Flags:29287}   WEIGHT BEARING RESTRICTIONS: {Yes ***/No:24003}  FALLS:  Has patient fallen in last 6 months? {fallsyesno:27318}  LIVING ENVIRONMENT: Lives with: {OPRC lives with:25569::lives with their family} Lives  in: {Lives in:25570} Stairs: {opstairs:27293} Has following equipment at home: {Assistive devices:23999}  OCCUPATION: ***  PLOF: {PLOF:24004}  PATIENT GOALS: ***  NEXT MD VISIT: January 2026  OBJECTIVE:  Note: Objective measures were completed at Evaluation unless otherwise noted.  DIAGNOSTIC FINDINGS:  X rays from 2022 lumbar and cervical spines demonstrate degenerative disc disease multilevel cervical mild, lumbar more advanced L5/S1  PATIENT SURVEYS:  Modified oswestry:   COGNITION: Overall cognitive status: {cognition:24006}     SENSATION: {sensation:27233}  MUSCLE LENGTH: Hamstrings: Right *** deg; Left *** deg Debby test: Right *** deg; Left *** deg  POSTURE:   PALPATION: ***  LUMBAR ROM:   AROM eval  Flexion   Extension   Right lateral flexion   Left lateral flexion   Right rotation   Left rotation    (Blank rows = not tested)  LOWER EXTREMITY ROM:     {AROM/PROM:27142}  Right eval Left eval  Hip flexion    Hip extension    Hip abduction    Hip adduction    Hip internal rotation    Hip external rotation    Knee flexion    Knee  extension    Ankle dorsiflexion    Ankle plantarflexion    Ankle inversion    Ankle eversion     (Blank rows = not tested)  LOWER EXTREMITY MMT:    MMT Right eval Left eval  Hip flexion    Hip extension    Hip abduction    Hip adduction    Hip internal rotation    Hip external rotation    Knee flexion    Knee extension    Ankle dorsiflexion    Ankle plantarflexion    Ankle inversion    Ankle eversion     (Blank rows = not tested)  LUMBAR SPECIAL TESTS:  {lumbar special test:25242}  FUNCTIONAL TESTS:  {Functional tests:24029}  GAIT: Distance walked: *** Assistive device utilized: {Assistive devices:23999} Level of assistance: {Levels of assistance:24026} Comments: ***  TREATMENT DATE: ***                                                                                                                                 PATIENT EDUCATION:  Education details: POC, goals Person educated: Patient Education method: Programmer, multimedia, Facilities manager, Actor cues, Verbal cues, and Handouts Education comprehension: verbalized understanding, returned demonstration, verbal cues required, tactile cues required, and needs further education  HOME EXERCISE PROGRAM: ***  ASSESSMENT:  CLINICAL IMPRESSION: Patient is a 78 y.o. male who was evaluated today by physical therapy  for chronic lumbar and cervical spine pain with radicular Sx.   OBJECTIVE IMPAIRMENTS: {opptimpairments:25111}.   ACTIVITY LIMITATIONS: {activitylimitations:27494}  PARTICIPATION LIMITATIONS: {participationrestrictions:25113}  PERSONAL FACTORS: {Personal factors:25162} are also affecting patient's functional outcome.   REHAB POTENTIAL: Good  CLINICAL DECISION MAKING: Evolving/moderate complexity  EVALUATION COMPLEXITY: Moderate   GOALS: Goals reviewed with patient? Yes  SHORT TERM GOALS: Target date: 2 weeks,  1022/25  I HEP Baseline: Goal status: INITIAL  LONG TERM GOALS: Target date:  ***  Modified oswestry Baseline:  Goal status: INITIAL  2.  TUG Baseline:  Goal status: INITIAL  3.  30 sec sit to stand Baseline:  Goal status: INITIAL  4.  *** Baseline:  Goal status: INITIAL  5.  *** Baseline:  Goal status: INITIAL  6.  *** Baseline:  Goal status: INITIAL  PLAN:  PT FREQUENCY: {rehab frequency:25116}  PT DURATION: {rehab duration:25117}  PLANNED INTERVENTIONS: {rehab planned interventions:25118::97110-Therapeutic exercises,97530- Therapeutic [id number]- Neuromuscular re-education,97535- Self Rjmz,02859- Manual therapy,Patient/Family education}.  PLAN FOR NEXT SESSION: ***   Greig LITTIE Credit, PT 11/01/2023, 12:45 PM

## 2023-11-17 ENCOUNTER — Other Ambulatory Visit: Payer: Self-pay | Admitting: Nurse Practitioner

## 2023-11-17 DIAGNOSIS — E785 Hyperlipidemia, unspecified: Secondary | ICD-10-CM

## 2023-11-18 ENCOUNTER — Other Ambulatory Visit: Payer: Self-pay | Admitting: Nurse Practitioner

## 2023-11-18 DIAGNOSIS — F01A0 Vascular dementia, mild, without behavioral disturbance, psychotic disturbance, mood disturbance, and anxiety: Secondary | ICD-10-CM

## 2023-11-27 ENCOUNTER — Encounter: Payer: Self-pay | Admitting: Radiology

## 2023-12-03 ENCOUNTER — Other Ambulatory Visit: Payer: Self-pay | Admitting: Nurse Practitioner

## 2024-01-29 ENCOUNTER — Other Ambulatory Visit: Payer: Self-pay | Admitting: Nurse Practitioner

## 2024-01-29 DIAGNOSIS — E1142 Type 2 diabetes mellitus with diabetic polyneuropathy: Secondary | ICD-10-CM

## 2024-02-06 ENCOUNTER — Encounter: Payer: Medicare HMO | Admitting: Nurse Practitioner

## 2024-02-07 NOTE — Progress Notes (Signed)
 This encounter was created in error - please disregard.

## 2024-02-09 ENCOUNTER — Ambulatory Visit: Payer: Self-pay | Admitting: Nurse Practitioner

## 2024-02-15 ENCOUNTER — Other Ambulatory Visit: Payer: Self-pay | Admitting: Nurse Practitioner

## 2024-02-19 ENCOUNTER — Ambulatory Visit: Admitting: Nurse Practitioner

## 2024-02-26 ENCOUNTER — Ambulatory Visit: Admitting: Nurse Practitioner

## 2024-03-04 ENCOUNTER — Ambulatory Visit: Admitting: Nurse Practitioner
